# Patient Record
Sex: Female | Born: 1945
Health system: Southern US, Community
[De-identification: ages and names within clinical notes are randomized; demographics above are authoritative.]

## PROBLEM LIST (undated history)

## (undated) DIAGNOSIS — F329 Major depressive disorder, single episode, unspecified: Secondary | ICD-10-CM

## (undated) DIAGNOSIS — C801 Malignant (primary) neoplasm, unspecified: Secondary | ICD-10-CM

## (undated) DIAGNOSIS — I10 Essential (primary) hypertension: Secondary | ICD-10-CM

## (undated) DIAGNOSIS — G629 Polyneuropathy, unspecified: Secondary | ICD-10-CM

## (undated) DIAGNOSIS — N904 Leukoplakia of vulva: Secondary | ICD-10-CM

## (undated) DIAGNOSIS — J449 Chronic obstructive pulmonary disease, unspecified: Secondary | ICD-10-CM

## (undated) DIAGNOSIS — T7840XA Allergy, unspecified, initial encounter: Secondary | ICD-10-CM

## (undated) DIAGNOSIS — F32A Depression, unspecified: Secondary | ICD-10-CM

## (undated) DIAGNOSIS — I7300 Raynaud's syndrome without gangrene: Secondary | ICD-10-CM

## (undated) DIAGNOSIS — R519 Headache, unspecified: Secondary | ICD-10-CM

## (undated) DIAGNOSIS — I00 Rheumatic fever without heart involvement: Secondary | ICD-10-CM

## (undated) DIAGNOSIS — M1990 Unspecified osteoarthritis, unspecified site: Secondary | ICD-10-CM

## (undated) DIAGNOSIS — E039 Hypothyroidism, unspecified: Secondary | ICD-10-CM

## (undated) DIAGNOSIS — R51 Headache: Secondary | ICD-10-CM

## (undated) DIAGNOSIS — E785 Hyperlipidemia, unspecified: Secondary | ICD-10-CM

## (undated) DIAGNOSIS — K219 Gastro-esophageal reflux disease without esophagitis: Secondary | ICD-10-CM

## (undated) HISTORY — DX: Rheumatic fever without heart involvement: I00

## (undated) HISTORY — DX: Hypothyroidism, unspecified: E03.9

## (undated) HISTORY — DX: Leukoplakia of vulva: N90.4

## (undated) HISTORY — DX: Hyperlipidemia, unspecified: E78.5

## (undated) HISTORY — DX: Major depressive disorder, single episode, unspecified: F32.9

## (undated) HISTORY — DX: Chronic obstructive pulmonary disease, unspecified: J44.9

## (undated) HISTORY — DX: Gastro-esophageal reflux disease without esophagitis: K21.9

## (undated) HISTORY — DX: Polyneuropathy, unspecified: G62.9

## (undated) HISTORY — DX: Raynaud's syndrome without gangrene: I73.00

## (undated) HISTORY — DX: Allergy, unspecified, initial encounter: T78.40XA

## (undated) HISTORY — PX: TONSILLECTOMY: SUR1361

## (undated) HISTORY — PX: SPINE SURGERY: SHX786

## (undated) HISTORY — DX: Depression, unspecified: F32.A

## (undated) HISTORY — DX: Essential (primary) hypertension: I10

---

## 1976-09-17 HISTORY — PX: ABDOMINAL HYSTERECTOMY: SHX81

## 1998-09-17 HISTORY — PX: BACK SURGERY: SHX140

## 2000-09-17 DIAGNOSIS — E039 Hypothyroidism, unspecified: Secondary | ICD-10-CM

## 2000-09-17 HISTORY — DX: Hypothyroidism, unspecified: E03.9

## 2001-04-07 ENCOUNTER — Other Ambulatory Visit: Admission: RE | Admit: 2001-04-07 | Discharge: 2001-04-07 | Payer: Self-pay | Admitting: *Deleted

## 2001-05-08 ENCOUNTER — Encounter: Payer: Self-pay | Admitting: Neurosurgery

## 2001-05-08 ENCOUNTER — Inpatient Hospital Stay (HOSPITAL_COMMUNITY): Admission: RE | Admit: 2001-05-08 | Discharge: 2001-05-11 | Payer: Self-pay | Admitting: Neurosurgery

## 2001-08-06 ENCOUNTER — Encounter: Admission: RE | Admit: 2001-08-06 | Discharge: 2001-08-06 | Payer: Self-pay | Admitting: Neurosurgery

## 2001-08-06 ENCOUNTER — Encounter: Payer: Self-pay | Admitting: Neurosurgery

## 2001-11-05 ENCOUNTER — Encounter: Admission: RE | Admit: 2001-11-05 | Discharge: 2001-11-05 | Payer: Self-pay | Admitting: Neurosurgery

## 2001-11-05 ENCOUNTER — Encounter: Payer: Self-pay | Admitting: Neurosurgery

## 2002-01-30 ENCOUNTER — Ambulatory Visit (HOSPITAL_COMMUNITY): Admission: RE | Admit: 2002-01-30 | Discharge: 2002-01-30 | Payer: Self-pay | Admitting: Neurosurgery

## 2002-01-30 ENCOUNTER — Encounter: Payer: Self-pay | Admitting: Neurosurgery

## 2002-02-01 ENCOUNTER — Encounter: Payer: Self-pay | Admitting: Neurosurgery

## 2002-02-10 ENCOUNTER — Inpatient Hospital Stay (HOSPITAL_COMMUNITY): Admission: RE | Admit: 2002-02-10 | Discharge: 2002-02-14 | Payer: Self-pay | Admitting: Neurosurgery

## 2002-09-17 HISTORY — PX: COLONOSCOPY: SHX174

## 2002-10-06 ENCOUNTER — Ambulatory Visit (HOSPITAL_COMMUNITY): Admission: RE | Admit: 2002-10-06 | Discharge: 2002-10-06 | Payer: Self-pay | Admitting: Gastroenterology

## 2003-09-24 ENCOUNTER — Other Ambulatory Visit: Admission: RE | Admit: 2003-09-24 | Discharge: 2003-09-24 | Payer: Self-pay | Admitting: Family Medicine

## 2006-09-24 ENCOUNTER — Ambulatory Visit: Payer: Self-pay | Admitting: Family Medicine

## 2006-11-26 ENCOUNTER — Ambulatory Visit: Payer: Self-pay | Admitting: Family Medicine

## 2007-03-31 ENCOUNTER — Ambulatory Visit: Payer: Self-pay | Admitting: Family Medicine

## 2007-04-08 ENCOUNTER — Emergency Department: Payer: Self-pay | Admitting: Unknown Physician Specialty

## 2007-04-08 ENCOUNTER — Ambulatory Visit: Payer: Self-pay | Admitting: Vascular Surgery

## 2007-04-18 LAB — HM COLONOSCOPY

## 2007-05-01 ENCOUNTER — Ambulatory Visit: Payer: Self-pay | Admitting: Family Medicine

## 2007-08-04 ENCOUNTER — Ambulatory Visit: Payer: Self-pay | Admitting: Family Medicine

## 2007-11-06 ENCOUNTER — Ambulatory Visit: Payer: Self-pay | Admitting: Family Medicine

## 2007-12-08 ENCOUNTER — Ambulatory Visit: Payer: Self-pay | Admitting: Family Medicine

## 2008-03-08 ENCOUNTER — Ambulatory Visit: Payer: Self-pay | Admitting: Family Medicine

## 2008-06-03 ENCOUNTER — Ambulatory Visit: Payer: Self-pay | Admitting: Family Medicine

## 2008-07-29 ENCOUNTER — Ambulatory Visit: Payer: Self-pay | Admitting: Family Medicine

## 2008-09-29 ENCOUNTER — Ambulatory Visit: Payer: Self-pay | Admitting: Family Medicine

## 2008-11-30 ENCOUNTER — Encounter: Admission: RE | Admit: 2008-11-30 | Discharge: 2008-11-30 | Payer: Self-pay | Admitting: Family Medicine

## 2008-11-30 ENCOUNTER — Ambulatory Visit: Payer: Self-pay | Admitting: Family Medicine

## 2008-12-14 ENCOUNTER — Ambulatory Visit: Payer: Self-pay | Admitting: Family Medicine

## 2009-01-18 ENCOUNTER — Ambulatory Visit: Payer: Self-pay | Admitting: Family Medicine

## 2009-08-22 ENCOUNTER — Ambulatory Visit: Payer: Self-pay | Admitting: Family Medicine

## 2009-10-07 ENCOUNTER — Ambulatory Visit: Payer: Self-pay | Admitting: Family Medicine

## 2009-10-21 ENCOUNTER — Emergency Department: Payer: Self-pay | Admitting: Emergency Medicine

## 2009-10-25 ENCOUNTER — Other Ambulatory Visit: Admission: RE | Admit: 2009-10-25 | Discharge: 2009-10-25 | Payer: Self-pay | Admitting: Family Medicine

## 2009-10-25 ENCOUNTER — Ambulatory Visit: Payer: Self-pay | Admitting: Family Medicine

## 2009-10-25 LAB — HM PAP SMEAR: HM Pap smear: NORMAL

## 2010-01-31 ENCOUNTER — Ambulatory Visit: Payer: Self-pay | Admitting: Family Medicine

## 2010-04-19 ENCOUNTER — Ambulatory Visit: Payer: Self-pay | Admitting: Physician Assistant

## 2011-01-06 ENCOUNTER — Encounter: Payer: Self-pay | Admitting: Family Medicine

## 2011-01-16 DIAGNOSIS — N904 Leukoplakia of vulva: Secondary | ICD-10-CM

## 2011-01-16 HISTORY — DX: Leukoplakia of vulva: N90.4

## 2011-01-26 ENCOUNTER — Other Ambulatory Visit: Payer: Self-pay | Admitting: Family Medicine

## 2011-01-31 ENCOUNTER — Ambulatory Visit (INDEPENDENT_AMBULATORY_CARE_PROVIDER_SITE_OTHER): Payer: Medicare Other | Admitting: Family Medicine

## 2011-01-31 ENCOUNTER — Telehealth: Payer: Self-pay | Admitting: *Deleted

## 2011-01-31 ENCOUNTER — Other Ambulatory Visit: Payer: Self-pay | Admitting: Family Medicine

## 2011-01-31 ENCOUNTER — Encounter: Payer: Self-pay | Admitting: Family Medicine

## 2011-01-31 VITALS — BP 124/84 | HR 80 | Ht 65.0 in | Wt 145.0 lb

## 2011-01-31 DIAGNOSIS — G629 Polyneuropathy, unspecified: Secondary | ICD-10-CM

## 2011-01-31 DIAGNOSIS — M1990 Unspecified osteoarthritis, unspecified site: Secondary | ICD-10-CM

## 2011-01-31 DIAGNOSIS — E785 Hyperlipidemia, unspecified: Secondary | ICD-10-CM | POA: Insufficient documentation

## 2011-01-31 DIAGNOSIS — G589 Mononeuropathy, unspecified: Secondary | ICD-10-CM

## 2011-01-31 DIAGNOSIS — M129 Arthropathy, unspecified: Secondary | ICD-10-CM

## 2011-01-31 DIAGNOSIS — I10 Essential (primary) hypertension: Secondary | ICD-10-CM

## 2011-01-31 DIAGNOSIS — Z780 Asymptomatic menopausal state: Secondary | ICD-10-CM

## 2011-01-31 DIAGNOSIS — E119 Type 2 diabetes mellitus without complications: Secondary | ICD-10-CM

## 2011-01-31 DIAGNOSIS — Z0000 Encounter for general adult medical examination without abnormal findings: Secondary | ICD-10-CM

## 2011-01-31 DIAGNOSIS — R079 Chest pain, unspecified: Secondary | ICD-10-CM

## 2011-01-31 DIAGNOSIS — E782 Mixed hyperlipidemia: Secondary | ICD-10-CM

## 2011-01-31 DIAGNOSIS — J449 Chronic obstructive pulmonary disease, unspecified: Secondary | ICD-10-CM

## 2011-01-31 DIAGNOSIS — N899 Noninflammatory disorder of vagina, unspecified: Secondary | ICD-10-CM

## 2011-01-31 DIAGNOSIS — E039 Hypothyroidism, unspecified: Secondary | ICD-10-CM

## 2011-01-31 DIAGNOSIS — Z79899 Other long term (current) drug therapy: Secondary | ICD-10-CM

## 2011-01-31 LAB — CBC WITH DIFFERENTIAL/PLATELET
Basophils Absolute: 0.1 10*3/uL (ref 0.0–0.1)
Basophils Relative: 1 % (ref 0–1)
Eosinophils Absolute: 0.1 10*3/uL (ref 0.0–0.7)
Eosinophils Relative: 1 % (ref 0–5)
MCH: 29.1 pg (ref 26.0–34.0)
MCHC: 33.8 g/dL (ref 30.0–36.0)
MCV: 86.3 fL (ref 78.0–100.0)
Neutrophils Relative %: 69 % (ref 43–77)
Platelets: 360 10*3/uL (ref 150–400)
RBC: 5.25 MIL/uL — ABNORMAL HIGH (ref 3.87–5.11)
RDW: 13.3 % (ref 11.5–15.5)

## 2011-01-31 LAB — COMPREHENSIVE METABOLIC PANEL
ALT: 18 U/L (ref 0–35)
AST: 21 U/L (ref 0–37)
Albumin: 4.4 g/dL (ref 3.5–5.2)
CO2: 22 mEq/L (ref 19–32)
Calcium: 10.3 mg/dL (ref 8.4–10.5)
Chloride: 100 mEq/L (ref 96–112)
Creat: 0.68 mg/dL (ref 0.40–1.20)
Potassium: 4.3 mEq/L (ref 3.5–5.3)

## 2011-01-31 LAB — LIPID PANEL
Cholesterol: 281 mg/dL — ABNORMAL HIGH (ref 0–200)
LDL Cholesterol: 193 mg/dL — ABNORMAL HIGH (ref 0–99)
Triglycerides: 232 mg/dL — ABNORMAL HIGH (ref <150)
VLDL: 46 mg/dL — ABNORMAL HIGH (ref 0–40)

## 2011-01-31 LAB — POCT URINALYSIS DIPSTICK
Bilirubin, UA: NEGATIVE
Glucose, UA: NEGATIVE
Leukocytes, UA: NEGATIVE
Nitrite, UA: NEGATIVE
Urobilinogen, UA: NEGATIVE

## 2011-01-31 LAB — TSH: TSH: 0.775 u[IU]/mL (ref 0.350–4.500)

## 2011-01-31 MED ORDER — CELECOXIB 200 MG PO CAPS: 200.0000 mg | ORAL_CAPSULE | Freq: Two times a day (BID) | ORAL | Status: DC

## 2011-01-31 MED ORDER — NIACIN ER (ANTIHYPERLIPIDEMIC) 500 MG PO TBCR: 500.0000 mg | EXTENDED_RELEASE_TABLET | Freq: Every day | ORAL | Status: DC

## 2011-01-31 MED ORDER — ALBUTEROL SULFATE HFA 108 (90 BASE) MCG/ACT IN AERS: 2.0000 | INHALATION_SPRAY | RESPIRATORY_TRACT | Status: DC | PRN

## 2011-01-31 MED ORDER — BUDESONIDE-FORMOTEROL FUMARATE 160-4.5 MCG/ACT IN AERO: 2.0000 | INHALATION_SPRAY | Freq: Two times a day (BID) | RESPIRATORY_TRACT | Status: DC

## 2011-01-31 NOTE — Progress Notes (Signed)
Subjective:    Patient ID: [name] Day, female    DOB: [birth year], 65 y.o.   MRN: [id number]  HPI [name] Day is a 65 y.o. female who presents for a complete physical.  She has the following concerns: Chest pain, and follow up on chronic medical problems as documented below:   Immunization History  Administered Date(s) Administered  . Influenza Whole 05/29/2008  . Pneumococcal Conjugate 10/07/2009   Td 1/05 Last Pap smear: 2011  Last mammogram: > 5 years Last colonoscopy: 1/08 (Dr. Loreta Ave) Last DEXA: never Eye exam: 2 years ago Dentist: goes regularly for cleaning.  Recurrent problems with losing crown   Diabetes follow-up:  Blood sugars at home are running 130's.  Denies hypoglycemia.  Denies polydipsia and polyuria.  Some urinary frequency at night (drinks a lot of fluids during day)  Last eye exam was 2 years ago.  Patient follows a low sugar diet and checks feet regularly without concerns.  Hypertension follow-up:  Blood pressures are not checked elsewhere.  Denies dizziness, headaches.  Denies side effects of medications.  Complaining of some chest tightness, like a squeezing, and "funny heartbeat" like it "flip flops" especially at night when she lies down.  Chest pain began when her mother was in hospice 2 weeks ago, and when she died.  Mother died Feb 09, 2023, husband died almost a year ago (anniversary coming up soon).  She thinks her heart symptoms are related to anxiety.  Was Rx'd alprazolam 0.25mg  last year--helps her.  Uses 1/2 tab twice daily, but not needing every day. Had stress test at [facility] in 2010 which was normal  Past Medical History  Diagnosis Date  . Diabetes mellitus   . Hypertension   . Allergy   . COPD (chronic obstructive pulmonary disease)   . Hypothyroid 2002  . GERD (gastroesophageal reflux disease)   . Depression   . Hyperlipidemia   . Neuropathy     diabetic    Past Surgical History  Procedure Date  . Colonoscopy 2004  .  Back surgery 2000  . Abdominal hysterectomy 1978    History   Social History  . Marital Status: Widowed    Spouse Name: N/A    Number of Children: N/A  . Years of Education: N/A   Occupational History  . 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 Smoking status: Former Smoker    Quit date: 09/17/1993  . Smokeless tobacco: Never Used  . Alcohol Use: No  . Drug Use: No  . Sexually Active: Not on file   Other Topics Concern  . Not on file   Social History Narrative  . No narrative on file    Family History  Problem Relation Age of Onset  . Heart disease Mother     CHF and cardiomyopathy  . Thyroid disease Mother   . Hyperlipidemia Mother   . Hyperlipidemia Father   . Cancer Father     liver  . Lupus Sister   . Hyperlipidemia Sister     Current outpatient prescriptions:albuterol (PROAIR HFA) 108 (90 BASE) MCG/ACT inhaler, Inhale 2 puffs into the lungs every 4 (four) hours as needed for wheezing., Disp: 1 Inhaler, Rfl: 2;  ALPRAZolam (XANAX) 0.25 MG tablet, Take 0.25 mg by mouth 3 (three) times daily as needed.  , Disp: , Rfl: ;  budesonide-formoterol (SYMBICORT) 160-4.5 MCG/ACT inhaler, Inhale 2 puffs into the lungs 2 (two) times daily., Disp: 1 Inhaler, Rfl:  11 celecoxib (CELEBREX) 200 MG capsule, Take 1 capsule (200 mg total) by mouth 2 (two) times daily. Take 200 mg by mouth 2 (two) times daily., Disp: 60 capsule, Rfl: 3;  FLUoxetine (PROZAC) 40 MG capsule, Take 40 mg by mouth daily.  , Disp: , Rfl: ;  levothyroxine (SYNTHROID, LEVOTHROID) 100 MCG tablet, TAKE 1 TABLET BY MOUTH EVERY DAY, Disp: 30 tablet, Rfl: 3 lisinopril-hydrochlorothiazide (PRINZIDE,ZESTORETIC) 10-12.5 MG per tablet, Take 1 tablet by mouth daily.  , Disp: , Rfl: ;  metFORMIN (GLUCOPHAGE) 500 MG tablet, Take 500 mg by mouth 1 dose over 46 hours.  , Disp: , Rfl: ;  metoprolol (TOPROL-XL) 100 MG 24 hr tablet, Take 100 mg by mouth daily.  , Disp: , Rfl: ;  Multiple  Vitamins-Minerals (SENIOR MULTIVITAMIN PLUS) TABS, Take 1 tablet by mouth.  , Disp: , Rfl:  DISCONTD: Albuterol Sulfate (PROAIR HFA IN), Inhale 2 puffs into the lungs as needed.  , Disp: , Rfl: ;  DISCONTD: budesonide-formoterol (SYMBICORT) 160-4.5 MCG/ACT inhaler, Inhale 2 puffs into the lungs 2 (two) times daily.  , Disp: , Rfl: ;  DISCONTD: celecoxib (CELEBREX) 200 MG capsule, Take 200 mg by mouth 2 (two) times daily. , Disp: , Rfl: ;  DISCONTD: Multiple Vitamin (MULTIVITAMIN PO), Take by mouth.  , Disp: , Rfl:  niacin (NIASPAN) 500 MG CR tablet, Take 1 tablet (500 mg total) by mouth at bedtime., Disp: 30 tablet, Rfl: 0;  DISCONTD: rosuvastatin (CRESTOR) 5 MG tablet, Take 5 mg by mouth daily.  , Disp: , Rfl:   Allergies  Allergen Reactions  . Aspirin Other (See Comments)    GI  bleed  . Statins Other (See Comments)    LEG & ARM CRAMPS  . Codeine Rash    Review of Systems The patient denies anorexia, fever, weight changes, headaches,  vision changes, decreased hearing, ear pain, sore throat, breast concerns, dizziness, syncope, dyspnea on exertion, cough, swelling, nausea, vomiting, diarrhea, constipation, abdominal pain, melena, hematochezia, indigestion/heartburn, hematuria, incontinence, dysuria,vaginal discharge, odor or itch, genital lesions, joint pains, numbness, tingling, weakness, tremor, suspicious skin lesions, depression,  abnormal bleeding/bruising, or enlarged lymph nodes.  + ROS: occ hemorrhoid flare, chest pain, anxiety    Objective:   Physical Exam BP 124/84  Pulse 80  Ht 5\' 5"  (1.651 m)  Wt 145 lb (65.772 kg)  BMI 24.13 kg/m2  General Appearance:    Alert, cooperative, no distress, appears stated age  Head:    Normocephalic, without obvious abnormality, atraumatic  Eyes:    PERRL, conjunctiva/corneas clear, EOM's intact, fundi    benign  Ears:    Normal TM's and external ear canals  Nose:   Nares normal, mucosa normal, no drainage or sinus   tenderness  Throat:    Lips, mucosa, and tongue normal; teeth and gums normal  Neck:   Supple, no lymphadenopathy;  thyroid:  no   enlargement/tenderness/nodules; no carotid   bruit or JVD  Back:    Spine nontender, no curvature, ROM normal, no CVA     tenderness  Lungs:     Clear to auscultation bilaterally without wheezes, rales or     ronchi; respirations unlabored  Chest Wall:    No tenderness or deformity   Heart:    Regular rate and rhythm, S1 and S2 normal, no murmur, rub   or gallop  Breast Exam:    No tenderness, masses, or nipple discharge or inversion.      No axillary lymphadenopathy  Abdomen:  Soft, non-tender, nondistended, normoactive bowel sounds,    no masses, no hepatosplenomegaly  Genitalia:    Perineal area with hypo pigmentation, and some thickening with area of recent bleed posteriorly.  Normal bimanual exam--no palpable masses (uterus and adnexa absent)  Rectal:    Normal tone, no masses or tenderness; guaiac negative stool  Extremities:   No clubbing, cyanosis or edema  Pulses:   2+ and symmetric all extremities  Skin:   Skin color, texture, turgor normal, no rashes or lesions. Some callouses noted bilaterally, and decreased sensation to monofilament in stocking pattern--sensation spared at the heels, absent at toes through mid foot  Lymph nodes:   Cervical, supraclavicular, and axillary nodes normal  Neurologic:   CNII-XII intact, normal strength, and gait; reflexes 2+ and symmetric throughout. Neuropathy as per above          Psych:   Normal mood, affect, hygiene and grooming.          Assessment & Plan:   1. Routine general medical examination at a health care facility  POCT urinalysis dipstick, POCT HgB A1C, Vitamin D 25 hydroxy, EKG, Visual acuity screening  2. Type II or unspecified type diabetes mellitus without mention of complication, not stated as uncontrolled  POCT HgB A1C, Comprehensive metabolic panel, Urine Microalbumin w/creat. ratio   controlled  3. COPD (chronic  obstructive pulmonary disease)  CBC with Differential, budesonide-formoterol (SYMBICORT) 160-4.5 MCG/ACT inhaler, albuterol (PROAIR HFA) 108 (90 BASE) MCG/ACT inhaler   stable  4. Mixed hyperlipidemia  Lipid panel   intolerant of statins, Zetia. Tolerated Niacin, but requires ASA to prevent side effect--pt with h/o ulcers from ASA, not on PPI  5. Essential hypertension, benign     controlled  6. Unspecified hypothyroidism  TSH  7. Encounter for long-term (current) use of other medications  Comprehensive metabolic panel  8. Arthritis  celecoxib (CELEBREX) 200 MG capsule  9. Chest pain  EKG   normal stress test 08/2009; EKG unchanged from 08/2009, showing some LAE.  Likely related to anxiety, not CAD  10. Postmenopausal  DG Bone Density  11. Vaginal disorder  Ambulatory referral to Obstetrics / Gynecology   perineal skin is concerning--refer to GYN for eval/treatment (and/or biopsy)  12. Neuropathy  Ambulatory referral to Podiatry   refer to podiatry in this diabetic patient with loss of sensation in feet, and callous formation   Hyperlipidemia--hasn't tolerated many statins or Zetia in past.  Never tried Welchol.  Has tried Niaspan and tolerated, but needs to take aspirin with in order to prevent side effects.  H/o bleeding ulcer with aspirin in the past.  Recommend re-start niaspan, take coated aspirin prior, and to start PPI on a daily basis to prevent ulcers from the aspirin (ie. Prilosec OTC or Prevacid).  Stop if develops abdominal pain, blood in stool.  Consider higher dose PPI if having some GI side effects  Discussed monthly self breast exams and yearly mammograms after the age of 40--strongly encouraged her to schedule mammo (past due); at least 30 minutes of aerobic activity at least 5 days/week; proper sunscreen use reviewed; healthy diet, including goals of calcium and vitamin D intake and alcohol recommendations (less than or equal to 1 drink/day) reviewed; regular seatbelt use;  changing batteries in smoke detectors.  Immunization recommendations discussed. She had shingles last year, has had twice.  Recommend vaccine at pharmacy--rx written. Colonoscopy recommendations reviewed--likely due again next year  Visit length: over an hour

## 2011-01-31 NOTE — Telephone Encounter (Signed)
[facility] OZ:[id number] for DEXA. Representative stated that the current DX listed is not one that insurance will pay for, she states that [id number], menopausal, osteopenia or osteoporosis will work. Can you change this in the order please? Thanks so much!

## 2011-01-31 NOTE — Telephone Encounter (Signed)
I don't know how to change dx in the referral.  I added "postmenopausal" to visit diagnoses in encounter, but couldn't figure out how to change in referral.  Please change to this dx for her DEXA.  Thanks!

## 2011-02-01 ENCOUNTER — Other Ambulatory Visit: Payer: Self-pay | Admitting: *Deleted

## 2011-02-01 ENCOUNTER — Telehealth: Payer: Self-pay | Admitting: *Deleted

## 2011-02-01 DIAGNOSIS — Z1231 Encounter for screening mammogram for malignant neoplasm of breast: Secondary | ICD-10-CM

## 2011-02-01 DIAGNOSIS — E119 Type 2 diabetes mellitus without complications: Secondary | ICD-10-CM

## 2011-02-01 LAB — MICROALBUMIN / CREATININE URINE RATIO

## 2011-02-01 NOTE — Telephone Encounter (Signed)
Called pt, no answer.left message for her to return my call re:lab results. vs

## 2011-02-01 NOTE — Progress Notes (Addendum)
microlbumin sent to lab 02/01/11, not sent on 01/31/11 due to lab error

## 2011-02-01 NOTE — Telephone Encounter (Signed)
Spoke with patient she has started her niaspan 500 mg as of yesterday. She is sch for lab only appt fasting 03/02/11 at 10:00am

## 2011-02-02 LAB — MICROALBUMIN / CREATININE URINE RATIO
Creatinine, Urine: 88.4 mg/dL
Microalb Creat Ratio: 5.7 mg/g (ref 0.0–30.0)
Microalb, Ur: 0.5 mg/dL (ref 0.00–1.89)

## 2011-02-02 NOTE — Discharge Summary (Signed)
[place]  Patient:    [name], [name] Visit Number: [id number] MRN: [id number]          Service Type: Attending:  Clydene Fake, M.D. Dictated by:   Clydene Fake, M.D. Adm. Date:  05/08/01 Disc. Date: 05/11/01                             Discharge Summary  ADMISSION DIAGNOSIS:  Spondylolisthesis, L5-S1, and lateral recess stenosis.  DISCHARGE DIAGNOSIS:  Spondylolisthesis, L5-S1, and lateral recess stenosis.  PROCEDURES:  Gill decompressive laminectomy at L5, posterior interbody fusion of L5-S1, ______ interbody cages, pedicle screw fixation at L5-S1, open reduction and internal fixation of spondylolisthesis, posterolateral fusion of L5-S1, and autograft of same incision.  REASON FOR ADMISSION:  The patient is a 65 year old woman who since September of 1999 has been having severe back and leg problems, but has been gradually getting worse over the period of time.  The back pain is severe and fairly constant and worsening with any activity or sitting or standing too long. Those activities along bring leg pain worse on the left side running down the S1 distribution.  X-rays with flexion and extension show grade 1-2 spondylolisthesis with bilateral pars defects.  No clear abnormal motion for flexion and extension.  There is progression of spondylolisthesis over time. MRI shows the disk collapse, the pseudodisk herniation, and the lateral recess stenosis with severe foraminal narrowing.  The patient was brought in for decompression and fusion.  HOSPITAL COURSE:  The patient was admitted on the day of surgery and underwent the procedures above without complications.  Noted intraoperatively a large disk herniation that was seen on the left side compressing the S1 root which was quite significant for her S1 component of pain.  Postoperatively, she was transferred to the recovery room and then to the floor.  She has done extremely well.   Right away it was noted that she had no leg problems.  Back pain has always fairly moderate and well controlled with a PCA for the first couple of days and now minimal pain pills after that.  She has been up ambulating and doing extremely well getting the brace on and off.  The incisions remain dry and intact.  She had a little postoperative ileus.  That has resolved with positive flatus.  She still has had no bowel movement, but is eating well.  DISPOSITION:  The patient will be discharged home in stable condition.  DISCHARGE MEDICATIONS:  Same as prehospitalization, except no nonsteroidal anti-inflammatories, Percocet one to two p.o. q.4-6h. p.r.n. (51 pills given), and Flexeril 250 mg p.o. q.8h. p.r.n. spasms.  DIET:  As tolerated.  ACTIVITY:  Keep the incision dry for five days.  Will keep the brace on when up.  No strenuous activity.  FOLLOW-UP:  Will be in three weeks in my office. Dictated by:   Clydene Fake, M.D. Attending:  Clydene Fake, M.D. DD:  05/11/01 TD:  05/12/01 Job: [id number]

## 2011-02-02 NOTE — Op Note (Signed)
[place]. [facility]  Patient:    [name] Day, [name] Day Visit Number: [id number] MRN: [id number]          Service Type: SUR Location: [id number] Attending Physician:  Colon Branch Proc. Date: 05/08/01 Adm. Date:  05/08/2001                             Operative Report  PREOPERATIVE DIAGNOSES:  Spondylolisthesis at L5-S1, lateral recess stenosis.  POSTOPERATIVE DIAGNOSES:  Spondylolisthesis at L5-S1, lateral recess stenosis.  PROCEDURES:  Gill-type decompressive laminectomy at L5, posterior lumbar interbody fusion at L5-S1, Brantigan interbody cages at L5-S1, pedicle screw fixation (nonsegmental) at L5-S1, open reduction and internal fixation of the spondylolisthesis, posterolateral fusion L5-S1, autograft same incision, and allograft.  SURGEON:  Clydene Fake, M.D.  ASSISTANT:  Izell Bath. Elesa Hacker, M.D.  ANESTHESIA:  General endotracheal tube anesthesia.  ESTIMATED BLOOD LOSS:  300 cc.  BLOOD GIVEN:  None.  DRAINS:  None.  COMPLICATIONS:  None.  REASON FOR PROCEDURE:  Patient is a 65 year old woman who since 1999 has been having some back and leg pain that has been worsening over time.  Back is severe and pretty much constant, worsening with any types of activity, and leg pain seems to come on with activity.  MRI and x-rays were done showing grade 1-2 spondylolisthesis at L5-S1.  There is not much _____ with flexion-extension, but things have worsened over the years.  MRI was done showing the spondylolisthesis, disk herniation, and severe foraminal and lateral stenosis due to the spondylolisthesis.  Patient brought in for decompression and fusion.  DESCRIPTION OF PROCEDURE:  The patient was brought in the operating room, and general anesthesia was induced.  Patient was placed in a prone position on the Wilson frame with all pressure points padded.  The patient was prepped and draped in a sterile fashion, and site of incision was injected with  20 cc 1% lidocaine with epinephrine.  Incision was then made from L4 to S1 spinous process and the incision taken down to the fascia.  Fascia was incised with the Bovie, and subperiosteal dissection was done over the L4, 5, and S1 spinous processes and laminae out to the facets bilaterally.  Fluoroscopic imaging was used to confirm all levels.  Dissection was taken out lateral so that the L5 transverse process was found bilaterally along with getting it to the lateral space beyond the 5-1 facet.  Again after confirming levels with fluoroscopy, an L5 decompressive laminectomy was done.  his was a Gill-type procedure, due to the spondylolisthesis and the lateral pars defect, removing that whole superior facet bilaterally, having decompressed the soft tissue under the four laminae and also took off the top of the sacral lamina.  We took dissection out laterally, and getting the hypertrophic ligament and bone spurring and opened up the foramen over the 5 roots bilaterally.  The 5 roots were extremely pinned in.  The foramen was very tight.  Medial facetectomy was then performed on the inferior facets, uncovering the lateral disk space. Disk space was then incised bilaterally and diskectomy performed with pituitary rongeurs.  There was a large disk herniation seen on the left side, which was pushing into the S1 root on that side.  We then used interspace spreaders to start spreading the 5-1 interspace, and we distracted up to 11 mm, and this reduced greatly the spondylolisthesis.  We continued working on the diskectomy, and then we used the  [place] cage set for the shavers and then the progressive reamer was used bilaterally to prepare the interspace for cages.  Thirteen high by 11 wide by 21 mm long Brantigan cages were then prepared.  The bone that was removed during the laminectomy was cleaned, chopped up into small pieces.  To this [place] putty was added.  This mixture was then packed  into the [place] cages.  This bone mixture was placed into the disk space anteriorly, and then the cages were tapped into place, first on the right and then the left.  Fluoroscopic imaging was used to confirm the positioning and was used during the use of the broaches at this point as the drill was used to decorticate the L5 transverse process and lateral facet along with the lateral facet of S1 and the top of the sacrum laterally on both sides.  The entry points for the L5 and S1 pedicles were found and decorticated with the high-speed drill.  The pedicle probe was then placed down the L5 pedicle, first on the left, then on the right, using fluoroscopic imaging as a guide.  The hole was tapped and then pedicle screws were placed in the L5 level, 40 mm long x 6.25 wide Monarch variable-axis screws were placed.  The S1 pedicles were found with the probe and then tapped, the screws placed just like L5, though 7 mm wide x 30 mm long screws were used.  Two 45 mm rods were prelordosed, were placed into the screw heads, one on each side, and the locking nuts were placed.  We tightened the L5 tightening screw over the rod and then with compression over the 5-1 interspace, then tightened the tightening screw over S1.  We finally tightened all these screws.  AP and lateral fluoroscopic imaging was obtained showing good position of the pedicle screws, rods, interbody cages, and good disk space restoration and reduction of the spondylolisthesis.  The wound was irrigated with antibiotic solution, and then the rest of the bone mixture along with 10 cc of [place] allograft bone was then placed into the lateral gutters over the L5 transverse process, down to the lateral sacrum bilaterally for a posterolateral fusion.  Gelfoam and thrombin was placed in the epidural space, retractors were removed, hemostasis obtained with Bovie cauterization, and the paraspinous muscles and fascia were each closed with 0  Vicryl interrupted sutures.  The wound was irrigated with antibiotic solution some more, and then 0, 2-0, and 3-0 Vicryl interrupted sutures were used to close the subcutaneous tissue.  Skin was  closed with Steri-Strips.  A dressing was placed.  Patient was placed back into a supine position, awoken from anesthesia, and transferred to the recovery room in stable condition. Attending Physician:  Colon Branch DD:  05/08/01 TD:  05/09/01 Job: [id number]

## 2011-02-02 NOTE — Op Note (Signed)
NAME:  [name] Day, [name] Day                      ACCOUNT NO.:  [id number]   MEDICAL RECORD NO.:  [id number]                   PATIENT TYPE:  AMB   LOCATION:  ENDO                                 FACILITY:  [facility]   PHYSICIAN:  Anselmo Rod, M.D.               DATE OF BIRTH:  04/11/1946   DATE OF PROCEDURE:  10/06/2002  DATE OF DISCHARGE:                                 OPERATIVE REPORT   PROCEDURE PERFORMED:  Colonoscopy.   ENDOSCOPIST:  Charna Elizabeth, M.D.   INSTRUMENT USED:  Pediatric adjustable Olympus colonoscope.   INDICATIONS FOR PROCEDURE:  The patient is a 65 year old white female with a  family history of colon cancer in her mother and personal history of rectal  bleeding.  Rule out colonic polyps, masses, etc.   PREPROCEDURE PREPARATION:  Informed consent was procured from the patient.  The patient was fasted for eight hours prior to the procedure and prepped  with a bottle of Gatorade and a bottle of MiraLax the night prior to the  procedure.   PREPROCEDURE PHYSICAL:  The patient had stable vital signs.  Neck supple.  Chest clear to auscultation.  S1 and S2 regular.  Abdomen soft with normal  bowel sounds.   DESCRIPTION OF PROCEDURE:  The patient was placed in left lateral decubitus  position and sedated with 60 mg of Demerol and 10 mg of Versed  intravenously.  Once the patient was adequately sedated and maintained on  low flow oxygen and continuous cardiac monitoring, the Olympus video  colonoscope was advanced from the rectum to the cecum with difficulty.  There were small internal hemorrhoids seen on retroflexion and a few left-  sided diverticula appreciated.  The rest of the colonic mucosa appeared  healthy and normal.  The appendiceal orifice and ileocecal valve were  clearly visualized and photographed.  The patient had some hives on her hand  after injection of Demerol and Versed and therefore the patient was switched  to Fentanyl 25 mcg given intravenous  in addition to the Demerol previously  given to her and Benadryl 25 mg was given as well.   IMPRESSION:  1. Small nonbleeding internal hemorrhoids.  2. A few left-sided diverticula.  3. Normal-appearing proximal left colon, transverse colon and right colon     including cecum.   RECOMMENDATIONS:  1. A high fiber diet is recommended for the patient.  2.     Outpatient follow-up in the next two weeks for further recommendation.  3. Repeat colorectal cancer screening in the next five years unless the     patient develops any abnormal symptoms in the interim.                                                   Jyothi Nat  Loreta Ave, M.D.    JNM/MEDQ  D:  10/06/2002  T:  10/06/2002  Job:  [id number]   cc:   Talmadge Coventry, M.D.  [address]  [place]  [place] [address]  Fax: [phone]

## 2011-02-02 NOTE — H&P (Signed)
. [facility]  Patient:    [name] Day, [name] Day Visit Number: [id number] MRN: [id number]          Service Type: Attending:  Clydene Fake, M.D. Adm. Date:  05/08/01                           History and Physical  DATE OF BIRTH:  [birth year]  CHIEF COMPLAINT:  Back and leg pain.  HISTORY OF PRESENT ILLNESS:  The patient is a 65 year old, right-handed woman who since September of 1999 has been having back and leg problems. It has been worsening through this period of time. Back pain is severe and pretty constant with pain radiating down into the legs towards the heel and bottom of the foot. Sitting or standing too long or doing any type of activity makes the pain worse. She has been on multiple anti-inflammatories with no relief. She has been on various pain medications and physical therapy but currently is having problems doing the exercises with the increased pain. No bowel or bladder incontinence. She does have numbness that goes down the legs also. Workup has included x-rays with flexion and extension and MRI of the lumbar spine. These show a grade 1-2 spondylolithesis at 5-1 with bilateral pars defects with no clear abnormal motion there but pretty good disk collapse. There is some lateral stenosis due to the spondylolithesis and foraminal narrowing. The patient will be admitted for decompression and fusion.  PAST MEDICAL HISTORY:  Significant for hypertension, thyroid disease, arthritis, increased cholesterol, and depression.  PAST SURGICAL HISTORY:  Tonsillectomy in 1962, hysterectomy in 1976.  CURRENT MEDICATIONS: 1. Trazodone 150 mg q.h.s. 2. Levoxyl 0.175 mg q.d. 3. Norvasc 10 mg q.d. 4. Cozaar 25 mg q.d. 5. Prozac 20 mg q.o.d. 6. Premarin 0.625 mg q.d. 7. Hydrochlorothiazide 25 mg q.d. 8. Ultram p.r.n.  ALLERGIES:  No known drug allergies.  SOCIAL HISTORY:  She says she does not smoke or drink alcohol. She is married. She is  a Futures trader.  REVIEW OF SYSTEMS:  Otherwise negative.  FAMILY HISTORY:  Noncontributory.  PHYSICAL EXAMINATION:  GENERAL:  The patient is in mild distress.  VITAL SIGNS:  Weight is 142, height 5 feet 6 inches, blood pressure 162/101, pulse 69, temperature 97.7.  HEENT:  Unremarkable.  NECK:  Supple.  LUNGS:  Clear.  HEART:  Regular rate and rhythm.  ABDOMEN:  Soft, nontender.  EXTREMITIES:  Intact with no edema.  BACK AND NEUROLOGICAL:  Range of motion of back is decreased in flexion and extension. It is worse in extension with increasing back and leg pain. She does have some mild paraspinous muscle tenderness. No clear spasms. No tenderness over the hip bursa. Gait is down with a stiff back. Straight leg raising is positive on the right and left with the unilateral S1 radicular symptoms. Motor strength shows 5/5 in all muscle groups. Sensation is slightly decreased in the S1 distribution to light touch and pinprick, a little worse on the left side than on the right. L5 may be slightly decreased in pinprick also. Otherwise intact. Deep tendon reflexes are at 3 at the knees, and ankles equal bilaterally.  ASSESSMENT AND PLAN:  The patient with grade 1 to 2 spondylolithesis at L5-S1 with biforaminal narrowing and lateral stenosis. The patient will be admitted for decompressive lumbar laminectomy and posterior lumbar interbody fusion with pedicle screws and interbody cages. Attending:  Clydene Fake, M.D.  DD:  05/07/01  TD:  05/07/01 Job: [id number]

## 2011-02-02 NOTE — Op Note (Signed)
[place]. [facility]  Patient:    GLENNIE, BOSE Visit Number: [id number] MRN: [id number]          Service Type: SUR Location: 3000 [id number] Attending Physician:  Colon Branch Dictated by:   Clydene Fake, M.D. Proc. Date: 02/10/02 Admit Date:  02/10/2002                             Operative Report  PREOPERATIVE DIAGNOSIS:  Pseudomeningocele with cauda equina compression.  POSTOPERATIVE DIAGNOSES: 1. Pseudomeningocele with cauda equina compression. 2. Intradural cyst (possible arachnoid _____ part of the pseudomeningocele.  PROCEDURES: 1. L3-5 laminectomy with drainage of pseudomeningocele. 2. Intradural resection of the cyst with decompression of cauda equina. 3. Primary dural closure with suture and fibrin glue. 4. Microdissection with microscope.  SURGEON:  Clydene Fake, M.D.  ASSISTANT:  Izell Milligan. Elesa Hacker, M.D.  ANESTHESIA:  General endotracheal tube anesthesia.  ESTIMATED BLOOD LOSS:  100 cc.  BLOOD GIVEN:  None.  DRAINS:  None.  COMPLICATIONS:  None.  REASON FOR PROCEDURE:  The patient is a 65 year old woman who is one year status post L5-S1 fusion, who has had sacral and perineal pain, maybe some urgency on urination and increased frequency.  She had a myelogram performed showing cauda equina compression from it looks like a pseudomeningocele preop. The patient is brought for decompression and repair.  DESCRIPTION OF PROCEDURE:  The patient was brought in the operating room, and general anesthesia was induced.  The patient was placed in a prone position no the Wilson frame with all pressure points padded.  The site of incision was injected with 10 cc of 1% lidocaine with epinephrine.  The incision was made then at the site of the previous incision plus extended cephalad 2-3 cm.  The incision taken down through the fascia and hemostasis obtained with Bovie cauterization.  The spinous processes of 3 and 4 were palpated,  and subperiosteal dissection was done at the L3 and L4 spinous processes and as we got to the 4, we hit the pseudomeningocele and spinal fluid was seen.  We then dissected down through the pseudomeningocele cavity down to the dura.  There was a hole going down under the L4 lamina.  The right side of L3 and all the L4 laminectomy was then performed with Leksell rongeurs and Kerrison punches. Under the lamina looked like we had normal dura, and we opened this dura and were looking at a cystic cavity that surrounded and compressed all the nerve roots of the cauda equina.  As we extended our dural incision cephalad, we then actually got through into the normal subarachnoid space.  We simply had a cyst, intradural pseudomeningocele, or arachnoid cyst that closed off and was compressing the nerve roots.  This cyst had an inferior border, and we opened the dura down over the sacral area until we found the caudal aspect of the cyst.  We then opened up the cyst at the cephalad and caudal areas and peeled away a lot of the cyst wall.  When we did this, we peeled away and opened the cyst over the nerve roots and as we did that, the nerve roots spread out and started coming into the field.  We were not able to peel all the cyst wall away because we wanted to protect the nerve root function, so we made sure we opened up all the compression, and all nerve roots again were decompressed.  We then addressed the cephalad-caudal areas of the cyst, making sure we adequately removed enough of the cyst wall so that we could try to stop this from reforming.  We then closed the dura with 4-0 Nurolon running suture.  The wound was irrigated with antibiotic solution.  Dura was coated with Surgicel and then fibrin glue.  Retractors were removed.  Paraspinous muscles were closed with 0 Vicryl interrupted sutures, the fascia was closed with 0 Ethibond running locking suture, and the subcutaneous tissue was closed  with 0, 2-0, and 3-0 Vicryl interrupted suture and the skin closed with benzoin and Steri-Strips.  Note:  The microscope was used for microdissection to decompress all the nerve roots and peel away cyst wall in what was a fairly tedious procedure.  After the benzoin and Steri-Strips placed, dressing placed.  The patient was placed back in a supine position, awoken from anesthesia, and transferred to the recovery room. Dictated by:   Clydene Fake, M.D. Attending Physician:  Colon Branch DD:  02/10/02 TD:  02/11/02 Job: [id number]

## 2011-02-02 NOTE — H&P (Signed)
[place]. [facility]  Patient:    [name] Day, [name] Day Visit Number: [id number] MRN: [id number]          Service Type: SUR Location: 3000 3036 01 Attending Physician:  Colon Branch Dictated by:   Clydene Fake, M.D. Admit Date:  02/10/2002                           History and Physical  CHIEF COMPLAINT:  Sacral and perineal pain with urinary frequency.  HISTORY:  The patient is a 65 year old woman who is one year status post L5-S1 fusion with pedicle screws and instrumentation, again one year ago.  She has done great until the last few months, noticing severe sacral pain that radiates into the perineal area and has also noticed some frequency of urination and kind of urgency with bowel movements, though she has not been incontinent of either.  MRI was done showing a near-myelographic block at L4-5 with a late filling of the surrounding area and CT was done and this shows a pseudomeningocele at this level that does compress all the nerve roots of the cauda equina.  The patient is brought in for decompression and resection and repair of the pseudomeningocele.  PAST MEDICAL HISTORY:  Significant for hypertension, thyroid disease, arthritis, increased cholesterol, depression.  PAST SURGICAL HISTORY:  Past surgery includes the lumbar laminectomy and fusion as mentioned above, tonsillectomy in 1962, hysterectomy in 1976.  CURRENT MEDICATIONS:  Premarin, Cozaar, Norvasc, hydrochlorothiazide, Levoxyl, fluoxetine, vitamin E, atenolol, multivitamin, Neurontin.  DRUG ALLERGIES:  CODEINE and LATEX.  SOCIAL HISTORY:  She does not smoke or drink alcohol.  She is married.  REVIEW OF SYSTEMS:  Otherwise negative.  FAMILY HISTORY:  Otherwise negative.  PHYSICAL EXAMINATION:  GENERAL:  The patient is pleasant, in no apparent distress.  HEENT:  Unremarkable.  NECK:  Supple.  LUNGS:  Clear.  HEART:  Regular rhythm.  ABDOMEN:  Soft and  nontender.  EXTREMITIES:  Intact.  No edema.  BACK AND NEUROLOGIC:  Exam shows negative straight leg raising, motor strength intact, sensation intact, gait normal, good range of motion of the back, well-healed incision posteriorly.  ASSESSMENT AND PLAN:  A patient with pseudomeningocele causing cauda equina compression.  The patient will be admitted for a decompression and fusion. Dictated by:   Clydene Fake, M.D. Attending Physician:  Colon Branch DD:  02/10/02 TD:  02/11/02 Job: [id number]

## 2011-02-02 NOTE — Discharge Summary (Signed)
[place]. [facility]  Patient:    [name] Day, [name] Day Visit Number: [id number] MRN: [id number]          Service Type: SUR Location: 3000 3036 01 Attending Physician:  Colon Branch Dictated by:   Cristi Loron, M.D. Admit Date:  02/10/2002 Discharge Date: 02/14/2002                             Discharge Summary  For full details of this admission, please refer to the typed history and physical.  HISTORY OF PRESENT ILLNESS:  The patient is a 65 year old woman who is one year status post an L5-S1 fusion who has sacral and peroneal pain with some urgency on urination, increased frequency of urination.  She had a myelogram which demonstrated cauda equina compression from a pseudomeningocele.  The patient was brought in for decompression and repair.  For full details of this admission, please refer to the typed history and physical.  HOSPITAL COURSE:  Dr. Phoebe Perch admitted the patient to [facility] on Feb 10, 2002, with a diagnosis of pseudomeningocele and cauda equina compression with intradural cyst.  On the day of admission he performed an L3 to L5 laminectomy with drainage of pseudomeningocele and intradural resection of the cyst with decompression of the cauda equina and primary dural closure with suture and fibrin glue microdissection using microscope.  (For further details of this operation, please refer to the typed operative note).  POSTOPERATIVE COURSE:  The patients postoperative course was essentially unremarkable.  She remained flat for a few days and then was allowed to get up out of the bed.  By Feb 14, 2002, the patient was afebrile, vital signs stable.  She was eating well, ambulating well.  She had no headache, and she was requesting discharge to home.  She did complain of some oral thrush which she got on a previous operation and requested medication for this.  DISCHARGE INSTRUCTIONS:  The patient was given written  discharge instructions. She was instructed to resume her outpatient medical regimen.  FOLLOW-UP:  Instructed to follow up with Dr. Phoebe Perch.  DISCHARGE MEDICATIONS:  Discharge prescriptions are: 1. Lortab 10 #60 1 p.o. q.4h. p.r.n. for pain, no refill. 2. Mycostatin solution 5 cc swish and swallow t.i.d. x 3 days. 3. Valium 5 mg #50 1 p.o. q.6h. p.r.n. for muscle spasms.  FINAL DIAGNOSES: 1. Pseudomeningocele with cauda equina compression. 2. Intradural cyst. 3. Possibly arachnoid cyst.  PROCEDURES:  L3 to L5 laminectomy with drainage of pseudomeningocele, intradural resection of the cyst, decompression of the cauda equina, primary closure with microdissection. Dictated by:   Cristi Loron, M.D. Attending Physician:  Colon Branch DD:  02/14/02 TD:  02/16/02 Job: [id number]

## 2011-02-05 ENCOUNTER — Other Ambulatory Visit: Payer: Self-pay | Admitting: Family Medicine

## 2011-02-20 ENCOUNTER — Other Ambulatory Visit: Payer: Self-pay | Admitting: Family Medicine

## 2011-02-21 ENCOUNTER — Ambulatory Visit
Admission: RE | Admit: 2011-02-21 | Discharge: 2011-02-21 | Disposition: A | Discharge status: Home / Self Care | Payer: Medicare Other | Source: Ambulatory Visit | Attending: Family Medicine | Admitting: Family Medicine

## 2011-02-21 DIAGNOSIS — Z1231 Encounter for screening mammogram for malignant neoplasm of breast: Secondary | ICD-10-CM

## 2011-02-21 DIAGNOSIS — Z780 Asymptomatic menopausal state: Secondary | ICD-10-CM

## 2011-02-22 ENCOUNTER — Telehealth: Payer: Self-pay | Admitting: *Deleted

## 2011-02-22 NOTE — Telephone Encounter (Signed)
Called patient to inform her that dexa results were abnormal. Pt sch appt with DrKnapp for 03/01/11 at 10:30. Pt has lab visit sch for 03/02/11, so i told ger we could draw them when she came in for the OV on the 14th.

## 2011-03-01 ENCOUNTER — Ambulatory Visit (INDEPENDENT_AMBULATORY_CARE_PROVIDER_SITE_OTHER): Payer: Medicare Other | Admitting: Family Medicine

## 2011-03-01 ENCOUNTER — Encounter: Payer: Self-pay | Admitting: Family Medicine

## 2011-03-01 VITALS — BP 130/80 | HR 72 | Ht 64.5 in | Wt 144.0 lb

## 2011-03-01 DIAGNOSIS — E782 Mixed hyperlipidemia: Secondary | ICD-10-CM

## 2011-03-01 DIAGNOSIS — M899 Disorder of bone, unspecified: Secondary | ICD-10-CM

## 2011-03-01 DIAGNOSIS — M949 Disorder of cartilage, unspecified: Secondary | ICD-10-CM

## 2011-03-01 DIAGNOSIS — M8580 Other specified disorders of bone density and structure, unspecified site: Secondary | ICD-10-CM

## 2011-03-01 DIAGNOSIS — Z79899 Other long term (current) drug therapy: Secondary | ICD-10-CM

## 2011-03-01 LAB — LIPID PANEL
Cholesterol: 245 mg/dL — ABNORMAL HIGH (ref 0–200)
VLDL: 46 mg/dL — ABNORMAL HIGH (ref 0–40)

## 2011-03-01 LAB — HEPATIC FUNCTION PANEL
ALT: 29 U/L (ref 0–35)
Bilirubin, Direct: 0.1 mg/dL (ref 0.0–0.3)
Indirect Bilirubin: 0.4 mg/dL (ref 0.0–0.9)

## 2011-03-01 MED ORDER — IBANDRONATE SODIUM 150 MG PO TABS: 150.0000 mg | ORAL_TABLET | ORAL | Status: DC

## 2011-03-01 NOTE — Progress Notes (Signed)
Subjective:    Patient ID: [name] Day, female    DOB: 1946/07/02, 65 y.o.   MRN: [id number]  HPI Patient presents to discuss bone density results.  Hyperlipidemia follow-up:  Patient was unable to tolerate the Niaspan, even with 325mg  of aspirin, felt like her body was burning up, skin was very red, felt like hot needles.  She restarted Crestor 10mg  (she had samples at home), and has been taking this for about 4 weeks without any apparent side effects. Patient is reportedly following a low-fat, low cholesterol diet.  Patient is fasting today for repeat labs.  Patient saw the gynecologist.  Felt that she likely has lichen sclerosis, and she was given a steroid cream.  She is to follow up with them in July to see if there is improvement, versus needing a biopsy.  Past Medical History  Diagnosis Date  . Diabetes mellitus   . Hypertension   . Allergy   . COPD (chronic obstructive pulmonary disease)   . Hypothyroid 2002  . GERD (gastroesophageal reflux disease)   . Depression   . Hyperlipidemia   . Neuropathy     diabetic  . Lichen sclerosus et atrophicus of the vulva 01/2011    Past Surgical History  Procedure Date  . Colonoscopy 2004  . Back surgery 2000  . Abdominal hysterectomy 1978    History   Social History  . Marital Status: Widowed    Spouse Name: N/A    Number of Children: N/A  . Years of Education: N/A   Occupational History  . 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 Smoking status: Former Smoker    Quit date: 09/17/1993  . Smokeless tobacco: Never Used  . Alcohol Use: No  . Drug Use: No  . Sexually Active: Not on file   Other Topics Concern  . Not on file   Social History Narrative  . No narrative on file    Family History  Problem Relation Age of Onset  . Heart disease Mother     CHF and cardiomyopathy  . Thyroid disease Mother   . Hyperlipidemia Mother   . Hyperlipidemia Father   . Cancer  Father     liver  . Lupus Sister   . Hyperlipidemia Sister     Current outpatient prescriptions:albuterol (PROAIR HFA) 108 (90 BASE) MCG/ACT inhaler, Inhale 2 puffs into the lungs every 4 (four) hours as needed for wheezing., Disp: 1 Inhaler, Rfl: 2;  budesonide-formoterol (SYMBICORT) 160-4.5 MCG/ACT inhaler, Inhale 2 puffs into the lungs daily.  , Disp: , Rfl:  celecoxib (CELEBREX) 200 MG capsule, Take 1 capsule (200 mg total) by mouth 2 (two) times daily. Take 200 mg by mouth 2 (two) times daily., Disp: 60 capsule, Rfl: 3;  FLUoxetine (PROZAC) 40 MG capsule, Take 40 mg by mouth daily.  , Disp: , Rfl: ;  gabapentin (NEURONTIN) 300 MG capsule, TAKE ONE CAPSULE BY MOUTH 3 TIMES A DAY, Disp: 90 capsule, Rfl: 0 levothyroxine (SYNTHROID, LEVOTHROID) 100 MCG tablet, TAKE 1 TABLET BY MOUTH EVERY DAY, Disp: 30 tablet, Rfl: 3;  lisinopril-hydrochlorothiazide (PRINZIDE,ZESTORETIC) 10-12.5 MG per tablet, TAKE ONE TABLET BY MOUTH ONCE A DAY, Disp: 30 tablet, Rfl: prn;  metFORMIN (GLUCOPHAGE) 500 MG tablet, TAKE 1 TABLET BY MOUTH EVERY DAY, Disp: 30 tablet, Rfl: 3;  metoprolol (TOPROL-XL) 100 MG 24 hr tablet, Take 100 mg by mouth daily.  , Disp: , Rfl:  Multiple Vitamins-Minerals (SENIOR MULTIVITAMIN PLUS) TABS, Take 1  tablet by mouth.  , Disp: , Rfl: ;  rosuvastatin (CRESTOR) 10 MG tablet, Take 10 mg by mouth daily.  , Disp: , Rfl: ;  DISCONTD: budesonide-formoterol (SYMBICORT) 160-4.5 MCG/ACT inhaler, Inhale 2 puffs into the lungs 2 (two) times daily., Disp: 1 Inhaler, Rfl: 11;  ALPRAZolam (XANAX) 0.25 MG tablet, Take 0.25 mg by mouth 3 (three) times daily as needed.  , Disp: , Rfl:  ibandronate (BONIVA) 150 MG tablet, Take 1 tablet (150 mg total) by mouth every 30 (thirty) days. Take in the morning with a full glass of water, on an empty stomach, and do not take anything else by mouth or lie down for the next 30 min., Disp: 1 tablet, Rfl: 0;  niacin (NIASPAN) 500 MG CR tablet, Take 1 tablet (500 mg total) by mouth  at bedtime., Disp: 30 tablet, Rfl: 0  Allergies  Allergen Reactions  . Aspirin Other (See Comments)    GI  bleed  . Statins Other (See Comments)    LEG & ARM CRAMPS  . Codeine Rash    Review of Systems Denies chest pain.  No true dysphagia, but sometimes has some throat/chest discomfort, sometimes has difficulty swallowing certain things.  Never has trouble swallowing pills.  No leg cramps, hot flashes, or other complaints    Objective:   Physical Exam BP 130/80  Pulse 72  Ht 5' 4.5" (1.638 m)  Wt 144 lb (65.318 kg)  BMI 24.34 kg/m2 Exam was limited to discussion, and review of DEXA results, FRAX score (see DEXA report)       Assessment & Plan:   1. Osteopenia     risks/benefits of treating osteopenia (with elevated FRAX); elects to try Boniva.  Sample given  2. Encounter for long-term (current) use of other medications  Hepatic function panel  3. Mixed hyperlipidemia  Lipid Profile   Tolerating Crestor--check labs today    Extended discussion and interpretation of DEXA and FRAX results, and discussion of available medications.  Patient reports frequent heartburn and throat discomfort related to the Symbicort, and sometimes has difficulty swallowing certain things. Discussed the risks and side effects of all bisphosphonates as well as Evista.  Elects to try a once monthly bisphosphonate.  Unwilling to do injections/infusions at this time.  Given Sample of Boniva--to call for Rx if tolerates.  Repeat DEXA in 2 years.  Calcium 1500mg  and Vitamin D 1000 IU daily recommended.  Hyperlipidemia: If LDL not at goal with Crestor 10mg , will need to increase to 20mg .  Pt advised that if she doesn't tolerate the 20mg , to go back to 10 by cutting tablet in 1/2.  She should contact us if not tolerating the higher dose.  Will send Rx (either 10 or 20mg ) based on lab results.   [facility] in [place], [place]

## 2011-03-01 NOTE — Patient Instructions (Signed)
Osteoporosis Osteoporosis is a disease of the bones that makes them weaker and prone to break (fracture). By their mid-30s, most people begin to gradually lose bone strength. If this is severe enough, osteoporosis may occur. Osteopenia is a less severe weakness of the bones, which places you at risk for osteoporosis. It is important to identify if you have osteoporosis or osteopenia. Bone fractures from osteoporosis (especially hip and spine fractures) are a major cause of hospitalization, loss of independence, and can lead to life-threatening complications. CAUSES There are a number of causes and risk factors:  Gender. Women are at a higher risk for osteoporosis than men.   Age. Bone formation slows down with age.   Ethnicity. For unclear reasons, white and Asian women are at higher risk for osteoporosis. Hispanic and African American women are at increased, but lesser, risk.   Family history of osteoporosis can mean that you are at a higher risk for getting it.   History of bone fractures indicates you may be at higher risk of another.   Calcium is very important for bone health and strength. Not enough calcium in your diet increases your risk for osteoporosis. Vitamin D is important for calcium metabolism. You get vitamin D from sunlight, foods, or supplements.   Physical activity. Bones get stronger with weight-bearing exercise and weaker without use.   Smoking is associated with decreased bone strength.   Medicines. Cortisone medicines, too much thyroid medicine, some cancer and seizure medicines, and others can weaken bones and cause osteoporosis.   Decreased body weight is associated with osteoporosis. The small amount of estrogen-type molecules produced in fat cells seems to protect the bones.   Menopausal decrease in the hormone estrogen can cause osteoporosis.   Low levels of the hormone testosterone can cause osteoporosis.   Some medical conditions can lead to osteoporosis  (hyperthyroidism, hyperparathyroidism, B12 deficiency).  SYMPTOMS Usually, no symptoms are felt as the bones weaken. The first symptoms are generally related to bone fractures. You may have silent, tiny bone fractures, especially in your spine. This can cause height loss and forward bending of the spine (kyphosis). DIAGNOSIS You or your caregiver may suspect osteoporosis based on height loss and kyphosis. Osteoporosis or osteopenia may be identified on an X-ray done for other reasons. A bone density measurement will likely be taken. Your bones are often measured at your lower spine or your hips. Measurement is done by an X-ray called a DEXA scan, or sometimes by a computerized X-ray scan (CT or CAT scan). Other tests may be done to find the cause of osteoporosis, such as blood tests to measure calcium and vitamin D, or to monitor treatment. TREATMENT The goal of osteoporosis treatment is to prevent fractures. This is done through medicine and home care treatments. Treatment will slow the weakening of your bones and strengthen them where possible. Measures to decrease the likelihood of falling and fracturing a bone are also important. Medicine  You may need supplements if you are not getting enough calcium, vitamin D, and vitamin B12.   If you are female and menopausal, you should discuss the option of estrogen replacement or estrogen-like medicine with your caregiver.   Medicines can be taken by mouth or injection to help build bone strength. When taken by mouth, there are important directions that you need to follow.   Calcitonin is a hormone made by the thyroid gland that can help build bone strength and decrease fracture risk in the spine. It can be taken by  nasal spray or injection.   Parathyroid hormone can be injected to help build bone strength.   You will need to continue to get enough calcium intake with any of these medicines.  FALL PREVENTION  If you are unsteady on your feet, use a  cane, walker, or walk with someone's help.   Remove loose rugs or electrical cords from your home.   Keep your home well lit at night. Use glasses if you need them.   Avoid icy streets and wet or waxed floors.   Hold the railing when using stairs.   Watch out for your pets.   Install grab bars in your bathroom.   Exercise. Physical activity, especially weight-bearing exercise, helps strengthen bones. Strength and balance exercise, such as tai chi, helps prevent falls.   Alcohol and some medicines can make you more likely to fall. Discuss alcohol use with your caregiver. Ask your caregiver if any of your medicines might increase your risk for falling. Ask if safer alternatives are available.  HOME CARE INSTRUCTIONS  Try to prevent and avoid falls.   To pick up objects, bend at the knees. Do not bend with your back.   Do not smoke. If you smoke, ask for help to stop.   Have adequate calcium and vitamin D in your diet. Talk with your caregiver about amounts.   Before exercising, ask your caregiver what exercises will be good for you.   Only take over-the-counter or prescription medicines for pain, discomfort, or fever as directed by your caregiver.  SEEK MEDICAL CARE IF:  You have had a fracture and your pain is not controlled.   You have had a fracture and you are not able to return to activities as expected.   You are reinjured.   You develop side effects from medicines, especially stomach pain or trouble swallowing.   You develop new, unexplained problems.  SEEK IMMEDIATE MEDICAL CARE IF:  You develop sudden, severe pain in your back.   You develop pain after an injury or fall.  Document Released: 06/13/2005 Document Re-Released: 02/21/2010 [facility] Patient Information 2011 [place], [place].

## 2011-03-02 ENCOUNTER — Other Ambulatory Visit: Payer: Medicare Other

## 2011-03-02 ENCOUNTER — Telehealth: Payer: Self-pay | Admitting: *Deleted

## 2011-03-02 MED ORDER — ROSUVASTATIN CALCIUM 20 MG PO TABS: 20.0000 mg | ORAL_TABLET | Freq: Every day | ORAL | Status: DC

## 2011-03-02 NOTE — Telephone Encounter (Addendum)
Message copied by Dorthula Perfect on Fri Mar 02, 2011 10:40 AM ------      Message from: Joselyn Arrow      Created: Fri Mar 02, 2011 10:09 AM       Advise patient of lipid results.  A little better, but still TG and LDL high.  I recommend increasing Crestor to 20mg  as we discussed at visit.  I already sent Rx for the 20mg  to her pharmacy--please let pt know.  Thanks!     Pt notified of lipid results.  Informed pt that rx for Crestor 20 mg was sent to pharmacy.  CM, LPN

## 2011-03-02 NOTE — Progress Notes (Signed)
Addended byJoselyn Arrow on: 03/02/2011 10:08 AM   Modules accepted: Orders

## 2011-03-26 ENCOUNTER — Other Ambulatory Visit: Payer: Self-pay | Admitting: Family Medicine

## 2011-04-08 ENCOUNTER — Other Ambulatory Visit: Payer: Self-pay | Admitting: Family Medicine

## 2011-04-25 ENCOUNTER — Other Ambulatory Visit: Payer: Self-pay | Admitting: Family Medicine

## 2011-04-25 MED ORDER — ALPRAZOLAM 0.25 MG PO TABS: 0.2500 mg | ORAL_TABLET | Freq: Three times a day (TID) | ORAL | Status: DC | PRN

## 2011-04-30 ENCOUNTER — Other Ambulatory Visit: Payer: Self-pay | Admitting: Family Medicine

## 2011-04-30 MED ORDER — ALPRAZOLAM 0.25 MG PO TABS: 0.2500 mg | ORAL_TABLET | Freq: Three times a day (TID) | ORAL | Status: DC | PRN

## 2011-05-03 ENCOUNTER — Telehealth: Payer: Self-pay | Admitting: *Deleted

## 2011-05-03 NOTE — Telephone Encounter (Signed)
OV is recommended.  I can't really make assessment/give advice, without examining her.  If she is no longer experiencing chest pain, likely can wait and be seen by another provider tomorrow.  There are some cardiac conditions that can be viral, but often times people mistake angina and heart attacks for indigestion.  Clearly with the fevers, she has some sort of acute illness going on. If still feeling sick, needs eval

## 2011-05-03 NOTE — Telephone Encounter (Signed)
Patient called and states that last Friday 04/27/11 she started feeing slightly ill, slight ST. Saturday morning she woke up with a "bone breaking" fever and could even get up off the couch. Sunday she felt slightly better but on Monday woke up still feeling sick, not quite right. Monday evening she states the she had THE worst indigestion that she had ever experienced in her life, she also experienced pressure in her chest as well as a feeling like "stingers" going down her arms. She took some omeprazole and got no relief. States she just hasn't felt quite right since. She wanted to know what you would advise as her friend that is an [employer] fundraiser said that her symptoms were probably cardiac. Patient states that she was wondering if her symptoms were just viral as opposed to cardiac. Please advise. Thanks.

## 2011-05-10 ENCOUNTER — Other Ambulatory Visit: Payer: Self-pay

## 2011-05-10 MED ORDER — ALPRAZOLAM 0.25 MG PO TABS: 0.2500 mg | ORAL_TABLET | Freq: Three times a day (TID) | ORAL | Status: DC | PRN

## 2011-05-14 ENCOUNTER — Ambulatory Visit (INDEPENDENT_AMBULATORY_CARE_PROVIDER_SITE_OTHER): Payer: Medicare Other | Admitting: Family Medicine

## 2011-05-14 ENCOUNTER — Encounter: Payer: Self-pay | Admitting: Family Medicine

## 2011-05-14 DIAGNOSIS — I10 Essential (primary) hypertension: Secondary | ICD-10-CM

## 2011-05-14 DIAGNOSIS — W57XXXA Bitten or stung by nonvenomous insect and other nonvenomous arthropods, initial encounter: Secondary | ICD-10-CM

## 2011-05-14 DIAGNOSIS — T148XXA Other injury of unspecified body region, initial encounter: Secondary | ICD-10-CM

## 2011-05-14 DIAGNOSIS — E782 Mixed hyperlipidemia: Secondary | ICD-10-CM

## 2011-05-14 DIAGNOSIS — M8580 Other specified disorders of bone density and structure, unspecified site: Secondary | ICD-10-CM

## 2011-05-14 DIAGNOSIS — Z23 Encounter for immunization: Secondary | ICD-10-CM

## 2011-05-14 DIAGNOSIS — F411 Generalized anxiety disorder: Secondary | ICD-10-CM

## 2011-05-14 DIAGNOSIS — R5383 Other fatigue: Secondary | ICD-10-CM

## 2011-05-14 DIAGNOSIS — E039 Hypothyroidism, unspecified: Secondary | ICD-10-CM

## 2011-05-14 DIAGNOSIS — T148 Other injury of unspecified body region: Secondary | ICD-10-CM

## 2011-05-14 DIAGNOSIS — M899 Disorder of bone, unspecified: Secondary | ICD-10-CM

## 2011-05-14 DIAGNOSIS — R509 Fever, unspecified: Secondary | ICD-10-CM

## 2011-05-14 DIAGNOSIS — R5381 Other malaise: Secondary | ICD-10-CM

## 2011-05-14 MED ORDER — ALPRAZOLAM 0.25 MG PO TABS: 0.2500 mg | ORAL_TABLET | Freq: Three times a day (TID) | ORAL | Status: DC | PRN

## 2011-05-14 NOTE — Patient Instructions (Addendum)
Increase fish oil to 4000 mg daily.  Consider Welchol for getting LDL to goal of <100   xanax--not to use nightly for insomnia, but for anxiety.  Consider OTC agents like Benadryl or Simply Sleep, and/or Melatonin to help you sleep  Please check your blood pressures at the pharmacy.  If they are running higher than 140/90, then you need to come back to the office to address this and have your medications changed. Bring your list of BP's checked at the pharmacy to all your visits

## 2011-05-14 NOTE — Progress Notes (Signed)
05/05/11 she had "breakbone fever" (didn't check with thermometer), bad headache, and severe chest pain. Felt like something was sitting on her chest.  Thought it was indigestion, took chewable antacids which didn't help.  Then took OTC Omeprazole, but it didn't seem to ease up for many hours (began at 8pm, improved by 4am).  Started at rest.  She had a rash on her torso 8/19-20, macular.  Nonpruritic. She just hasn't felt well since then.  She's been feeling more lethargic, having headaches daily, frontally.  Some allergy symptoms, just mild.    8/12 noticed an engorged tick.  Raised red area at site of tick bite remained for about 5 days, then resolved.  Never looked like a target lesion.  Was pruritic.  States she removed entire tick, leaving no remaining parts inside.  Denies sore throat.  No further fevers (just 8/18 and lowgrade on 8/19).  Denies any nausea, vomiting, diarrhea.  No further chest pain. Sometimes has some shortness of breath and slight chest pain with exercise, mainly going up hills.  She attributes this to her lungs/breathing.  She walks every day.  Not sleeping well at night.  Reports a lot of stress, trying to wrap things up since her mother and husband died.  Has been out of her xanax for a while, and is requesting a refill.    Patient never started the [facility] as recommended at her last visit. She isn't taking Niaspan, hasn't in a while, due to flushing, despite the aspirin.  We increased her Crestor from 10 to 20mg  after her last lipid panel (LDL 150's, TG 229), but she states she got myalgias after increasing the dose, and went back down to 1/2 tablet (so, is back to taking the 10mg  of Crestor daily.)  Didn't try taking it along with Coenzyme Q10  Past Medical History  Diagnosis Date  . Diabetes mellitus   . Hypertension   . Allergy   . COPD (chronic obstructive pulmonary disease)   . Hypothyroid 2002  . GERD (gastroesophageal reflux disease)   . Depression   .  Hyperlipidemia   . Neuropathy     diabetic  . Lichen sclerosus et atrophicus of the vulva 01/2011    Past Surgical History  Procedure Date  . Colonoscopy 2004  . Back surgery 2000  . Abdominal hysterectomy 1978    History   Social History  . Marital Status: Widowed    Spouse Name: N/A    Number of Children: N/A  . Years of Education: N/A   Occupational History  . 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 Smoking status: Former Smoker    Quit date: 09/17/1993  . Smokeless tobacco: Never Used  . Alcohol Use: No  . Drug Use: No  . Sexually Active: Not on file   Other Topics Concern  . Not on file   Social History Narrative  . No narrative on file    Family History  Problem Relation Age of Onset  . Heart disease Mother     CHF and cardiomyopathy  . Thyroid disease Mother   . Hyperlipidemia Mother   . Hyperlipidemia Father   . Cancer Father     liver  . Lupus Sister   . Hyperlipidemia Sister     Current outpatient prescriptions:albuterol (PROAIR HFA) 108 (90 BASE) MCG/ACT inhaler, Inhale 2 puffs into the lungs every 4 (four) hours as needed for wheezing., Disp: 1 Inhaler, Rfl: 2;  budesonide-formoterol (  SYMBICORT) 160-4.5 MCG/ACT inhaler, Inhale 2 puffs into the lungs daily.  , Disp: , Rfl:  celecoxib (CELEBREX) 200 MG capsule, Take 1 capsule (200 mg total) by mouth 2 (two) times daily. Take 200 mg by mouth 2 (two) times daily., Disp: 60 capsule, Rfl: 3;  FLUoxetine (PROZAC) 40 MG capsule, Take 40 mg by mouth daily.  , Disp: , Rfl: ;  gabapentin (NEURONTIN) 300 MG capsule, TAKE ONE CAPSULE BY MOUTH 3 TIMES A DAY, Disp: 90 capsule, Rfl: 0 levothyroxine (SYNTHROID, LEVOTHROID) 100 MCG tablet, TAKE 1 TABLET BY MOUTH EVERY DAY, Disp: 30 tablet, Rfl: 3;  lisinopril-hydrochlorothiazide (PRINZIDE,ZESTORETIC) 10-12.5 MG per tablet, TAKE ONE TABLET BY MOUTH ONCE A DAY, Disp: 30 tablet, Rfl: 0;  metFORMIN (GLUCOPHAGE) 500 MG tablet, TAKE  1 TABLET BY MOUTH EVERY DAY, Disp: 30 tablet, Rfl: 3;  metoprolol (TOPROL-XL) 100 MG 24 hr tablet, Take 100 mg by mouth daily.  , Disp: , Rfl:  Multiple Vitamins-Minerals (SENIOR MULTIVITAMIN PLUS) TABS, Take 1 tablet by mouth.  , Disp: , Rfl: ;  rosuvastatin (CRESTOR) 20 MG tablet, Take 10 mg by mouth daily.  , Disp: , Rfl: ;  ALPRAZolam (XANAX) 0.25 MG tablet, Take 1 tablet (0.25 mg total) by mouth 3 (three) times daily as needed., Disp: 30 tablet, Rfl: 0 ibandronate (BONIVA) 150 MG tablet, Take 1 tablet (150 mg total) by mouth every 30 (thirty) days. Take in the morning with a full glass of water, on an empty stomach, and do not take anything else by mouth or lie down for the next 30 min., Disp: 1 tablet, Rfl: 0;  niacin (NIASPAN) 500 MG CR tablet, Take 1 tablet (500 mg total) by mouth at bedtime., Disp: 30 tablet, Rfl: 0  Allergies  Allergen Reactions  . Aspirin Other (See Comments)    GI  bleed  . Statins Other (See Comments)    LEG & ARM CRAMPS  . Codeine Rash   ROS:  See extensive HPI:  Denies current myalgias, fevers, rashes. GI complaints.  PHYSICAL EXAM: BP 162/88  Pulse 68  Temp 98.3 F (36.8 C)  Ht 5' 4.5" (1.638 m)  Wt 146 lb (66.225 kg)  BMI 24.67 kg/m2 Well appearing female in no distress.  Just mildly anxious Some throat-clearing noted during visit HEENT: PERRL, EOMI, conjunctiva and sclera clear.  TM's and EAC's normal.  Nasal mucosa mildly edematous, no purulence.  Sinuses nontender.  Tender at temporalis muscles bilaterally.  Temporal arteries nontender Neck: no lymphadenopathy or thyromegaly Heart: regular rate and rhythm Lungs: clear bilaterally Abdomen: soft, nontender, no organomegaly or mass Extremities: no cyanosis, clubbing or edema, good pulses Skin: no rash.  Area of tick bite--L lateral lower leg--no rash Psych:  Somewhat flat affect, normal mood  ASSESSMENT/PLAN: 1. Fever  B. burgdorfi antibodies, Rocky mtn spotted fvr abs pnl(IgG+IgM)  2. Tick bite   B. burgdorfi antibodies, [place] mtn spotted fvr abs pnl(IgG+IgM)  3. Other malaise and fatigue  CBC with Differential, Comprehensive metabolic panel, PR ELECTROCARDIOGRAM, COMPLETE, PR ELECTROCARDIOGRAM, COMPLETE  4. Unspecified hypothyroidism  TSH  5. Anxiety state, unspecified  ALPRAZolam (XANAX) 0.25 MG tablet, PR ELECTROCARDIOGRAM, COMPLETE, PR ELECTROCARDIOGRAM, COMPLETE  6. Need for prophylactic vaccination and inoculation against influenza  Flu vaccine greater than or equal to 3yo preservative free IM  7. Essential hypertension, benign     BP's elevated today. Recommended checking BP elsewhere and f/u if persistently elevated  8. Osteopenia     never started the bisphosphonate; will consider in future.  Discussed potential risks of untreated osteoporosis  9. Mixed hyperlipidemia     Didn't tolerate 20mg  Crestor.  Recommend retrying along with CoQ10.  Also to increase fish oil to 3000-4000 mg daily.  Re-check as scheduled  Chest Pain--description of symptoms very concerning for MI, but occurred during acute illness with fever, and symptoms were 2 weeks ago, and haven't recurred. Check baseline EKG--showed borderline LAE, NSR rate 62, no other significant abnormalities noted.  No ischemia (patient asymptomatic currently).  Patient understands to present to ER if has recurrent similar chest symptoms  Tick bite--fevers, rash and headache occurred a few days after tickbite.  At this point, will just check titers for RMSF and Lyme disease, since she didn't present during the acute illness.  Will treat if results abnormal.  Insomnia, related to stress.  Will refill xanax--not to use prn insomnia, but for anxiety.  Consider OTC agents like Benadryl or Simply Sleep, and/or Melatonin  Hyperlipidemia, mixed--she is unwilling to do anything different at this point.  Discussed goals (LDL<100 for diabetes, TG<150).  Consider Welchol--not interested currently. Is willing to try increasing the fish oil to  3000-4000 mg daily, and might consider increasing the Crestor back to 20mg  and taking Coenzyme Q10 along with it to see if she tolerates the higher dose better that way.  HTN--BP elevated today, likely contributed to poor sleep, not feeling well.  Check BP's elsewhere, and f/u if remain elevated   Total visit time 40 minutes face to face

## 2011-05-15 LAB — CBC WITH DIFFERENTIAL/PLATELET
Eosinophils Absolute: 0.2 10*3/uL (ref 0.0–0.7)
Eosinophils Relative: 2 % (ref 0–5)
HCT: 45.1 % (ref 36.0–46.0)
Lymphs Abs: 2.7 10*3/uL (ref 0.7–4.0)
MCH: 28.9 pg (ref 26.0–34.0)
MCV: 85.7 fL (ref 78.0–100.0)
Monocytes Absolute: 0.6 10*3/uL (ref 0.1–1.0)
Monocytes Relative: 6 % (ref 3–12)
Platelets: 318 10*3/uL (ref 150–400)
RBC: 5.26 MIL/uL — ABNORMAL HIGH (ref 3.87–5.11)

## 2011-05-15 LAB — COMPREHENSIVE METABOLIC PANEL
Albumin: 4.4 g/dL (ref 3.5–5.2)
BUN: 18 mg/dL (ref 6–23)
CO2: 23 mEq/L (ref 19–32)
Calcium: 9.8 mg/dL (ref 8.4–10.5)
Chloride: 102 mEq/L (ref 96–112)
Creat: 0.66 mg/dL (ref 0.50–1.10)
Glucose, Bld: 89 mg/dL (ref 70–99)

## 2011-05-15 LAB — ROCKY MTN SPOTTED FVR ABS PNL(IGG+IGM)
RMSF IgG: 0.35 IV
RMSF IgM: 0.76 IV

## 2011-05-15 LAB — B. BURGDORFI ANTIBODIES: B burgdorferi Ab IgG+IgM: 0.15 {ISR}

## 2011-05-16 ENCOUNTER — Telehealth: Payer: Self-pay | Admitting: *Deleted

## 2011-05-16 NOTE — Telephone Encounter (Signed)
Spoke with patient and gave her lab results.

## 2011-05-18 ENCOUNTER — Other Ambulatory Visit: Payer: Self-pay | Admitting: Family Medicine

## 2011-05-22 ENCOUNTER — Other Ambulatory Visit: Payer: Self-pay | Admitting: Family Medicine

## 2011-05-30 ENCOUNTER — Other Ambulatory Visit: Payer: Self-pay | Admitting: Family Medicine

## 2011-06-19 ENCOUNTER — Other Ambulatory Visit: Payer: Self-pay | Admitting: Family Medicine

## 2011-06-19 DIAGNOSIS — E119 Type 2 diabetes mellitus without complications: Secondary | ICD-10-CM

## 2011-06-19 NOTE — Telephone Encounter (Signed)
Is this ok?

## 2011-06-27 ENCOUNTER — Telehealth: Payer: Self-pay | Admitting: Family Medicine

## 2011-06-27 DIAGNOSIS — F411 Generalized anxiety disorder: Secondary | ICD-10-CM

## 2011-06-27 MED ORDER — FLUOXETINE HCL 40 MG PO CAPS: 40.0000 mg | ORAL_CAPSULE | Freq: Every day | ORAL | Status: DC

## 2011-06-27 NOTE — Telephone Encounter (Signed)
E-rx'd refill

## 2011-07-02 ENCOUNTER — Other Ambulatory Visit: Payer: Self-pay | Admitting: Family Medicine

## 2011-07-05 ENCOUNTER — Other Ambulatory Visit: Payer: Self-pay | Admitting: Family Medicine

## 2011-07-05 NOTE — Telephone Encounter (Signed)
Is this okay to refill? 

## 2011-07-05 NOTE — Telephone Encounter (Signed)
Refill request

## 2011-07-05 NOTE — Telephone Encounter (Signed)
approved

## 2011-07-31 ENCOUNTER — Other Ambulatory Visit: Payer: Self-pay | Admitting: Family Medicine

## 2011-09-20 ENCOUNTER — Other Ambulatory Visit: Payer: Self-pay | Admitting: Family Medicine

## 2011-09-20 NOTE — Telephone Encounter (Signed)
Is this okay to refill? 

## 2011-09-20 NOTE — Telephone Encounter (Signed)
Ok x 1 refill only.  She is past due for f/u (due for f/u DM in November)

## 2011-09-20 NOTE — Telephone Encounter (Signed)
Please have patient call for appt for future refills.

## 2011-10-10 ENCOUNTER — Encounter: Payer: Self-pay | Admitting: Internal Medicine

## 2011-10-11 ENCOUNTER — Ambulatory Visit (INDEPENDENT_AMBULATORY_CARE_PROVIDER_SITE_OTHER): Payer: Medicare Other | Admitting: Family Medicine

## 2011-10-11 ENCOUNTER — Encounter: Payer: Self-pay | Admitting: Family Medicine

## 2011-10-11 VITALS — BP 129/82 | HR 80 | Ht 64.5 in | Wt 148.0 lb

## 2011-10-11 DIAGNOSIS — E119 Type 2 diabetes mellitus without complications: Secondary | ICD-10-CM

## 2011-10-11 DIAGNOSIS — J449 Chronic obstructive pulmonary disease, unspecified: Secondary | ICD-10-CM

## 2011-10-11 DIAGNOSIS — E1142 Type 2 diabetes mellitus with diabetic polyneuropathy: Secondary | ICD-10-CM

## 2011-10-11 DIAGNOSIS — E1149 Type 2 diabetes mellitus with other diabetic neurological complication: Secondary | ICD-10-CM

## 2011-10-11 DIAGNOSIS — H6120 Impacted cerumen, unspecified ear: Secondary | ICD-10-CM

## 2011-10-11 DIAGNOSIS — E1140 Type 2 diabetes mellitus with diabetic neuropathy, unspecified: Secondary | ICD-10-CM

## 2011-10-11 DIAGNOSIS — J309 Allergic rhinitis, unspecified: Secondary | ICD-10-CM

## 2011-10-11 DIAGNOSIS — E782 Mixed hyperlipidemia: Secondary | ICD-10-CM

## 2011-10-11 LAB — POCT GLYCOSYLATED HEMOGLOBIN (HGB A1C): Hemoglobin A1C: 5.4

## 2011-10-11 MED ORDER — GABAPENTIN 300 MG PO CAPS: ORAL_CAPSULE | ORAL | Status: DC

## 2011-10-11 MED ORDER — FLUTICASONE PROPIONATE 50 MCG/ACT NA SUSP: 2.0000 | Freq: Every day | NASAL | Status: DC

## 2011-10-11 NOTE — Patient Instructions (Signed)
Neuropathy--try increasing gabapentin evening dose to 600mg  (ie. Take 1 in the morning, 1 mid-day, and 2 at bedtime).,  Referring to podiatry for evaluation  Hyperlipidemia, mixed.--Try increasing fish oil to 3000-4000 mg daily.  Restart Crestor at 10mg  once a day for a week.  Try taking Coenzyme Q10 along with it. If doing well in a week (ie no muscle aches), then increase to 20mg  every other day (three times/week)--taking 20 mg (full tablet) on Monday, Wed and Friday, and take 1/2 tablet (10mg ) on all other days.  If pain recurs, then try cutting back the 20 mg to just twice weekly (ie Mon and Thurs).  Plan to recheck cholesterol in 2 months  Allergies: start Flonase.  Consider also using Mucinex if having a lot of sinus pressure or cough from postnasal drip.  You may use Claritin or Zyrtec daily, if needed.  Avoid decongestants  COPD:  Stable overall.  Due for PFT's--referring to pulmonary for testing  Balance may be related to allergies, and/or neuropathy.  We will discuss more at your follow-up, after treating for allergies

## 2011-10-11 NOTE — Progress Notes (Signed)
Patient presents for med check.  Hyperlipidemia--dose of Crestor was increased from 10 to 20mg  after last labs 6 months ago.  Did well initially, but started having body aches in upper and lower extremities about 2 weeks ago, so she stopped taking the Crestor.  Body aches have since resolved.  Didn't recall having any problems on the 10 mg, but LDL was well above goal at 157.  Also had elevated TG at last check.  She is taking just 1 fish oil daily.  Previously didn't tolerate Niaspan and other statins.  Diabetes--taking metformin daily.  Sugars are running 135-140 two hours after a meal, 95 in the mornings.  Checks feet regularly, and notes a callus on her left foot.  Has numbness and neuropathy. Describes pain and a tingly pain, which sometimes wakes her up at night.  The neurontin seems to help some, but still having symptoms.  Hypertension--compliant with taking medications. Doesn't check BP elsewhere.  Denies headaches or dizziness  Had a few falls in the last couple of weeks.  Walks with a cane at home, especially in the yard, because she is having some balance issues when not on completely level ground.  Larey Seat once when in yard, and once in her house-- she got off balance, grabbed onto dresser to prevent fall.  Didn't fall, but a heavy mirror fell onto her head.  She is having some chronic sinus problems, and a plugging sensation intermittently in her right ear.  Denies ear popping, tinnitus. + sniffling, sneezing.  Only takes Claritin as needed.  Saline spray isn't helping.  +itchy and watery eyes.  Past Medical History  Diagnosis Date  . Diabetes mellitus   . Hypertension   . Allergy   . COPD (chronic obstructive pulmonary disease)   . Hypothyroid 2002  . GERD (gastroesophageal reflux disease)   . Depression   . Hyperlipidemia   . Neuropathy     diabetic  . Lichen sclerosus et atrophicus of the vulva 01/2011    Past Surgical History  Procedure Date  . Colonoscopy 2004  . Back  surgery 2000  . Abdominal hysterectomy 1978    History   Social History  . Marital Status: Widowed    Spouse Name: N/A    Number of Children: N/A  . Years of Education: N/A   Occupational History  . 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 Smoking status: Former Smoker    Quit date: 09/17/1993  . Smokeless tobacco: Never Used  . Alcohol Use: No  . Drug Use: No  . Sexually Active: Not on file   Other Topics Concern  . Not on file   Social History Narrative  . No narrative on file    Family History  Problem Relation Age of Onset  . Heart disease Mother     CHF and cardiomyopathy  . Thyroid disease Mother   . Hyperlipidemia Mother   . Hyperlipidemia Father   . Cancer Father     liver  . Lupus Sister   . Hyperlipidemia Sister    Current Outpatient Prescriptions on File Prior to Visit  Medication Sig Dispense Refill  . albuterol (PROAIR HFA) 108 (90 BASE) MCG/ACT inhaler Inhale 2 puffs into the lungs every 4 (four) hours as needed for wheezing.  1 Inhaler  2  . ALPRAZolam (XANAX) 0.25 MG tablet Take 1 tablet (0.25 mg total) by mouth 3 (three) times daily as needed.  30 tablet  0  .  budesonide-formoterol (SYMBICORT) 160-4.5 MCG/ACT inhaler Inhale 2 puffs into the lungs daily.        . celecoxib (CELEBREX) 200 MG capsule Take 1 capsule (200 mg total) by mouth 2 (two) times daily. Take 200 mg by mouth 2 (two) times daily.  60 capsule  3  . FLUoxetine (PROZAC) 40 MG capsule Take 1 capsule (40 mg total) by mouth daily.  90 capsule  0  . levothyroxine (SYNTHROID, LEVOTHROID) 100 MCG tablet TAKE 1 TABLET BY MOUTH EVERY DAY  30 tablet  3  . lisinopril-hydrochlorothiazide (PRINZIDE,ZESTORETIC) 10-12.5 MG per tablet TAKE ONE TABLET BY MOUTH ONCE A DAY  30 tablet  2  . metFORMIN (GLUCOPHAGE) 500 MG tablet TAKE 1 TABLET BY MOUTH EVERY DAY  30 tablet  3  . metoprolol (TOPROL-XL) 100 MG 24 hr tablet Take 100 mg by mouth daily.        .  Multiple Vitamins-Minerals (SENIOR MULTIVITAMIN PLUS) TABS Take 1 tablet by mouth.        . rosuvastatin (CRESTOR) 20 MG tablet Take 10 mg by mouth daily.         Allergies  Allergen Reactions  . Aspirin Other (See Comments)    GI  bleed  . Statins Other (See Comments)    LEG & ARM CRAMPS  . Codeine Rash   ROS:  Denies fevers, just slight sinus headaches, occasional, no other headaches.  Denies chest pain, palpitations.  Breathing stable.  Denies nausea, vomiting.  Occasional diarrhea.  See HPI  PHYSICAL EXAM: BP 129/82  Pulse 80  Ht 5' 4.5" (1.638 m)  Wt 148 lb (67.132 kg)  BMI 25.01 kg/m2 Well developed, pleasant female in no distress HEENT:  PERRL, EOMI, conjunctiva clear.  R TM obscured by cerumen--after cerumen removal EAC and TM normal..  L EAC and TM normal. Nasal mucosa moderately edematous with some clear and white mucus.  Sinuses nontender.  OP clear Neck: no lymphadenopathy, thyromegaly or mass Heart: regular rate and rhythm without murmur Lungs: clear bilaterally Abdomen: soft, nontender, no mass Extremities: no edema Diabetic foot exam--see separate sheet Skin: no rash Psych: normal mood, affect, hygiene and grooming  ASSESSMENT/PLAN: 1. Type II or unspecified type diabetes mellitus without mention of complication, not stated as uncontrolled  POCT HgB A1C  2. COPD (chronic obstructive pulmonary disease)  Ambulatory referral to Pulmonology  3. Diabetic neuropathy  Ambulatory referral to Podiatry, gabapentin (NEURONTIN) 300 MG capsule  4. Allergic rhinitis, cause unspecified  fluticasone (FLONASE) 50 MCG/ACT nasal spray  5. Cerumen impaction  Ear cerumen removal   right  6. Mixed hyperlipidemia  Lipid panel, Hepatic function panel   DM--well controlled HTN--well controlled  Neuropathy--try increasing gabapentin evening dose to 600mg  (ie. Take 1 in the morning, 1 mid-day, and 2 at bedtime). Refer to podiatry.  Hyperlipidemia, mixed.--Try increasing fish oil to  3000-4000 mg daily.  Restart Crestor at 10mg  once a day for a week.  Try taking Coenzyme Q10 along with it. If doing well in a week (ie no muscle aches), then increase to 20mg  every other day (three times/week)--taking 20 mg (full tablet) on Monday, Wed and Friday, and take 1/2 tablet (10mg ) on all other days.  If pain recurs, then try cutting back the 20 mg to just twice weekly (ie Mon and Thurs).  Plan to recheck cholesterol in 2 months  Allergies: start Flonase.  Consider also using Mucinex if having a lot of sinus pressure or cough from postnasal drip.  You may use Claritin  or Zyrtec daily, if needed.  Avoid decongestants  COPD:  Stable overall.  Due for PFT's--refer to pulm  45 minute face to face visit

## 2011-10-25 ENCOUNTER — Other Ambulatory Visit: Payer: Self-pay | Admitting: Family Medicine

## 2011-10-30 ENCOUNTER — Telehealth: Payer: Self-pay | Admitting: Family Medicine

## 2011-10-31 ENCOUNTER — Telehealth: Payer: Self-pay | Admitting: Internal Medicine

## 2011-10-31 ENCOUNTER — Other Ambulatory Visit: Payer: Self-pay | Admitting: *Deleted

## 2011-10-31 DIAGNOSIS — E119 Type 2 diabetes mellitus without complications: Secondary | ICD-10-CM

## 2011-10-31 MED ORDER — METFORMIN HCL 500 MG PO TABS: 500.0000 mg | ORAL_TABLET | Freq: Every day | ORAL | Status: DC

## 2011-11-02 NOTE — Telephone Encounter (Signed)
done

## 2011-11-05 NOTE — Telephone Encounter (Signed)
done

## 2011-11-10 ENCOUNTER — Other Ambulatory Visit: Payer: Self-pay | Admitting: Family Medicine

## 2011-12-12 ENCOUNTER — Encounter: Payer: Self-pay | Admitting: Medical

## 2011-12-12 ENCOUNTER — Ambulatory Visit (INDEPENDENT_AMBULATORY_CARE_PROVIDER_SITE_OTHER): Payer: Medicare Other | Admitting: Medical

## 2011-12-12 VITALS — BP 120/70 | HR 76 | Temp 98.2°F | Resp 16 | Wt 149.0 lb

## 2011-12-12 DIAGNOSIS — J449 Chronic obstructive pulmonary disease, unspecified: Secondary | ICD-10-CM

## 2011-12-12 DIAGNOSIS — I10 Essential (primary) hypertension: Secondary | ICD-10-CM

## 2011-12-12 DIAGNOSIS — E119 Type 2 diabetes mellitus without complications: Secondary | ICD-10-CM

## 2011-12-12 DIAGNOSIS — Z888 Allergy status to other drugs, medicaments and biological substances status: Secondary | ICD-10-CM

## 2011-12-12 DIAGNOSIS — G589 Mononeuropathy, unspecified: Secondary | ICD-10-CM

## 2011-12-12 DIAGNOSIS — G629 Polyneuropathy, unspecified: Secondary | ICD-10-CM

## 2011-12-12 DIAGNOSIS — Z789 Other specified health status: Secondary | ICD-10-CM

## 2011-12-12 DIAGNOSIS — E782 Mixed hyperlipidemia: Secondary | ICD-10-CM

## 2011-12-12 LAB — COMPREHENSIVE METABOLIC PANEL
ALT: 25 U/L (ref 0–35)
AST: 23 U/L (ref 0–37)
BUN: 20 mg/dL (ref 6–23)
Creat: 0.66 mg/dL (ref 0.50–1.10)
Total Bilirubin: 0.5 mg/dL (ref 0.3–1.2)

## 2011-12-12 LAB — LIPID PANEL
Cholesterol: 293 mg/dL — ABNORMAL HIGH (ref 0–200)
HDL: 43 mg/dL (ref >39)
Total CHOL/HDL Ratio: 6.8 Ratio
VLDL: 60 mg/dL — ABNORMAL HIGH (ref 0–40)

## 2011-12-12 NOTE — Progress Notes (Signed)
Subjective:   HPI  [name] Day is a 66 y.o. female who presents for general recheck.  Doing well, no c/o.   Diabetes - checks glucose 1-2 times daily, fasting morning glucose always under 105.  Eats healthy, exercises daily walking daily with her dog.   Compliant with metformin once daily.  HTN - compliant with meds, no c/o.  Lipids - hx/o statin intolerance.  Currently using Crestor 10mg  daily with mild to moderate myalgias frequently despite also using Coenzyme Q10.   She wants to make sure she will get refills on her Neurontin which is working fine.    COPD - Symbicort once daily, no exacerbations that she can recall in recent past, nonsmoker, quit 1995.  No prior pulmonology consult.   No other c/o.  The following portions of the patient's history were reviewed and updated as appropriate: allergies, current medications, past family history, past medical history, past social history, past surgical history and problem list.  Past Medical History  Diagnosis Date  . Diabetes mellitus   . Hypertension   . Allergy   . COPD (chronic obstructive pulmonary disease)   . Hypothyroid 2002  . GERD (gastroesophageal reflux disease)   . Depression   . Hyperlipidemia   . Neuropathy     diabetic  . Lichen sclerosus et atrophicus of the vulva 01/2011    Allergies  Allergen Reactions  . Aspirin Other (See Comments)    GI  bleed  . Statins Other (See Comments)    LEG & ARM CRAMPS  . Codeine Rash     Review of Systems ROS reviewed and was negative other than noted in HPI or above.    Objective:   Physical Exam  General appearance: alert, no distress, WD/WN Oral cavity: MMM, no lesions Neck: supple, no lymphadenopathy, no thyromegaly, no masses Heart: RRR, normal S1, S2, no murmurs Lungs: CTA bilaterally, no wheezes, rhonchi, or rales Abdomen: +bs, soft, non tender, non distended, no masses, no hepatomegaly, no splenomegaly Pulses: 2+ symmetric Ext: no  edema   Assessment and Plan :     Encounter Diagnoses  Name Primary?  . Type II or unspecified type diabetes mellitus without mention of complication, not stated as uncontrolled Yes  . Essential hypertension, benign   . Mixed hyperlipidemia   . COPD (chronic obstructive pulmonary disease)   . Statin intolerance   . Neuropathy    Diabetes - has been well controlled.  HgbA1C today.  C/t daily glucometer checks, healthy diet, daily walking for exercise.    HTN - controlled on current medication  Mixed hyperlipidemia - labs today.  She has been on several other medications prior.   Seemingly all lipoid therapies have given her problems, especially statins.  She has prior used Crestor, Lipitor, Vytorin, Niaspan, Fenofibrate.  She has done the best with Crestor, however, even the current 10mg  daily causes her myalgias. We will check labs today and modify therapy accordingly.   COPD - she declines pulmonology referral as she has been fine without c/o, on Symbicort 3 years once daily.  No exacerbation in recent past.  We will have her go for PFTs.  Neuropathy - doing well on Neurontin 300mg  breakfast and lunch, 2 capsules of 300mg  each at bedtime.   I reviewed her prior visits and labs here with Dr. Lynelle Doctor.

## 2011-12-13 LAB — HEMOGLOBIN A1C
Hgb A1c MFr Bld: 5.9 % — ABNORMAL HIGH (ref <5.7)
Mean Plasma Glucose: 123 mg/dL — ABNORMAL HIGH (ref <117)

## 2011-12-17 ENCOUNTER — Encounter (HOSPITAL_COMMUNITY): Payer: Medicare Other

## 2011-12-18 ENCOUNTER — Other Ambulatory Visit: Payer: Self-pay | Admitting: Medical

## 2011-12-18 MED ORDER — ROSUVASTATIN CALCIUM 20 MG PO TABS: 20.0000 mg | ORAL_TABLET | Freq: Every day | ORAL | Status: DC

## 2011-12-24 ENCOUNTER — Ambulatory Visit (HOSPITAL_COMMUNITY)
Admission: RE | Admit: 2011-12-24 | Discharge: 2011-12-24 | Disposition: A | Discharge status: Home / Self Care | Payer: Medicare Other | Source: Ambulatory Visit | Attending: Medical | Admitting: Medical

## 2011-12-24 DIAGNOSIS — J4489 Other specified chronic obstructive pulmonary disease: Secondary | ICD-10-CM | POA: Insufficient documentation

## 2011-12-24 DIAGNOSIS — R0609 Other forms of dyspnea: Secondary | ICD-10-CM | POA: Insufficient documentation

## 2011-12-24 DIAGNOSIS — Z79899 Other long term (current) drug therapy: Secondary | ICD-10-CM | POA: Insufficient documentation

## 2011-12-24 DIAGNOSIS — R0989 Other specified symptoms and signs involving the circulatory and respiratory systems: Secondary | ICD-10-CM | POA: Insufficient documentation

## 2011-12-24 DIAGNOSIS — J449 Chronic obstructive pulmonary disease, unspecified: Secondary | ICD-10-CM | POA: Insufficient documentation

## 2011-12-24 LAB — PULMONARY FUNCTION TEST

## 2011-12-24 MED ORDER — ALBUTEROL SULFATE (5 MG/ML) 0.5% IN NEBU
2.5000 mg | INHALATION_SOLUTION | Freq: Once | RESPIRATORY_TRACT | Status: AC
  Administered 2011-12-24: 2.5 mg via RESPIRATORY_TRACT

## 2011-12-26 ENCOUNTER — Other Ambulatory Visit: Payer: Self-pay | Admitting: Family Medicine

## 2011-12-26 MED ORDER — GABAPENTIN 300 MG PO CAPS: ORAL_CAPSULE | ORAL | Status: DC

## 2011-12-26 NOTE — Telephone Encounter (Signed)
Is this okay to refill? I believe she is your patient, think she switched from Dr.Knapp to you. Thanks.

## 2011-12-27 ENCOUNTER — Other Ambulatory Visit: Payer: Self-pay | Admitting: Medical

## 2012-01-01 ENCOUNTER — Telehealth: Payer: Self-pay | Admitting: Medical

## 2012-01-01 NOTE — Telephone Encounter (Signed)
LM

## 2012-01-08 ENCOUNTER — Telehealth: Payer: Self-pay | Admitting: Internal Medicine

## 2012-01-08 ENCOUNTER — Other Ambulatory Visit: Payer: Self-pay | Admitting: Medical

## 2012-01-08 DIAGNOSIS — F411 Generalized anxiety disorder: Secondary | ICD-10-CM

## 2012-01-08 MED ORDER — FLUOXETINE HCL 40 MG PO CAPS: 40.0000 mg | ORAL_CAPSULE | Freq: Every day | ORAL | Status: DC

## 2012-01-08 NOTE — Telephone Encounter (Signed)
THE RESULTS WAS IN EPIC. I PRINTED AND PLACED ON YOUR DESK. CLS

## 2012-01-08 NOTE — Telephone Encounter (Signed)
Where are the PFT results?  We ordered last visit but I haven't seen results?

## 2012-01-08 NOTE — Telephone Encounter (Signed)
i have the PFT results.  They basically show very minimal obstructive lung disease, almost normal function.  Has she went for weeks to months prior without taking the Symbicort?  We should probably discuss other treatment options going forward.  Lets have her either return for brief visit to discuss options for therapy and results, and to recheck on how she is tolerating the cholesterol medication.

## 2012-01-09 NOTE — Telephone Encounter (Signed)
PATIENT STATES THAT DR. KNAPP TOLD HER TO USE SYMBICORT EVERYDAY AND THAT'S WHAT SHE HAS DONE. SHE SCHEDULED AN APPT. TO SEE YOU ON 01/14/12. CLS

## 2012-01-14 ENCOUNTER — Encounter: Payer: Self-pay | Admitting: Medical

## 2012-01-14 ENCOUNTER — Ambulatory Visit (INDEPENDENT_AMBULATORY_CARE_PROVIDER_SITE_OTHER): Payer: Medicare Other | Admitting: Medical

## 2012-01-14 VITALS — BP 120/82 | HR 68 | Temp 98.2°F | Resp 16 | Wt 149.0 lb

## 2012-01-14 DIAGNOSIS — J309 Allergic rhinitis, unspecified: Secondary | ICD-10-CM | POA: Insufficient documentation

## 2012-01-14 DIAGNOSIS — J449 Chronic obstructive pulmonary disease, unspecified: Secondary | ICD-10-CM

## 2012-01-14 NOTE — Progress Notes (Signed)
Subjective:   HPI  [name] Day is a 66 y.o. female who presents for f/u on PFTs.  She notes diagnosis of COPD 3 years ago, has 30 pack year smoking history, but quite smoking in 02/15/1994.  She was put on Symbicort at diagnosis after CXR findings suggestive of COPD, recurrent bronchitis, and given second hand smoke exposure as well.  She had an initial PFT 3 years ago as part of a study, but we don't have those records.  She notes that the her husband passed away in February 15, 2010, and she no longer has second hand tobacco exposure.  She really has not had any wheezing or SOB in quite some time.  The only time she gets any problems is sometimes in the spring with allergies.  She is taking flonase and allegra daily for allergy prevention.   No other aggravating or relieving factors.    No other c/o.  The following portions of the patient's history were reviewed and updated as appropriate: allergies, current medications, past family history, past medical history, past social history, past surgical history and problem list.  Past Medical History  Diagnosis Date  . Diabetes mellitus   . Hypertension   . Allergy   . COPD (chronic obstructive pulmonary disease)   . Hypothyroid February 15, 2001  . GERD (gastroesophageal reflux disease)   . Depression   . Hyperlipidemia   . Neuropathy     diabetic  . Lichen sclerosus et atrophicus of the vulva 16-Feb-2011    Allergies  Allergen Reactions  . Aspirin Other (See Comments)    GI  bleed  . Statins Other (See Comments)    LEG & ARM CRAMPS  . Codeine Rash     Review of Systems ROS reviewed and was negative other than noted in HPI or above.    Objective:   Physical Exam  General appearance: alert, no distress, WD/WN HEENT: normocephalic, sclerae anicteric, TMs pearly, nares patent, no discharge or erythema, pharynx normal Oral cavity: MMM, no lesions Neck: supple, no lymphadenopathy, no thyromegaly, no masses Heart: RRR, normal S1, S2, no murmurs Lungs: CTA  bilaterally, no wheezes, rhonchi, or rales    Assessment and Plan :     Encounter Diagnoses  Name Primary?  Marland Kitchen COPD (chronic obstructive pulmonary disease) Yes  . Allergic rhinitis    COPD - last CXR 11/2008 with COPD and right upper lobe scarring.  Recent PFTs 12/24/11 with FEV1/FVC of 95% of predicted, FEV1 96% of predicted, and post bronchodilator was FEV1/FVC 0% changes, FEV1 6% change+.  Impression was very minimal obstructive changes.  We discused the fact that she quit smoking 1994-02-15, is exercising without c/o, is basically doing fine.  I recommended that since she is asymptomatic, to wean off Symbicort gradually over the next few weeks.  I gave her instructions on a weaning schedule.  We will c/t her allergy regimen, she can use Proair/Albuterol prn, and we will see how she does off Symbicort.  She will call report 47mo.   Allergic rhinitis - c/t FLonase and Allegra daily.  Call report 47mo.

## 2012-01-14 NOTE — Patient Instructions (Signed)
Lets start weaning off Symbicort by using the following plan:  Use 1 puff daily for the rest of this week  Then use 1 puff every other day for 2 weeks  Then use 1 puff on Monday and Thursday for 2 weeks  Then stop

## 2012-01-17 ENCOUNTER — Other Ambulatory Visit: Payer: Self-pay | Admitting: Family Medicine

## 2012-02-06 ENCOUNTER — Encounter: Payer: Self-pay | Admitting: Internal Medicine

## 2012-02-06 ENCOUNTER — Ambulatory Visit (INDEPENDENT_AMBULATORY_CARE_PROVIDER_SITE_OTHER): Payer: Medicare Other | Admitting: Internal Medicine

## 2012-02-06 VITALS — BP 118/70 | HR 85 | Temp 98.1°F | Resp 16 | Ht 65.0 in | Wt 148.5 lb

## 2012-02-06 DIAGNOSIS — E1149 Type 2 diabetes mellitus with other diabetic neurological complication: Secondary | ICD-10-CM

## 2012-02-06 DIAGNOSIS — E119 Type 2 diabetes mellitus without complications: Secondary | ICD-10-CM

## 2012-02-06 DIAGNOSIS — E1142 Type 2 diabetes mellitus with diabetic polyneuropathy: Secondary | ICD-10-CM

## 2012-02-06 DIAGNOSIS — E1140 Type 2 diabetes mellitus with diabetic neuropathy, unspecified: Secondary | ICD-10-CM

## 2012-02-06 DIAGNOSIS — J449 Chronic obstructive pulmonary disease, unspecified: Secondary | ICD-10-CM

## 2012-02-06 DIAGNOSIS — I10 Essential (primary) hypertension: Secondary | ICD-10-CM

## 2012-02-06 DIAGNOSIS — E782 Mixed hyperlipidemia: Secondary | ICD-10-CM

## 2012-02-06 DIAGNOSIS — F411 Generalized anxiety disorder: Secondary | ICD-10-CM

## 2012-02-06 NOTE — Patient Instructions (Addendum)
You can increase the allegra to 180 mg daily .  Also I recommend flush your sinuses after being outdoors with Simply Saline .Marland Kitchen  Consider wearing a light mask when cutting the grass .Marland Kitchen  You may increase your bedtime dose of gabapentin to 900 mg if needed   You do not need to stay on the metformin.  Let's give it a 3 month trial without it..   Stop the metformin.  Your fastings should stay below 120 to keep in good control.  Consider the Low Glycemic Index Diet and 6 smaller meals daily .  This boosts your metabolism and regulates your sugars:   7 AM Low carbohydrate Protein  Shakes (EAS Carb Control  Or Atkins ,  Available everywhere,   In  cases at [employer] )  2.5 carbs  (Add or substitute a toasted Arnold's sandwhich thin w/ peanut butter)  10 AM: Protein bar by Atkins (snack size,  Chocolate lover's variety at  [employer])    Lunch: sandwich on pita bread or flatbread (Joseph's makes a pita bread and a flat bread , available at [employer] and [employer]; Toufayah makes a low carb flatbread available at [employer] and [employer]) Mission makes a low carb whole wheat tortilla available at [employer] most grocery stores   3 PM:  Mid day :  Another protein bar,  Or a  cheese stick, 1/4 cup of almonds, walnuts, pistachios, pecans, peanuts,  Macadamia nuts  6 PM  Dinner:  "mean and green:"  Meat/chicken/fish, salad, and green veggie : use ranch, vinagrette,  Blue cheese, etc  9 PM snack : Breyer's low carb fudgsicle or  ice cream bar (Carb Smart), or  Weight Watcher's ice cream bar , or another protein shake  Look at [place] yogurts for low carb yogurt choices

## 2012-02-06 NOTE — Progress Notes (Signed)
Patient ID: [name] Day, female   DOB: [birth year], 66 y.o.   MRN: [id number]   Patient Active Problem List  Diagnoses  . Type II or unspecified type diabetes mellitus without mention of complication, not stated as uncontrolled  . COPD (chronic obstructive pulmonary disease)  . Mixed hyperlipidemia  . Unspecified hypothyroidism  . Osteopenia  . Anxiety state, unspecified  . Allergic rhinitis  . GERD (gastroesophageal reflux disease)  . Hypertension    Subjective:  CC:   Chief Complaint  Patient presents with  . New Patient    HPI:   [name] Day is a 66 y.o. female who presents as a new patient to establish a primary care relationship.  She has a history of COPD secondaey to history of tobacco abuse, managed with ICS/LABA, hypothyroidism, hyperlipidemia and Type DM.  She has no symptoms of dyspnea or cough currently and has had recent PFTs that reportedly reflect nearly complete return to pre COPD function.   done at [facility].  She has a history of osteopenia by DEXA scan.  NO history of fractures but has a history of lumbar spine surgery which was complicated by a CSF leak.  History of hypertension and history of  hemorrhage behind right eye which presented with temporary loss of vision in the right eye , managed by [facility] .  She denies pain , bowel and bladder incontinence and is IADLs.   Past Medical History  Diagnosis Date  . Lichen sclerosus et atrophicus of the vulva 01/2011  . allergic rhinitis   . COPD (chronic obstructive pulmonary disease)     PFTS 2013 FEV1 nearly normalized  . Depression   . Diabetes mellitus   . GERD (gastroesophageal reflux disease)   . Hyperlipidemia   . Hypertension   . Neuropathy     diabetic  . Hypothyroid 2002    Past Surgical History  Procedure Date  . Colonoscopy 2004  . Back surgery 2000  . Abdominal hysterectomy 1978  . Spine surgery     L3 to L5         The following portions of the patient's history  were reviewed and updated as appropriate: Allergies, current medications, and problem list.    Review of Systems:   12 Pt  review of systems was negative except those addressed in the HPI,     History   Social History  . Marital Status: Widowed    Spouse Name: N/A    Number of Children: N/A  . Years of Education: N/A   Occupational History  . 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 Smoking status: Former Smoker -- 25 years    Types: Cigarettes    Quit date: 09/17/1993  . Smokeless tobacco: Never Used  . Alcohol Use: No  . Drug Use: No  . Sexually Active: Not on file   Other Topics Concern  . Not on file   Social History Narrative  . No narrative on file    Objective:  BP 118/70  Pulse 85  Temp(Src) 98.1 F (36.7 C) (Oral)  Resp 16  Ht 5\' 5"  (1.651 m)  Wt 148 lb 8 oz (67.359 kg)  BMI 24.71 kg/m2  SpO2 96%  General appearance: alert, cooperative and appears stated age Ears: normal TM's and external ear canals both ears Throat: lips, mucosa, and tongue normal; teeth and gums normal Neck: no adenopathy, no carotid bruit, supple, symmetrical, trachea midline and thyroid  not enlarged, symmetric, no tenderness/mass/nodules Back: symmetric, no curvature. ROM normal. No CVA tenderness. Lungs: clear to auscultation bilaterally Heart: regular rate and rhythm, S1, S2 normal, no murmur, click, rub or gallop Abdomen: soft, non-tender; bowel sounds normal; no masses,  no organomegaly Pulses: 2+ and symmetric Skin: Skin color, texture, turgor normal. No rashes or lesions Lymph nodes: Cervical, supraclavicular, and axillary nodes normal.  Assessment and Plan:  Mixed hyperlipidemia Her lipids have been uncontrolled due to prior niacin intolerance and onset of myalgias with higher doses of crestor. Trigs were 300, LDL 190 in march on Crestor 10 mg.  Her Crestor was incresaed to 20 mg at that tme and she is due for repeat lipids   Discussed low glycemic  index diet with her . Goal is 70 due to concurrent DM.   Hypertension Well controlled on current medications.  No changes today.   Anxiety state, unspecified Currently managed with prozac, with acceptable daily level of anxiety.   No changes today.  COPD (chronic obstructive pulmonary disease) secondary to former tobacco abuse.  PFTS last month were excellent.,  . FEV1 was 96% of predicted.  TLC 102%.  Will discussed stopped Symbicort at next visit.   Type II or unspecified type diabetes mellitus without mention of complication, not stated as uncontrolled managed with metformin only.  Last A1c was 5.9 in March. She is on an ACE I, has hyperlipidemia uncontrolled despite statin use, and has  GI intolerance to aspirin. She is having daily diarrhea with metformin and is not overweight. Discussed 3 month suspension of metformin and adherence to low glycemic index diet .  Diabetes mellitus with neuropathy Her diabetes is well controlled but her neuropathy is not.  Discussed increasing her dose of gabapentin at bedtime (1 hour before , actually)    Updated Medication List Outpatient Encounter Prescriptions as of 02/06/2012  Medication Sig Dispense Refill  . albuterol (PROAIR HFA) 108 (90 BASE) MCG/ACT inhaler Inhale 2 puffs into the lungs every 4 (four) hours as needed for wheezing.  1 Inhaler  2  . ALPRAZolam (XANAX) 0.25 MG tablet Take 1 tablet (0.25 mg total) by mouth 3 (three) times daily as needed.  30 tablet  0  . budesonide-formoterol (SYMBICORT) 160-4.5 MCG/ACT inhaler Inhale 2 puffs into the lungs daily.        . celecoxib (CELEBREX) 200 MG capsule Take 1 capsule (200 mg total) by mouth 2 (two) times daily. Take 200 mg by mouth 2 (two) times daily.  60 capsule  3  . fexofenadine (ALLEGRA) 180 MG tablet Take 180 mg by mouth daily.      Marland Kitchen FLUoxetine (PROZAC) 40 MG capsule Take 1 capsule (40 mg total) by mouth daily.  90 capsule  0  . fluticasone (FLONASE) 50  MCG/ACT nasal spray Place 2 sprays into the nose daily.  16 g  6  . gabapentin (NEURONTIN) 300 MG capsule 1 tablet in morning, and mid-day.  2 tablets at bedtime  120 capsule  0  . levothyroxine (SYNTHROID, LEVOTHROID) 100 MCG tablet TAKE 1 TABLET BY MOUTH EVERY DAY  30 tablet  3  . lisinopril-hydrochlorothiazide (PRINZIDE,ZESTORETIC) 10-12.5 MG per tablet TAKE ONE TABLET BY MOUTH ONCE A DAY  30 tablet  2  . metFORMIN (GLUCOPHAGE) 500 MG tablet TAKE 1 TABLET (500 MG TOTAL) BY MOUTH DAILY.  30 tablet  5  . metoprolol (TOPROL-XL) 100 MG 24 hr tablet Take 100 mg by mouth daily.        . Multiple  Vitamins-Minerals (SENIOR MULTIVITAMIN PLUS) TABS Take 1 tablet by mouth.        . Omega-3 Fatty Acids (FISH OIL) 1000 MG CAPS Take 1 capsule by mouth daily.      . rosuvastatin (CRESTOR) 20 MG tablet Take 1 tablet (20 mg total) by mouth daily.  30 tablet  5  . DISCONTD: amoxicillin-clavulanate (AUGMENTIN) 875-125 MG per tablet Take 1 tablet by mouth 2 (two) times daily.      Marland Kitchen DISCONTD: gabapentin (NEURONTIN) 300 MG capsule TAKE ONE CAPSULE BY MOUTH 3 TIMES A DAY  90 capsule  0  . DISCONTD: gabapentin (NEURONTIN) 300 MG capsule 1 capsule breakfast and lunch, 2 capsules QHS  120 capsule  2   60 minutes was spent with this new patient addressing chronic medical problems and giving counselling on diet and nutrition.

## 2012-02-10 ENCOUNTER — Encounter: Payer: Self-pay | Admitting: Internal Medicine

## 2012-02-10 DIAGNOSIS — F329 Major depressive disorder, single episode, unspecified: Secondary | ICD-10-CM | POA: Insufficient documentation

## 2012-02-10 DIAGNOSIS — E039 Hypothyroidism, unspecified: Secondary | ICD-10-CM | POA: Insufficient documentation

## 2012-02-10 DIAGNOSIS — E1140 Type 2 diabetes mellitus with diabetic neuropathy, unspecified: Secondary | ICD-10-CM | POA: Insufficient documentation

## 2012-02-10 DIAGNOSIS — E785 Hyperlipidemia, unspecified: Secondary | ICD-10-CM | POA: Insufficient documentation

## 2012-02-10 DIAGNOSIS — I10 Essential (primary) hypertension: Secondary | ICD-10-CM | POA: Insufficient documentation

## 2012-02-10 DIAGNOSIS — F32A Depression, unspecified: Secondary | ICD-10-CM | POA: Insufficient documentation

## 2012-02-10 DIAGNOSIS — K219 Gastro-esophageal reflux disease without esophagitis: Secondary | ICD-10-CM | POA: Insufficient documentation

## 2012-02-10 NOTE — Assessment & Plan Note (Signed)
secondary to former tobacco abuse.  PFTS last month were excellent.,  . FEV1 was 96% of predicted.  TLC 102%.  Will discussed stopped Symbicort at next visit.

## 2012-02-10 NOTE — Assessment & Plan Note (Signed)
Currently managed with prozac, with acceptable daily level of anxiety.   No changes today.

## 2012-02-10 NOTE — Assessment & Plan Note (Signed)
Well controlled on current medications.  No changes today. 

## 2012-02-10 NOTE — Assessment & Plan Note (Addendum)
managed with metformin only.  Last A1c was 5.9 in March. She is on an ACE I, has hyperlipidemia uncontrolled despite statin use, and has  GI intolerance to aspirin. She is having daily diarrhea with metformin and is not overweight. Discussed 3 month suspension of metformin and adherence to low glycemic index diet .

## 2012-02-10 NOTE — Assessment & Plan Note (Addendum)
Her lipids have been uncontrolled due to prior niacin intolerance and onset of myalgias with higher doses of crestor. Trigs were 300, LDL 190 in march on Crestor 10 mg.  Her Crestor was incresaed to 20 mg at that tme and she is due for repeat lipids  Discussed low glycemic  index diet with her . Goal is 70 due to concurrent DM.

## 2012-02-10 NOTE — Assessment & Plan Note (Signed)
Her diabetes is well controlled but her neuropathy is not.  Discussed increasing her dose of gabapentin at bedtime (1 hour before , actually)

## 2012-03-20 ENCOUNTER — Other Ambulatory Visit: Payer: Self-pay | Admitting: Medical

## 2012-04-21 ENCOUNTER — Other Ambulatory Visit: Payer: Self-pay | Admitting: Family Medicine

## 2012-04-21 ENCOUNTER — Other Ambulatory Visit: Payer: Self-pay | Admitting: Internal Medicine

## 2012-05-02 ENCOUNTER — Other Ambulatory Visit: Payer: Self-pay | Admitting: Internal Medicine

## 2012-05-12 ENCOUNTER — Encounter: Payer: Self-pay | Admitting: Internal Medicine

## 2012-05-12 ENCOUNTER — Ambulatory Visit (INDEPENDENT_AMBULATORY_CARE_PROVIDER_SITE_OTHER): Payer: Medicare Other | Admitting: Internal Medicine

## 2012-05-12 VITALS — BP 122/64 | HR 84 | Temp 98.9°F | Resp 16 | Wt 147.5 lb

## 2012-05-12 DIAGNOSIS — E1149 Type 2 diabetes mellitus with other diabetic neurological complication: Secondary | ICD-10-CM

## 2012-05-12 DIAGNOSIS — E785 Hyperlipidemia, unspecified: Secondary | ICD-10-CM

## 2012-05-12 DIAGNOSIS — M064 Inflammatory polyarthropathy: Secondary | ICD-10-CM

## 2012-05-12 DIAGNOSIS — E1140 Type 2 diabetes mellitus with diabetic neuropathy, unspecified: Secondary | ICD-10-CM

## 2012-05-12 DIAGNOSIS — M130 Polyarthritis, unspecified: Secondary | ICD-10-CM

## 2012-05-12 DIAGNOSIS — E119 Type 2 diabetes mellitus without complications: Secondary | ICD-10-CM

## 2012-05-12 DIAGNOSIS — Z1239 Encounter for other screening for malignant neoplasm of breast: Secondary | ICD-10-CM

## 2012-05-12 DIAGNOSIS — I10 Essential (primary) hypertension: Secondary | ICD-10-CM

## 2012-05-12 DIAGNOSIS — E1142 Type 2 diabetes mellitus with diabetic polyneuropathy: Secondary | ICD-10-CM

## 2012-05-12 DIAGNOSIS — J449 Chronic obstructive pulmonary disease, unspecified: Secondary | ICD-10-CM

## 2012-05-12 DIAGNOSIS — E782 Mixed hyperlipidemia: Secondary | ICD-10-CM

## 2012-05-12 NOTE — Assessment & Plan Note (Signed)
Episodic, with history of synovitis, fevers and bony overgrowth involving hands and feet.  Recommending repeat rheumatologic evaluation .  Serologies pending.

## 2012-05-12 NOTE — Assessment & Plan Note (Signed)
Asymptomatic currently 

## 2012-05-12 NOTE — Assessment & Plan Note (Signed)
Managed with crestor for LDL, with repeat triglucerides due on low GI diet.  Prior trial of trilipix was tolerated and will be resumed for trigs > 300.

## 2012-05-12 NOTE — Assessment & Plan Note (Signed)
Well-controlled on current regimen. ?

## 2012-05-12 NOTE — Patient Instructions (Addendum)
Consider a Low Glycemic Index Diet and eating 6 smaller meals daily .  This frequent feeding stimulates your metabolism and the lower glycemic index foods will lower your blood sugars:   This is an example of my daily  "Low GI"  Diet:  All of the foods can be found at grocery stores and in bulk at [employer]  club   7 AM Breakfast:  Low carbohydrate Protein  Shakes (I recommend the EAS AdvantEdge "Carb Control" shakes  Or the low carb shakes by Atkins.   Both are available everywhere:  In  cases at BJs  Or in 4 packs at grocery stores and pharmacies  2.5 carbs  (Alternative is  a toasted Arnold's Sandwhich Thin w/ peanut butter, a "Bagel Thin" with cream cheese and salmon) or  a scrambled egg burrito made with a low carb tortilla .  Avoid cereal and bananas, oatmeal too unless the old fashioned kind that takes 30-40 minutes to prepare.  the rest is overly processed, has minimal fiber, and loaded with carbohydrates!   10 AM: Protein bar by Atkins (the snack size, under 200 cal.  There are many varieties , available widely again or in bulk in limited varieties at BJs)  Other so called "protein bars" tend to be loaded with carbohydrates.  Remember, in food advertising, the word "energy" is synonymous for " carbohydrate."  Lunch: sandwich of turkey, (or any lunchmeat or canned tuna), fresh avocado and cheese on a lower carbohydrate pita bread, flatbread, or tortilla . Ok to use mayonnaise. The bread is the only source or carbohydrate that can be decreased (Joseph's makes a pita bread and a flat bread  Are 50 cal and 4 net carbs ; Toufayan makes a low carb flatbread 100 cal and 9 net carbs  and  Mission makes a low carb whole wheat tortilla  210 cal and 6 net carbs)  3 PM:  Mid day :  Another protein bar,  Or a  cheese stick (100 cal, 0 carbs),  Or 1 ounce of  almonds, walnuts, pistachios, pecans, peanuts,  Macadamia nuts. Or a Dannon light n Fit greek yogurt, 80 cal 8 net carbs . Avoid "granola"; the dried  cranberries and raisins are loaded with carbohydrates.    6 PM  Dinner:  "mean and green:"  Meat/chicken/fish or a high protein legume; , with a green salad, and a low GI  Veggie (broccoli, cauliflower, green beans, spinach, brussel sprouts. Lima beans) : Avoid "Low fat dressings, [place] and [place]! They are loaded with sugar! Instead use ranch, vinagrette,  Blue cheese, etc  9 PM snack : Breyer's "low carb" fudgsicle or  ice cream bar (Carb Smart line), or  Weight Watcher's ice cream bar , or anouther "no sugar added" ice cream; or another protein shake or a serving of fresh fruit with whipped cream (Avoid bananas, pineapple, grapes  and watermelon on a regular basis because they are high in sugar)   Remember that snack Substitutions should be less than 15 to 20 carbs  Per serving. Remember to subtract fiber grams to get the "net carbs." 

## 2012-05-12 NOTE — Assessment & Plan Note (Signed)
Controlled with metformin alone,  With recent suspension since a1c was 5.9,  Repeat a1c is due. She is up to date on eye exam.

## 2012-05-12 NOTE — Progress Notes (Signed)
Patient ID: [name] Day, female   DOB: [birth year], 66 y.o.   MRN: [id number]  Patient Active Problem List  Diagnosis  . Mixed hyperlipidemia  . Unspecified hypothyroidism  . Osteopenia  . Anxiety state, unspecified  . Allergic rhinitis  . GERD (gastroesophageal reflux disease)  . Hypertension  . Diabetes mellitus with neuropathy  . Inflammatory polyarthritis    Subjective:  CC:   Chief Complaint  Patient presents with  . Follow-up    3 month    HPI:   [name] Yera Kirchbaumis a 66 y.o. female who presents for follow up on hyperlipidemia and diabetes.  At last visit metformin was suspended due to daily diarrhea and low GI diet was recommended.  Repeat labs are due. Last a1c was 5.9 in March .  She had had recent epiosde of polyarhtritis accompanied vby subjective fevers. Occurss once a year around late summer. Early fall.   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  She had rheumatic fever as a child. And had her first reaction after receiving the HEPTA vax vaccine  the first generation,  In the mid 1970's when she was employed by [employer].  Several employees had the same reaction and the CDC assured her company at the time that the vaccine was a killed virus. And not contaminated.   Last episodes was 5 weeks ago,  Not sure if she hever had an evaluation whiel symptomatic.   Overdue for eye exam at [facility].  Has been overdue for mammogram.    Past Medical History  Diagnosis Date  . Lichen sclerosus et atrophicus of the vulva 01/2011  . allergic rhinitis   . COPD (chronic obstructive pulmonary disease)     PFTS 2013 FEV1 nearly normalized  . Depression   . Diabetes mellitus   . GERD (gastroesophageal reflux disease)   . Hyperlipidemia   . Hypertension   . Neuropathy     diabetic  . Hypothyroid 2002    Past  Surgical History  Procedure Date  . Colonoscopy 2004  . Back surgery 2000  . Abdominal hysterectomy 1978  . Spine surgery     L3 to L5    The following portions of the patient's history were reviewed and updated as appropriate: Allergies, current medications, and problem list.    Review of Systems:   12 Pt  review of systems was negative except those addressed in the HPI,     History   Social History  . Marital Status: Widowed    Spouse Name: N/A    Number of Children: N/A  . Years of Education: N/A   Occupational History  . 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 Smoking status: Former Smoker -- 25 years    Types: Cigarettes    Quit date: 09/17/1993  . Smokeless tobacco: Never Used  . Alcohol Use: No  . Drug Use: No  . Sexually Active: Not on file   Other Topics Concern  . Not on file   Social History Narrative  . No narrative on file    Objective:  BP 122/64  Pulse 84  Temp 98.9 F (37.2 C) (Oral)  Resp 16  Wt 147 lb 8 oz (66.906 kg)  SpO2 96%  General appearance: alert, cooperative and appears stated age Ears: normal TM's and external ear canals  both ears Throat: lips, mucosa, and tongue normal; teeth and gums normal Neck: no adenopathy, no carotid bruit, supple, symmetrical, trachea midline and thyroid not enlarged, symmetric, no tenderness/mass/nodules Back: symmetric, no curvature. ROM normal. No CVA tenderness. Lungs: clear to auscultation bilaterally Heart: regular rate and rhythm, S1, S2 normal, no murmur, click, rub or gallop Abdomen: soft, non-tender; bowel sounds normal; no masses,  no organomegaly Pulses: 2+ and symmetric Skin: Skin color, texture, turgor normal. No rashes or lesions Lymph nodes: Cervical, supraclavicular, and axillary nodes normal. MSK: bony overgrowth of thumbs and dorsal arches of feet  Assessment and Plan:  COPD (chronic obstructive pulmonary disease) Asymptomatic  currently.   Mixed hyperlipidemia Managed with crestor for LDL, with repeat triglucerides due on low GI diet.  Prior trial of trilipix was tolerated and will be resumed for trigs > 300.  Hypertension Well controlled on current regimen.   Diabetes mellitus with neuropathy Controlled with metformin alone,  With recent suspension since a1c was 5.9,  Repeat a1c is due. She is up to date on eye exam.  Inflammatory polyarthritis Episodic, with history of synovitis, fevers and bony overgrowth involving hands and feet.  Recommending repeat rheumatologic evaluation .  Serologies pending.    Updated Medication List Outpatient Encounter Prescriptions as of 05/12/2012  Medication Sig Dispense Refill  . albuterol (PROAIR HFA) 108 (90 BASE) MCG/ACT inhaler Inhale 2 puffs into the lungs every 4 (four) hours as needed for wheezing.  1 Inhaler  2  . ALPRAZolam (XANAX) 0.25 MG tablet Take 1 tablet (0.25 mg total) by mouth 3 (three) times daily as needed.  30 tablet  0  . CELEBREX 200 MG capsule TAKE ONE CAPSULE BY MOUTH TWICE A DAY  60 capsule  3  . fexofenadine (ALLEGRA) 180 MG tablet Take 180 mg by mouth daily.      Marland Kitchen FLUoxetine (PROZAC) 40 MG capsule Take 1 capsule (40 mg total) by mouth daily.  90 capsule  0  . fluticasone (FLONASE) 50 MCG/ACT nasal spray Place 2 sprays into the nose daily.  16 g  6  . gabapentin (NEURONTIN) 300 MG capsule TAKE 1 CAPSULE BY MOUTH AT BREAKFAST AND LUNCH AND 2 CAPSULES BY MOUTH AT BEDTIME  120 capsule  2  . levothyroxine (SYNTHROID, LEVOTHROID) 100 MCG tablet TAKE 1 TABLET BY MOUTH EVERY DAY  30 tablet  3  . Multiple Vitamins-Minerals (SENIOR MULTIVITAMIN PLUS) TABS Take 1 tablet by mouth.        . Omega-3 Fatty Acids (FISH OIL) 1000 MG CAPS Take 1 capsule by mouth daily.      . rosuvastatin (CRESTOR) 20 MG tablet Take 1 tablet (20 mg total) by mouth daily.  30 tablet  5  . lisinopril-hydrochlorothiazide (PRINZIDE,ZESTORETIC) 10-12.5 MG per tablet TAKE 1 TABLET BY MOUTH  EVERY DAY  30 tablet  5  . DISCONTD: budesonide-formoterol (SYMBICORT) 160-4.5 MCG/ACT inhaler Inhale 2 puffs into the lungs daily.        Marland Kitchen DISCONTD: gabapentin (NEURONTIN) 300 MG capsule 1 tablet in morning, and mid-day.  2 tablets at bedtime  120 capsule  0  . DISCONTD: metFORMIN (GLUCOPHAGE) 500 MG tablet TAKE 1 TABLET (500 MG TOTAL) BY MOUTH DAILY.  30 tablet  5  . DISCONTD: metoprolol (TOPROL-XL) 100 MG 24 hr tablet Take 100 mg by mouth daily.           Orders Placed This Encounter  Procedures  . MM Digital Screening  . Lipid panel  . Comprehensive metabolic panel  .  TSH  . Uric acid  . Rheumatoid factor  . Sedimentation rate  . C-reactive protein  . ANA    No Follow-up on file.

## 2012-05-12 NOTE — Assessment & Plan Note (Deleted)
Controlled with metformin alone,  With recent suspension since a1c was 5.9,  Repeat a1c is due. She is up to date on eye exam. 

## 2012-05-16 ENCOUNTER — Other Ambulatory Visit (INDEPENDENT_AMBULATORY_CARE_PROVIDER_SITE_OTHER): Payer: Medicare Other | Admitting: *Deleted

## 2012-05-16 DIAGNOSIS — M130 Polyarthritis, unspecified: Secondary | ICD-10-CM

## 2012-05-16 DIAGNOSIS — E119 Type 2 diabetes mellitus without complications: Secondary | ICD-10-CM

## 2012-05-16 DIAGNOSIS — E785 Hyperlipidemia, unspecified: Secondary | ICD-10-CM

## 2012-05-16 LAB — COMPREHENSIVE METABOLIC PANEL
AST: 27 U/L (ref 0–37)
Alkaline Phosphatase: 65 U/L (ref 39–117)
BUN: 19 mg/dL (ref 6–23)
Creatinine, Ser: 0.7 mg/dL (ref 0.4–1.2)

## 2012-05-16 LAB — LIPID PANEL
Cholesterol: 163 mg/dL (ref 0–200)
HDL: 39.8 mg/dL (ref >39.00)
LDL Cholesterol: 84 mg/dL (ref 0–99)
Triglycerides: 196 mg/dL — ABNORMAL HIGH (ref 0.0–149.0)
VLDL: 39.2 mg/dL (ref 0.0–40.0)

## 2012-05-16 LAB — C-REACTIVE PROTEIN: CRP: >0.5 mg/dL (ref 0.5–20.0)

## 2012-05-16 LAB — TSH: TSH: 1.07 u[IU]/mL (ref 0.35–5.50)

## 2012-05-16 LAB — URIC ACID: Uric Acid, Serum: 6.8 mg/dL (ref 2.4–7.0)

## 2012-05-17 LAB — RHEUMATOID FACTOR: Rhuematoid fact SerPl-aCnc: <10 IU/mL (ref <=14)

## 2012-05-18 LAB — ANA: Anti Nuclear Antibody(ANA): POSITIVE — AB

## 2012-05-23 NOTE — Addendum Note (Signed)
Addended by: Duncan Dull on: 05/23/2012 10:22 AM   Modules accepted: Orders

## 2012-05-28 LAB — HM DIABETES FOOT EXAM: HM Diabetic Foot Exam: NORMAL

## 2012-06-02 ENCOUNTER — Other Ambulatory Visit: Payer: Self-pay | Admitting: Internal Medicine

## 2012-06-17 ENCOUNTER — Other Ambulatory Visit: Payer: Self-pay | Admitting: Medical

## 2012-08-01 ENCOUNTER — Other Ambulatory Visit: Payer: Self-pay | Admitting: Medical

## 2012-08-01 NOTE — Telephone Encounter (Signed)
Is this ok?

## 2012-08-05 ENCOUNTER — Other Ambulatory Visit: Payer: Self-pay | Admitting: Internal Medicine

## 2012-08-27 ENCOUNTER — Telehealth: Payer: Self-pay | Admitting: Internal Medicine

## 2012-08-27 ENCOUNTER — Ambulatory Visit (INDEPENDENT_AMBULATORY_CARE_PROVIDER_SITE_OTHER): Payer: Medicare Other | Admitting: Internal Medicine

## 2012-08-27 ENCOUNTER — Encounter: Payer: Self-pay | Admitting: Internal Medicine

## 2012-08-27 VITALS — BP 124/76 | HR 71 | Temp 98.6°F | Ht 65.5 in | Wt 150.5 lb

## 2012-08-27 DIAGNOSIS — M064 Inflammatory polyarthropathy: Secondary | ICD-10-CM

## 2012-08-27 DIAGNOSIS — E1149 Type 2 diabetes mellitus with other diabetic neurological complication: Secondary | ICD-10-CM

## 2012-08-27 DIAGNOSIS — Z1239 Encounter for other screening for malignant neoplasm of breast: Secondary | ICD-10-CM

## 2012-08-27 DIAGNOSIS — I10 Essential (primary) hypertension: Secondary | ICD-10-CM

## 2012-08-27 DIAGNOSIS — E1142 Type 2 diabetes mellitus with diabetic polyneuropathy: Secondary | ICD-10-CM

## 2012-08-27 DIAGNOSIS — E1140 Type 2 diabetes mellitus with diabetic neuropathy, unspecified: Secondary | ICD-10-CM

## 2012-08-27 DIAGNOSIS — M899 Disorder of bone, unspecified: Secondary | ICD-10-CM

## 2012-08-27 DIAGNOSIS — Z0000 Encounter for general adult medical examination without abnormal findings: Secondary | ICD-10-CM

## 2012-08-27 DIAGNOSIS — M8580 Other specified disorders of bone density and structure, unspecified site: Secondary | ICD-10-CM

## 2012-08-27 DIAGNOSIS — R5383 Other fatigue: Secondary | ICD-10-CM

## 2012-08-27 DIAGNOSIS — E119 Type 2 diabetes mellitus without complications: Secondary | ICD-10-CM

## 2012-08-27 DIAGNOSIS — R5381 Other malaise: Secondary | ICD-10-CM

## 2012-08-27 DIAGNOSIS — F411 Generalized anxiety disorder: Secondary | ICD-10-CM

## 2012-08-27 DIAGNOSIS — E782 Mixed hyperlipidemia: Secondary | ICD-10-CM

## 2012-08-27 LAB — LIPID PANEL
HDL: 37 mg/dL — ABNORMAL LOW (ref >39.00)
Triglycerides: 188 mg/dL — ABNORMAL HIGH (ref 0.0–149.0)
VLDL: 37.6 mg/dL (ref 0.0–40.0)

## 2012-08-27 LAB — MICROALBUMIN / CREATININE URINE RATIO: Creatinine,U: 199.3 mg/dL

## 2012-08-27 LAB — COMPREHENSIVE METABOLIC PANEL
Albumin: 4.3 g/dL (ref 3.5–5.2)
Alkaline Phosphatase: 69 U/L (ref 39–117)
BUN: 17 mg/dL (ref 6–23)
CO2: 28 mEq/L (ref 19–32)
Calcium: 9.5 mg/dL (ref 8.4–10.5)
GFR: 89.07 mL/min (ref >60.00)
Glucose, Bld: 99 mg/dL (ref 70–99)
Potassium: 4.1 mEq/L (ref 3.5–5.1)
Total Protein: 8.2 g/dL (ref 6.0–8.3)

## 2012-08-27 LAB — LDL CHOLESTEROL, DIRECT: Direct LDL: 193.8 mg/dL

## 2012-08-27 LAB — TSH: TSH: 1.5 u[IU]/mL (ref 0.35–5.50)

## 2012-08-27 MED ORDER — ALPRAZOLAM 0.25 MG PO TABS: 0.2500 mg | ORAL_TABLET | Freq: Three times a day (TID) | ORAL | Status: DC | PRN

## 2012-08-27 NOTE — Patient Instructions (Addendum)
This is  my version of a  "Low GI"  Diet:  All of the foods can be found at grocery stores and in bulk at [employer].  The Atkins protein bars and shakes are available in more varieties at Target, [employer] and [employer].     7 AM Breakfast:  Low carbohydrate Protein  Shakes (I recommend the EAS AdvantEdge "Carb Control" shakes  Or the low carb shakes by Atkins.   Both are available everywhere:  In  cases at BJs  Or in 4 packs at grocery stores and pharmacies  2.5 carbs  (Alternative is  a toasted Arnold's Sandwhich Thin w/ peanut butter, a "Bagel Thin" with cream cheese and salmon) or  a scrambled egg burrito made with a low carb tortilla .  Avoid cereal and bananas, oatmeal too unless you are cooking the old fashioned kind that takes 30-40 minutes to prepare.  the rest is overly processed, has minimal fiber, and is loaded with carbohydrates!   10 AM: Protein bar by Atkins (the snack size, under 200 cal).  There are many varieties , available widely again or in bulk in limited varieties at BJs)  Other so called "protein bars" tend to be loaded with carbohydrates.  Remember, in food advertising, the word "energy" is synonymous for " carbohydrate."  Lunch: sandwich of turkey, (or any lunchmeat, grilled meat or canned tuna), fresh avocado, mayonnaise  and cheese on a lower carbohydrate pita bread, flatbread, or tortilla . Ok to use regular mayonnaise. The bread is the only source or carbohydrate that can be decreased (Joseph's makes a pita bread and a flat bread that are 50 cal and 4 net carbs ; Toufayan makes a low carb flatbread that's 100 cal and 9 net carbs  and  Mission makes a low carb whole wheat tortilla  That is 210 cal and 6 net carbs)  3 PM:  Mid day :  Another protein bar,  Or a  cheese stick (100 cal, 0 carbs),  Or 1 ounce of  almonds, walnuts, pistachios, pecans, peanuts,  Macadamia nuts. Or a Dannon light n Fit greek yogurt, 80 cal 8 net carbs . Avoid "granola"; the dried cranberries and  raisins are loaded with carbohydrates. Mixed nuts ok if no raisins or cranberries or dried fruit.      6 PM  Dinner:  "mean and green:"  Meat/chicken/fish or a high protein legume; , with a green salad, and a low GI  Veggie (broccoli, cauliflower, green beans, spinach, brussel sprouts. Lima beans) : Avoid "Low fat dressings, as well as [place] and [place]! They are loaded with sugar! Instead use ranch, vinagrette,  Blue cheese, etc.  There is a low carb pasta by Dreamfield's available at Lowe's grocery that is acceptable and tastes great. Try Michel Angelo's chicken piccata over low carb pasta. The chicken dish is 0 carbs, and can be found in frozen section at BJs and Lowe's. Also try Aaron Sanchez's "Carnitas" (pulled pork, no sauce,  0 carbs) and his pot roast.   both are in the refrigerated section at BJs   9 PM snack : Breyer's "low carb" fudgsicle or  ice cream bar (Carb Smart line), or  Weight Watcher's ice cream bar , or another "no sugar added" ice cream;a serving of fresh berries/cherries with whipped cream (Avoid bananas, pineapple, grapes  and watermelon on a regular basis because they are high in sugar)   Remember that snack Substitutions should be less than 15 to   20 carbs  Per serving. Remember to subtract fiber grams and sugar alcohols to get the "net carbs."   

## 2012-08-27 NOTE — Progress Notes (Signed)
Patient ID: [name] Day, female   DOB: [birth year], 66 y.o.   MRN: [id number] The patient is here for Welcome to  Medicare wellness examination and management of other chronic and acute problems.  She feel generally well but has been havign nightmares recently.  This time of the year is melancholy for her bc she lost husband and her mother during the winter months.  She found out recently that she is a descendant of the Ashkenazi Jews.   The risk factors are reflected in the social history.  The roster of all physicians providing medical care to patient - is listed in the Snapshot section of the chart.  Activities of daily living:  The patient is 100% independent in all ADLs: dressing, toileting, feeding as well as independent mobility  Home safety : The patient has smoke detectors in the home. They wear seatbelts.  There are no firearms at home. There is no violence in the home.   There is no risks for hepatitis, STDs or HIV. There is no   history of blood transfusion. They have no travel history to infectious disease endemic areas of the world.  The patient has seen their dentist in the last six month. They have seen their eye doctor in the last year. They admit to slight hearing difficulty with regard to whispered voices and some television programs.  They have deferred audiologic testing in the last year.  They do not  have excessive sun exposure. Discussed the need for sun protection: hats, long sleeves and use of sunscreen if there is significant sun exposure.   Diet: the importance of a healthy diet is discussed. They do have a healthy diet.  The benefits of regular aerobic exercise were discussed. She walks 4 times per week ,  20 minutes.   Depression screen: there are no signs or vegative symptoms of depression- irritability, change in appetite, anhedonia, sadness/tearfullness.  Cognitive assessment: the patient manages all their financial and personal affairs and is actively  engaged. They could relate day,date,year and events; recalled 2/3 objects at 3 minutes; performed clock-face test normally.  The following portions of the patient's history were reviewed and updated as appropriate: allergies, current medications, past family history, past medical history,  past surgical history, past social history  and problem list.  Visual acuity was not assessed per patient preference since she has regular follow up with her ophthalmologist. Hearing and body mass index were assessed and reviewed.   During the course of the visit the patient was educated and counseled about appropriate screening and preventive services including : fall prevention , diabetes screening, nutrition counseling, colorectal cancer screening, and recommended immunizations.    Objective  BP 124/76  Pulse 71  Temp 98.6 F (37 C) (Oral)  Ht 5' 5.5" (1.664 m)  Wt 150 lb 8 oz (68.266 kg)  BMI 24.66 kg/m2  SpO2 97%  General appearance: alert, cooperative and appears stated age Ears: normal TM's and external ear canals both ears Throat: lips, mucosa, and tongue normal; teeth and gums normal Neck: no adenopathy, no carotid bruit, supple, symmetrical, trachea midline and thyroid not enlarged, symmetric, no tenderness/mass/nodules Back: symmetric, no curvature. ROM normal. No CVA tenderness. Lungs: clear to auscultation bilaterally Heart: regular rate and rhythm, S1, S2 normal, no murmur, click, rub or gallop Abdomen: soft, non-tender; bowel sounds normal; no masses,  no organomegaly Pulses: 2+ and symmetric Skin: Skin color, texture, turgor normal. No rashes or lesions Lymph nodes: Cervical, supraclavicular, and axillary nodes normal.  Assessment and Plan  Osteopenia Managed with diet and weight bearing exercise, alst DEXA June 2012.   Inflammatory polyarthritis Managed with Placquenil and celebrex by Dr. Gavin Potters.   Diabetes mellitus with neuropathy Well controlled on current medications.   No changes today. Foot exam was normal last visit. LDL not at goal,  Suspect Crestor has been stopped.   Routine general medical examination at a health care facility Annual medicare exam done today with breast and pelvic done .  Mammogram ordered in August not done., reordered,    Mixed hyperlipidemia Currently uncontrolled, with history of statin intolence due to mylagias of arms and legs but apparently tolerating Cresotor and Trilipix.   I suspect sh is no longer taking the crestor  Low GI diet recommended.   Hypertension Well controlled on current regimen. Renal function stable, no changes today.   Updated Medication List Outpatient Encounter Prescriptions as of 08/27/2012  Medication Sig Dispense Refill  . albuterol (PROAIR HFA) 108 (90 BASE) MCG/ACT inhaler Inhale 2 puffs into the lungs every 4 (four) hours as needed for wheezing.  1 Inhaler  2  . CELEBREX 200 MG capsule TAKE ONE CAPSULE BY MOUTH TWICE A DAY  60 capsule  3  . fexofenadine (ALLEGRA) 180 MG tablet Take 180 mg by mouth daily.      Marland Kitchen FLUoxetine (PROZAC) 40 MG capsule TAKE 1 CAPSULE (40 MG TOTAL) BY MOUTH DAILY.  90 capsule  0  . fluticasone (FLONASE) 50 MCG/ACT nasal spray Place 2 sprays into the nose daily.  16 g  6  . gabapentin (NEURONTIN) 300 MG capsule TAKE 1 CAPSULE BY MOUTH AT BREAKFAST AND LUNCH AND 2 CAPSULES BY MOUTH AT BEDTIME  120 capsule  2  . levothyroxine (SYNTHROID, LEVOTHROID) 100 MCG tablet TAKE 1 TABLET BY MOUTH EVERY DAY  30 tablet  3  . lisinopril-hydrochlorothiazide (PRINZIDE,ZESTORETIC) 10-12.5 MG per tablet TAKE 1 TABLET BY MOUTH EVERY DAY  30 tablet  5  . Multiple Vitamins-Minerals (SENIOR MULTIVITAMIN PLUS) TABS Take 1 tablet by mouth.        . Omega-3 Fatty Acids (FISH OIL) 1000 MG CAPS Take 1 capsule by mouth daily.      . [DISCONTINUED] rosuvastatin (CRESTOR) 20 MG tablet Take 1 tablet (20 mg total) by mouth daily.  30 tablet  5  . ALPRAZolam (XANAX) 0.25 MG tablet Take 1 tablet (0.25 mg  total) by mouth 3 (three) times daily as needed.  90 tablet  0  . [DISCONTINUED] ALPRAZolam (XANAX) 0.25 MG tablet Take 1 tablet (0.25 mg total) by mouth 3 (three) times daily as needed.  30 tablet  0

## 2012-08-30 ENCOUNTER — Encounter: Payer: Self-pay | Admitting: Internal Medicine

## 2012-08-30 ENCOUNTER — Telehealth: Payer: Self-pay | Admitting: Internal Medicine

## 2012-08-30 DIAGNOSIS — Z0000 Encounter for general adult medical examination without abnormal findings: Secondary | ICD-10-CM | POA: Insufficient documentation

## 2012-08-30 DIAGNOSIS — E785 Hyperlipidemia, unspecified: Secondary | ICD-10-CM

## 2012-08-30 NOTE — Assessment & Plan Note (Signed)
Annual medicare exam done today with breast and pelvic done .  Mammogram ordered in August not done., reordered,

## 2012-08-30 NOTE — Progress Notes (Signed)
Already on treatment

## 2012-08-30 NOTE — Assessment & Plan Note (Signed)
Well controlled on current regimen. Renal function stable, no changes today. 

## 2012-08-30 NOTE — Assessment & Plan Note (Signed)
Well controlled on current medications.  No changes today. Foot exam was normal last visit. LDL not at goal,  Suspect Crestor has been stopped.

## 2012-08-30 NOTE — Telephone Encounter (Signed)
Please confirm with patient whether she is taking the crestor anymore. After reviewing herlast labs and her  chart more closely, there are inconsistencies (your review of her meds states "yes" but refill history says otherwise)

## 2012-08-30 NOTE — Assessment & Plan Note (Signed)
Managed with Placquenil and celebrex by Dr. Gavin Potters.

## 2012-08-30 NOTE — Assessment & Plan Note (Signed)
Managed with diet and weight bearing exercise, alst DEXA June 2012.

## 2012-08-30 NOTE — Assessment & Plan Note (Addendum)
Currently uncontrolled, with history of statin intolence due to mylagias of arms and legs but apparently tolerating Cresotor and Trilipix.   I suspect sh is no longer taking the crestor  Low GI diet recommended.

## 2012-09-01 NOTE — Telephone Encounter (Signed)
Ask to to increase crestor dsoe to daily if she can tolerate it and repeat fasting lipdis in 6 weeks.

## 2012-09-01 NOTE — Telephone Encounter (Signed)
Spoke to patient she stated that she takes the Crestor 3 days a week.

## 2012-09-01 NOTE — Telephone Encounter (Signed)
Spoke to patient gave her instructions as directed. 

## 2012-09-17 ENCOUNTER — Other Ambulatory Visit: Payer: Self-pay | Admitting: Medical

## 2012-09-22 ENCOUNTER — Other Ambulatory Visit: Payer: Self-pay | Admitting: Internal Medicine

## 2012-09-22 DIAGNOSIS — R928 Other abnormal and inconclusive findings on diagnostic imaging of breast: Secondary | ICD-10-CM

## 2012-09-22 NOTE — Telephone Encounter (Signed)
Her mammogram was incomplete and they need additional views oon both sides and her old films.  When she goes back for her additional views, she needs to tell them where her prior ones were done. New orders on printer

## 2012-09-24 NOTE — Telephone Encounter (Signed)
Med filled.  

## 2012-09-25 ENCOUNTER — Telehealth: Payer: Self-pay | Admitting: Internal Medicine

## 2012-09-25 LAB — HM MAMMOGRAPHY: HM Mammogram: NORMAL

## 2012-09-25 NOTE — Telephone Encounter (Signed)
has [name] Day been contacted by [facility] about needing additional views of the left breast?

## 2012-09-26 NOTE — Telephone Encounter (Signed)
Pt was called yesterday and has an appt for today.

## 2012-10-07 ENCOUNTER — Encounter: Payer: Self-pay | Admitting: Internal Medicine

## 2012-10-21 ENCOUNTER — Other Ambulatory Visit: Payer: Self-pay | Admitting: Internal Medicine

## 2012-11-06 ENCOUNTER — Other Ambulatory Visit: Payer: Self-pay | Admitting: Internal Medicine

## 2012-11-26 ENCOUNTER — Ambulatory Visit: Payer: Medicare Other | Admitting: Internal Medicine

## 2012-12-24 ENCOUNTER — Encounter: Payer: Self-pay | Admitting: Internal Medicine

## 2012-12-24 ENCOUNTER — Ambulatory Visit (INDEPENDENT_AMBULATORY_CARE_PROVIDER_SITE_OTHER): Payer: Medicare Other | Admitting: Internal Medicine

## 2012-12-24 VITALS — BP 136/80 | HR 68 | Temp 98.3°F | Resp 16 | Wt 152.2 lb

## 2012-12-24 DIAGNOSIS — E1142 Type 2 diabetes mellitus with diabetic polyneuropathy: Secondary | ICD-10-CM

## 2012-12-24 DIAGNOSIS — E1140 Type 2 diabetes mellitus with diabetic neuropathy, unspecified: Secondary | ICD-10-CM

## 2012-12-24 DIAGNOSIS — I10 Essential (primary) hypertension: Secondary | ICD-10-CM

## 2012-12-24 DIAGNOSIS — J309 Allergic rhinitis, unspecified: Secondary | ICD-10-CM

## 2012-12-24 DIAGNOSIS — E782 Mixed hyperlipidemia: Secondary | ICD-10-CM

## 2012-12-24 DIAGNOSIS — E1149 Type 2 diabetes mellitus with other diabetic neurological complication: Secondary | ICD-10-CM

## 2012-12-24 LAB — LDL CHOLESTEROL, DIRECT: Direct LDL: 96.9 mg/dL

## 2012-12-24 LAB — COMPREHENSIVE METABOLIC PANEL
AST: 26 U/L (ref 0–37)
BUN: 16 mg/dL (ref 6–23)
Calcium: 9.5 mg/dL (ref 8.4–10.5)
Chloride: 100 mEq/L (ref 96–112)
Creatinine, Ser: 0.7 mg/dL (ref 0.4–1.2)
Glucose, Bld: 96 mg/dL (ref 70–99)

## 2012-12-24 LAB — LIPID PANEL
Cholesterol: 173 mg/dL (ref 0–200)
HDL: 37.2 mg/dL — ABNORMAL LOW (ref >39.00)
Triglycerides: 242 mg/dL — ABNORMAL HIGH (ref 0.0–149.0)
VLDL: 48.4 mg/dL — ABNORMAL HIGH (ref 0.0–40.0)

## 2012-12-24 LAB — HM DIABETES FOOT EXAM: HM Diabetic Foot Exam: NORMAL

## 2012-12-24 MED ORDER — ROSUVASTATIN CALCIUM 10 MG PO TABS: 10.0000 mg | ORAL_TABLET | Freq: Every day | ORAL | Status: DC

## 2012-12-24 NOTE — Patient Instructions (Addendum)
Check bp a few times before next follow up .  Goal s 130/80 or less  If consistently high, we will increase  the lisinopril with next refill.   Labs today

## 2012-12-25 NOTE — Progress Notes (Signed)
Patient ID: [name] Day, female   DOB: 1945/12/02, 67 y.o.   MRN: [id number]  Patient Active Problem List  Diagnosis  . Mixed hyperlipidemia  . Unspecified hypothyroidism  . Osteopenia  . Anxiety state, unspecified  . Allergic rhinitis  . GERD (gastroesophageal reflux disease)  . Hypertension  . Diabetes mellitus with neuropathy  . Inflammatory polyarthritis  . Routine general medical examination at a health care facility    Subjective:  CC:   Chief Complaint  Patient presents with  . Follow-up    after physical     HPI:   [name] Brumbaugh Kirchbaumis a 67 y.o. female who presents for three-month followup on diet-controlled diabetes, hypertension and hyperlipidemia. She has been tolerating Crestor trial without myalgias. She has prior statin intolerance. She is exercising about twice or 3 times a week. She has not lost any weight. She's had no low blood sugars. The metformin was stopped at last visit. Random blood sugars have been tested and have been under 120 fasting and under 150 post prandial.  Past Medical History  Diagnosis Date  . Lichen sclerosus et atrophicus of the vulva 01/2011  . allergic rhinitis   . COPD (chronic obstructive pulmonary disease)     PFTS 2013 FEV1 nearly normalized  . Depression   . Diabetes mellitus   . GERD (gastroesophageal reflux disease)   . Hyperlipidemia   . Hypertension   . Neuropathy     diabetic  . Hypothyroid 2002    Past Surgical History  Procedure Laterality Date  . Colonoscopy  2004  . Back surgery  2000  . Abdominal hysterectomy  1978  . Spine surgery      L3 to L5       The following portions of the patient's history were reviewed and updated as appropriate: Allergies, current medications, and problem list.    Review of Systems:   Patient denies headache, fevers, malaise, unintentional weight loss, skin rash, eye pain, sinus pain , sore throat, dysphagia,  hemoptysis , cough, dyspnea, wheezing, chest pain,  palpitations, orthopnea, edema, abdominal pain, nausea, melena, diarrhea, constipation, flank pain, dysuria, hematuria, urinary  Frequency, nocturia, numbness, tingling, seizures,  Focal weakness, Loss of consciousness,  Tremor, insomnia, depression, anxiety, and suicidal ideation.     History   Social History  . Marital Status: Widowed    Spouse Name: N/A    Number of Children: N/A  . Years of Education: N/A   Occupational History  . 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 Smoking status: Former Smoker -- 25 years    Types: Cigarettes    Quit date: 09/17/1993  . Smokeless tobacco: Never Used  . Alcohol Use: No  . Drug Use: No  . Sexually Active: Not on file   Other Topics Concern  . Not on file   Social History Narrative  . No narrative on file    Objective:  BP 136/80  Pulse 68  Temp(Src) 98.3 F (36.8 C) (Oral)  Resp 16  Wt 152 lb 4 oz (69.06 kg)  BMI 24.94 kg/m2  SpO2 98%  General appearance: alert, cooperative and appears stated age Ears: normal TM's and external ear canals both ears Throat: lips, mucosa, and tongue normal; teeth and gums normal Neck: no adenopathy, no carotid bruit, supple, symmetrical, trachea midline and thyroid not enlarged, symmetric, no tenderness/mass/nodules Back: symmetric, no curvature. ROM normal. No CVA tenderness. Lungs: clear to auscultation bilaterally Heart: regular rate and  rhythm, S1, S2 normal, no murmur, click, rub or gallop Abdomen: soft, non-tender; bowel sounds normal; no masses,  no organomegaly Pulses: 2+ and symmetric Skin: Skin color, texture, turgor normal. No rashes or lesions Lymph nodes: Cervical, supraclavicular, and axillary nodes normal. Foot exam:  Nails are well trimmed,  No callouses,  Sensation intact to microfilament  Assessment and Plan:  Mixed hyperlipidemia Well controlled, LDL is now< 100, which is a drop of 100 pts on current Crestor therapy. Her  triglycerides are still a little bit elevated at 242. She does not need additional medication for this right yet, because this should come down with exercise and low carbohydrate diet. Liver and kidney function are normal. Repeat CMET and lipids in 6 months      Diabetes mellitus with neuropathy Well-controlled on diet alone. A1c is less than 6.0. Metformin was stopped at last visit. She has mild neuropathy but continues to have a normal foot exam. Continue gabapentin and ACE inhibitor. Up-to-date on annual eye exams.  Hypertension Mildly elevated today on lisinopril/hydrochlorothiazide, but she has been taking decongestants for allergic rhinitis. She will recheck at home after suspending decongestants for 48 hours. She has no proteinuria.  Allergic rhinitis She's been using a decongestant rather than any histamine. Discussion of available over-the-counter medications for symptom control versus use of steroid nasal sprays. She prefers to use over-the-counter antihistamines. Recommended to augment antihistamines with twice-daily saline flushes during the pollen season.   Updated Medication List Outpatient Encounter Prescriptions as of 12/24/2012  Medication Sig Dispense Refill  . albuterol (PROAIR HFA) 108 (90 BASE) MCG/ACT inhaler Inhale 2 puffs into the lungs every 4 (four) hours as needed for wheezing.  1 Inhaler  2  . ALPRAZolam (XANAX) 0.25 MG tablet Take 1 tablet (0.25 mg total) by mouth 3 (three) times daily as needed.  90 tablet  0  . CELEBREX 200 MG capsule TAKE ONE CAPSULE BY MOUTH TWICE A DAY  60 capsule  3  . fexofenadine (ALLEGRA) 180 MG tablet Take 180 mg by mouth daily.      Marland Kitchen FLUoxetine (PROZAC) 40 MG capsule TAKE 1 CAPSULE (40 MG TOTAL) BY MOUTH DAILY.  90 capsule  0  . gabapentin (NEURONTIN) 300 MG capsule TAKE ONE CAPSULE BY MOUTH AT BREAKFAST & LUNCH AND 2 CAPSULES AT BEDTIME  120 capsule  2  . levothyroxine (SYNTHROID, LEVOTHROID) 100 MCG tablet TAKE 1 TABLET BY MOUTH EVERY  DAY  30 tablet  3  . lisinopril-hydrochlorothiazide (PRINZIDE,ZESTORETIC) 10-12.5 MG per tablet TAKE 1 TABLET BY MOUTH EVERY DAY  30 tablet  5  . Multiple Vitamins-Minerals (SENIOR MULTIVITAMIN PLUS) TABS Take 1 tablet by mouth.        . Omega-3 Fatty Acids (FISH OIL) 1000 MG CAPS Take 1 capsule by mouth daily.      . fluticasone (FLONASE) 50 MCG/ACT nasal spray Place 2 sprays into the nose daily.  16 g  6  . rosuvastatin (CRESTOR) 10 MG tablet Take 1 tablet (10 mg total) by mouth daily.  30 tablet  3   No facility-administered encounter medications on file as of 12/24/2012.     Orders Placed This Encounter  Procedures  . [facility] MAMMOGRAPHY  . Lipid panel  . Hemoglobin A1c  . Comprehensive metabolic panel  . LDL cholesterol, direct  . [facility] DIABETES FOOT EXAM    Return in about 3 months (around 03/25/2013).

## 2012-12-27 NOTE — Assessment & Plan Note (Signed)
Well-controlled on diet alone. A1c is less than 6.0. Metformin was stopped at last visit. She has mild neuropathy but continues to have a normal foot exam. Continue gabapentin and ACE inhibitor. Up-to-date on annual eye exams.

## 2012-12-27 NOTE — Assessment & Plan Note (Signed)
She's been using a decongestant rather than any histamine. Discussion of available over-the-counter medications for symptom control versus use of steroid nasal sprays. She prefers to use over-the-counter antihistamines. Recommended to augment antihistamines with twice-daily saline flushes during the pollen season.

## 2012-12-27 NOTE — Assessment & Plan Note (Signed)
Well controlled, LDL is now< 100, which is a drop of 100 pts on current Crestor therapy. Her triglycerides are still a little bit elevated at 242. She does not need additional medication for this right yet, because this should come down with exercise and low carbohydrate diet. Liver and kidney function are normal. Repeat CMET and lipids in 6 months

## 2012-12-27 NOTE — Assessment & Plan Note (Signed)
Mildly elevated today on lisinopril/hydrochlorothiazide, but she has been taking decongestants for allergic rhinitis. She will recheck at home after suspending decongestants for 48 hours. She has no proteinuria.

## 2013-01-02 ENCOUNTER — Other Ambulatory Visit: Payer: Self-pay | Admitting: Internal Medicine

## 2013-01-13 ENCOUNTER — Other Ambulatory Visit: Payer: Self-pay | Admitting: Internal Medicine

## 2013-01-28 ENCOUNTER — Other Ambulatory Visit: Payer: Self-pay | Admitting: *Deleted

## 2013-01-28 NOTE — Telephone Encounter (Signed)
Please advise as to refill last Office visit 4/14

## 2013-01-29 ENCOUNTER — Other Ambulatory Visit: Payer: Self-pay | Admitting: Internal Medicine

## 2013-01-31 ENCOUNTER — Emergency Department: Payer: Self-pay | Admitting: Emergency Medicine

## 2013-02-11 MED ORDER — GABAPENTIN 300 MG PO CAPS: ORAL_CAPSULE | ORAL | Status: DC

## 2013-02-11 NOTE — Telephone Encounter (Signed)
The last message was sent in error to the wrong chart due to an EPIC nightmare,  Ms [name] Day has not bene fired!  Please let pharmacy know since the message went to them

## 2013-02-11 NOTE — Telephone Encounter (Signed)
Patient is no longer my patient.,  He received a letter over a month ago.  I will refill for one month only

## 2013-02-24 ENCOUNTER — Other Ambulatory Visit: Payer: Self-pay | Admitting: Internal Medicine

## 2013-03-25 ENCOUNTER — Ambulatory Visit (INDEPENDENT_AMBULATORY_CARE_PROVIDER_SITE_OTHER): Payer: Medicare Other | Admitting: Internal Medicine

## 2013-03-25 ENCOUNTER — Encounter: Payer: Self-pay | Admitting: Internal Medicine

## 2013-03-25 VITALS — BP 116/72 | HR 70 | Temp 98.4°F | Resp 16 | Wt 151.2 lb

## 2013-03-25 DIAGNOSIS — E1149 Type 2 diabetes mellitus with other diabetic neurological complication: Secondary | ICD-10-CM

## 2013-03-25 DIAGNOSIS — E039 Hypothyroidism, unspecified: Secondary | ICD-10-CM

## 2013-03-25 DIAGNOSIS — E860 Dehydration: Secondary | ICD-10-CM

## 2013-03-25 DIAGNOSIS — R002 Palpitations: Secondary | ICD-10-CM

## 2013-03-25 DIAGNOSIS — E1140 Type 2 diabetes mellitus with diabetic neuropathy, unspecified: Secondary | ICD-10-CM

## 2013-03-25 DIAGNOSIS — E1142 Type 2 diabetes mellitus with diabetic polyneuropathy: Secondary | ICD-10-CM

## 2013-03-25 DIAGNOSIS — D751 Secondary polycythemia: Secondary | ICD-10-CM

## 2013-03-25 DIAGNOSIS — E538 Deficiency of other specified B group vitamins: Secondary | ICD-10-CM

## 2013-03-25 DIAGNOSIS — E785 Hyperlipidemia, unspecified: Secondary | ICD-10-CM

## 2013-03-25 LAB — CBC WITH DIFFERENTIAL/PLATELET
Basophils Relative: 0.8 % (ref 0.0–3.0)
Eosinophils Relative: 2.3 % (ref 0.0–5.0)
Hemoglobin: 15.6 g/dL — ABNORMAL HIGH (ref 12.0–15.0)
Lymphocytes Relative: 18 % (ref 12.0–46.0)
Monocytes Relative: 9.2 % (ref 3.0–12.0)
Neutro Abs: 5.9 10*3/uL (ref 1.4–7.7)
RBC: 5.2 Mil/uL — ABNORMAL HIGH (ref 3.87–5.11)
WBC: 8.5 10*3/uL (ref 4.5–10.5)

## 2013-03-25 LAB — TSH: TSH: 2.11 u[IU]/mL (ref 0.35–5.50)

## 2013-03-25 LAB — HM DIABETES FOOT EXAM

## 2013-03-25 LAB — LIPID PANEL
HDL: 42.3 mg/dL (ref >39.00)
Triglycerides: 308 mg/dL — ABNORMAL HIGH (ref 0.0–149.0)

## 2013-03-25 LAB — COMPREHENSIVE METABOLIC PANEL
Alkaline Phosphatase: 66 U/L (ref 39–117)
BUN: 23 mg/dL (ref 6–23)
Creatinine, Ser: 0.7 mg/dL (ref 0.4–1.2)
Glucose, Bld: 95 mg/dL (ref 70–99)
Sodium: 140 mEq/L (ref 135–145)
Total Bilirubin: 0.8 mg/dL (ref 0.3–1.2)
Total Protein: 7.2 g/dL (ref 6.0–8.3)

## 2013-03-25 LAB — LDL CHOLESTEROL, DIRECT: Direct LDL: 117.8 mg/dL

## 2013-03-25 LAB — MICROALBUMIN / CREATININE URINE RATIO
Creatinine,U: 172.1 mg/dL
Microalb, Ur: 0.6 mg/dL (ref 0.0–1.9)

## 2013-03-25 LAB — VITAMIN B12: Vitamin B-12: 636 pg/mL (ref 211–911)

## 2013-03-25 LAB — MAGNESIUM: Magnesium: 2 mg/dL (ref 1.5–2.5)

## 2013-03-25 NOTE — Progress Notes (Signed)
Patient ID: [name] Day, female   DOB: 09/23/1945, 67 y.o.   MRN: [id number]  Patient Active Problem List   Diagnosis Date Noted  . Palpitations 03/25/2013  . Routine general medical examination at a health care facility 08/30/2012  . Inflammatory polyarthritis 05/12/2012  . Diabetes mellitus with neuropathy 02/10/2012  . GERD (gastroesophageal reflux disease)   . Hypertension   . Allergic rhinitis 01/14/2012  . Anxiety state, unspecified 05/14/2011  . Osteopenia 03/01/2011  . Mixed hyperlipidemia 01/31/2011  . Other and unspecified hyperlipidemia 01/31/2011    Subjective:  CC:   Chief Complaint  Patient presents with  . Follow-up    DM    HPI:   [name] Deamer Kirchbaumis a 67 y.o. female who presents for a 3 month follow up on diabetes mellitus.  New cc;  Recurrent episodes of racing heart accompanied by diaphoresis.  Her cbg was not low at the time.  Has been occurring since June .  Has been noticing more hair loss on face and scalp.  Episode of brain fog lasting 3 days, felt totally confused for 3 days, no neurologic symptoms ,  No slurred speech, but was walking slower and friends felt she was out of it , had a slight headache for 3 days, no urinary symptoms.      Past Medical History  Diagnosis Date  . Lichen sclerosus et atrophicus of the vulva 01/2011  . allergic rhinitis   . COPD (chronic obstructive pulmonary disease)     PFTS 2013 FEV1 nearly normalized  . Depression   . Diabetes mellitus   . GERD (gastroesophageal reflux disease)   . Hyperlipidemia   . Hypertension   . Neuropathy     diabetic  . Hypothyroid 2002    Past Surgical History  Procedure Laterality Date  . Colonoscopy  2004  . Back surgery  2000  . Abdominal hysterectomy  1978  . Spine surgery      L3 to L5       The following portions of the patient's history were reviewed and updated as appropriate: Allergies, current medications, and problem list.    Review of Systems:   12 Pt   review of systems was negative except those addressed in the HPI,     History   Social History  . Marital Status: Widowed    Spouse Name: N/A    Number of Children: N/A  . Years of Education: N/A   Occupational History  . 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 Smoking status: Former Smoker -- 25 years    Types: Cigarettes    Quit date: 09/17/1993  . Smokeless tobacco: Never Used  . Alcohol Use: No  . Drug Use: No  . Sexually Active: Not on file   Other Topics Concern  . Not on file   Social History Narrative  . No narrative on file    Objective:  BP 116/72  Pulse 70  Temp(Src) 98.4 F (36.9 C) (Oral)  Resp 16  Wt 151 lb 4 oz (68.607 kg)  BMI 24.78 kg/m2  SpO2 97%  General appearance: alert, cooperative and appears stated age Ears: normal TM's and external ear canals both ears Throat: lips, mucosa, and tongue normal; teeth and gums normal Neck: no adenopathy, no carotid bruit, supple, symmetrical, trachea midline and thyroid not enlarged, symmetric, no tenderness/mass/nodules Back: symmetric, no curvature. ROM normal. No CVA tenderness. Lungs: clear to auscultation bilaterally Heart: regular rate and  rhythm, S1, S2 normal, no murmur, click, rub or gallop Abdomen: soft, non-tender; bowel sounds normal; no masses,  no organomegaly Pulses: 2+ and symmetric Skin: Skin color, texture, turgor normal. No rashes or lesions Lymph nodes: Cervical, supraclavicular, and axillary nodes normal. Foot exam:  Nails are well trimmed,  No callouses,  Sensation intact to microfilament  Assessment and Plan:  Palpitations EKG today is normal .  Lytes and thyroid are normal.  Refer to cardiology for cardionet.   Other and unspecified hyperlipidemia LDL at goal on current medications but triglycerides are high .  Will add fenofibrate.   Diabetes mellitus with neuropathy Well-controlled on current medications.  hemoglobin A1c has  been consistently less than 7.0 . She He is up-to-date on eye exams and his foot exam is chronically abnormal.  She has no microalbuminuria.  She is  on the appropriate medications.   Updated Medication List Outpatient Encounter Prescriptions as of 03/25/2013  Medication Sig Dispense Refill  . albuterol (PROAIR HFA) 108 (90 BASE) MCG/ACT inhaler Inhale 2 puffs into the lungs every 4 (four) hours as needed for wheezing.  1 Inhaler  2  . ALPRAZolam (XANAX) 0.25 MG tablet Take 1 tablet (0.25 mg total) by mouth 3 (three) times daily as needed.  90 tablet  0  . CELEBREX 200 MG capsule TAKE ONE CAPSULE BY MOUTH TWICE A DAY  60 capsule  3  . fexofenadine (ALLEGRA) 180 MG tablet Take 180 mg by mouth daily.      Marland Kitchen FLUoxetine (PROZAC) 40 MG capsule TAKE 1 CAPSULE BY MOUTH EVERY DAY  90 capsule  0  . gabapentin (NEURONTIN) 300 MG capsule TAKE ONE CAPSULE BY MOUTH AT BREAKFAST & LUNCH AND 2 CAPSULES AT BEDTIME  120 capsule  0  . hydroxychloroquine (PLAQUENIL) 200 MG tablet Take 200 mg by mouth daily.      Marland Kitchen levothyroxine (SYNTHROID, LEVOTHROID) 100 MCG tablet TAKE 1 TABLET BY MOUTH EVERY DAY  30 tablet  5  . lisinopril-hydrochlorothiazide (PRINZIDE,ZESTORETIC) 10-12.5 MG per tablet TAKE 1 TABLET BY MOUTH EVERY DAY  30 tablet  5  . Multiple Vitamins-Minerals (SENIOR MULTIVITAMIN PLUS) TABS Take 1 tablet by mouth.        . Omega-3 Fatty Acids (FISH OIL) 1000 MG CAPS Take 1 capsule by mouth daily.      . rosuvastatin (CRESTOR) 10 MG tablet Take 1 tablet (10 mg total) by mouth daily.  30 tablet  3  . fluticasone (FLONASE) 50 MCG/ACT nasal spray Place 2 sprays into the nose daily.  16 g  6   No facility-administered encounter medications on file as of 03/25/2013.

## 2013-03-25 NOTE — Patient Instructions (Addendum)
Your palpitations may be coming from episodes or atrial fibrillation, or from electrolyte or thyroid imbalances.   I am referring you to [facility] cardiology so they can observe your cardiacs rhythms with a heart monitor.

## 2013-03-26 ENCOUNTER — Encounter: Payer: Self-pay | Admitting: Internal Medicine

## 2013-03-26 LAB — FOLATE RBC: RBC Folate: 816 ng/mL (ref >=366)

## 2013-03-26 NOTE — Assessment & Plan Note (Signed)
EKG today is normal .  Lytes and thyroid are normal.  Refer to cardiology for cardionet.

## 2013-03-26 NOTE — Progress Notes (Signed)
Patient ID: [name] Day, female   DOB: 11-23-1945, 67 y.o.   MRN: [id number]  Patient Active Problem List   Diagnosis Date Noted  . Palpitations 03/25/2013  . Routine general medical examination at a health care facility 08/30/2012  . Inflammatory polyarthritis 05/12/2012  . Diabetes mellitus with neuropathy 02/10/2012  . GERD (gastroesophageal reflux disease)   . Hypertension   . Allergic rhinitis 01/14/2012  . Anxiety state, unspecified 05/14/2011  . Osteopenia 03/01/2011  . Mixed hyperlipidemia 01/31/2011  . Other and unspecified hyperlipidemia 01/31/2011    Subjective:  CC:   Chief Complaint  Patient presents with  . Follow-up    DM    HPI:   [name] Koskela Kirchbaumis a 67 y.o. female who presents for a 3 month follow up on diabetes mellitus.  New cc;  Recurrent episodes of racing heart accompanied by diaphoresis.  Her cbg was not low at the time.  Has been occurring since June .  Has been noticing more hair loss on face and scalp.  Episode of brain fog lasting 3 days, felt totally confused for 3 days, no neurologic symptoms ,  No slurred speech, but was walking slower and friends felt she was out of it , had a slight headache for 3 days, no urinary symptoms.      Past Medical History  Diagnosis Date  . Lichen sclerosus et atrophicus of the vulva 01/2011  . allergic rhinitis   . COPD (chronic obstructive pulmonary disease)     PFTS 2013 FEV1 nearly normalized  . Depression   . Diabetes mellitus   . GERD (gastroesophageal reflux disease)   . Hyperlipidemia   . Hypertension   . Neuropathy     diabetic  . Hypothyroid 2002    Past Surgical History  Procedure Laterality Date  . Colonoscopy  2004  . Back surgery  2000  . Abdominal hysterectomy  1978  . Spine surgery      L3 to L5       The following portions of the patient's history were reviewed and updated as appropriate: Allergies, current medications, and problem list.    Review of Systems:   12 Pt   review of systems was negative except those addressed in the HPI,     History   Social History  . Marital Status: Widowed    Spouse Name: N/A    Number of Children: N/A  . Years of Education: N/A   Occupational History  . 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 Smoking status: Former Smoker -- 25 years    Types: Cigarettes    Quit date: 09/17/1993  . Smokeless tobacco: Never Used  . Alcohol Use: No  . Drug Use: No  . Sexually Active: Not on file   Other Topics Concern  . Not on file   Social History Narrative  . No narrative on file    Objective:  BP 116/72  Pulse 70  Temp(Src) 98.4 F (36.9 C) (Oral)  Resp 16  Wt 151 lb 4 oz (68.607 kg)  BMI 24.78 kg/m2  SpO2 97%  General appearance: alert, cooperative and appears stated age Ears: normal TM's and external ear canals both ears Throat: lips, mucosa, and tongue normal; teeth and gums normal Neck: no adenopathy, no carotid bruit, supple, symmetrical, trachea midline and thyroid not enlarged, symmetric, no tenderness/mass/nodules Back: symmetric, no curvature. ROM normal. No CVA tenderness. Lungs: clear to auscultation bilaterally Heart: regular rate and  rhythm, S1, S2 normal, no murmur, click, rub or gallop Abdomen: soft, non-tender; bowel sounds normal; no masses,  no organomegaly Pulses: 2+ and symmetric Skin: Skin color, texture, turgor normal. No rashes or lesions Lymph nodes: Cervical, supraclavicular, and axillary nodes normal. Foot exam:  Nails are well trimmed,  No callouses,  Sensation decreased  to microfilament  Assessment and Plan:  Palpitations EKG today is normal .  Lytes and thyroid are normal.  Refer to cardiology for cardionet.   Other and unspecified hyperlipidemia LDL at goal on current medications but triglycerides are high .  Will add fenofibrate.   Diabetes mellitus with neuropathy Well-controlled on current medications.  hemoglobin A1c has  been consistently less than 7.0 . She He is up-to-date on eye exams and his foot exam is chronically abnormal.  She has no microalbuminuria.  She is  on the appropriate medications.  A total of 40 minutes was spent with patient more than half of which was spent in counseling, reviewing records from other prviders and coordination of care.  Updated Medication List Outpatient Encounter Prescriptions as of 03/25/2013  Medication Sig Dispense Refill  . albuterol (PROAIR HFA) 108 (90 BASE) MCG/ACT inhaler Inhale 2 puffs into the lungs every 4 (four) hours as needed for wheezing.  1 Inhaler  2  . ALPRAZolam (XANAX) 0.25 MG tablet Take 1 tablet (0.25 mg total) by mouth 3 (three) times daily as needed.  90 tablet  0  . CELEBREX 200 MG capsule TAKE ONE CAPSULE BY MOUTH TWICE A DAY  60 capsule  3  . fexofenadine (ALLEGRA) 180 MG tablet Take 180 mg by mouth daily.      Marland Kitchen FLUoxetine (PROZAC) 40 MG capsule TAKE 1 CAPSULE BY MOUTH EVERY DAY  90 capsule  0  . gabapentin (NEURONTIN) 300 MG capsule TAKE ONE CAPSULE BY MOUTH AT BREAKFAST & LUNCH AND 2 CAPSULES AT BEDTIME  120 capsule  0  . hydroxychloroquine (PLAQUENIL) 200 MG tablet Take 200 mg by mouth daily.      Marland Kitchen levothyroxine (SYNTHROID, LEVOTHROID) 100 MCG tablet TAKE 1 TABLET BY MOUTH EVERY DAY  30 tablet  5  . lisinopril-hydrochlorothiazide (PRINZIDE,ZESTORETIC) 10-12.5 MG per tablet TAKE 1 TABLET BY MOUTH EVERY DAY  30 tablet  5  . Multiple Vitamins-Minerals (SENIOR MULTIVITAMIN PLUS) TABS Take 1 tablet by mouth.        . Omega-3 Fatty Acids (FISH OIL) 1000 MG CAPS Take 1 capsule by mouth daily.      . rosuvastatin (CRESTOR) 10 MG tablet Take 1 tablet (10 mg total) by mouth daily.  30 tablet  3  . fluticasone (FLONASE) 50 MCG/ACT nasal spray Place 2 sprays into the nose daily.  16 g  6   No facility-administered encounter medications on file as of 03/25/2013.

## 2013-03-26 NOTE — Assessment & Plan Note (Addendum)
LDL at goal on current medications but triglycerides are high .  Will add fenofibrate.

## 2013-03-26 NOTE — Assessment & Plan Note (Signed)
Well-controlled on current medications.  hemoglobin A1c has been consistently less than 7.0 . She He is up-to-date on eye exams and his foot exam is chronically abnormal.  She has no microalbuminuria.  She is  on the appropriate medications.

## 2013-04-03 NOTE — Addendum Note (Signed)
Addended by: Sherlene Shams on: 04/03/2013 01:39 PM   Modules accepted: Orders

## 2013-04-09 ENCOUNTER — Encounter: Payer: Self-pay | Admitting: Cardiovascular Disease

## 2013-04-09 ENCOUNTER — Ambulatory Visit (INDEPENDENT_AMBULATORY_CARE_PROVIDER_SITE_OTHER): Payer: Medicare Other | Admitting: Cardiovascular Disease

## 2013-04-09 ENCOUNTER — Telehealth: Payer: Self-pay | Admitting: *Deleted

## 2013-04-09 VITALS — BP 165/99 | HR 70 | Ht 65.0 in | Wt 151.5 lb

## 2013-04-09 DIAGNOSIS — E782 Mixed hyperlipidemia: Secondary | ICD-10-CM

## 2013-04-09 DIAGNOSIS — R002 Palpitations: Secondary | ICD-10-CM

## 2013-04-09 DIAGNOSIS — E1142 Type 2 diabetes mellitus with diabetic polyneuropathy: Secondary | ICD-10-CM

## 2013-04-09 DIAGNOSIS — E1149 Type 2 diabetes mellitus with other diabetic neurological complication: Secondary | ICD-10-CM

## 2013-04-09 DIAGNOSIS — I10 Essential (primary) hypertension: Secondary | ICD-10-CM

## 2013-04-09 DIAGNOSIS — R0602 Shortness of breath: Secondary | ICD-10-CM | POA: Insufficient documentation

## 2013-04-09 DIAGNOSIS — Z87891 Personal history of nicotine dependence: Secondary | ICD-10-CM | POA: Insufficient documentation

## 2013-04-09 DIAGNOSIS — R000 Tachycardia, unspecified: Secondary | ICD-10-CM

## 2013-04-09 DIAGNOSIS — E1140 Type 2 diabetes mellitus with diabetic neuropathy, unspecified: Secondary | ICD-10-CM

## 2013-04-09 MED ORDER — ROSUVASTATIN CALCIUM 20 MG PO TABS: 20.0000 mg | ORAL_TABLET | Freq: Every day | ORAL | Status: DC

## 2013-04-09 NOTE — Assessment & Plan Note (Signed)
We have suggested she continue on her Crestor. In fact if she is able to tolerate 20 mg daily, we have suggested she try this with goal LDL less than 70 (in light of her seeing previous scans with significant aortic atherosclerosis)

## 2013-04-09 NOTE — Assessment & Plan Note (Signed)
She reports having shortness of breath with exertion, worse since June 2014. We did discuss with her that she is high risk for coronary artery disease. She remembers seeing previous reports of aortic atherosclerosis on previous scanning. We look and could not confirm this. Scans were from 10 years ago. Given her smoking history, unable to exclude CAD or COPD. If symptoms persist or get worse, we would order a stress test or go straight to catheterization.

## 2013-04-09 NOTE — Telephone Encounter (Signed)
Message copied by Kendrick Fries on Thu Apr 09, 2013  4:31 PM ------      Message from: [name]      Created: Thu Apr 09, 2013  3:05 PM      Regarding: 48 hour holter monitor       [name] Day             Pt needs 48 hour holter monitor                  Thanks      Debbie ------

## 2013-04-09 NOTE — Assessment & Plan Note (Signed)
Blood pressure elevated today, improved on recheck. I asked her to monitor her blood pressure. She's does not have a  blood pressure cuff of her own.

## 2013-04-09 NOTE — Patient Instructions (Addendum)
You are doing well.  Please increase the crestor to 20 mg a day We will order a holter monitor for your tachycardia  Please call us if you have new issues that need to be addressed before your next appt.  Follow up in 3 month

## 2013-04-09 NOTE — Assessment & Plan Note (Signed)
Stopped in 1995 per the patient

## 2013-04-09 NOTE — Assessment & Plan Note (Signed)
Etiology of her symptoms is uncertain. Unable to exclude paroxysmal atrial fibrillation or other arrhythmia. Holter monitor has been ordered.

## 2013-04-09 NOTE — Telephone Encounter (Signed)
Faxed 48 hour holter monitor request to Claudette Head at [employer]. They will contact pt to let her know when she can come in for holter monitor to be placed.

## 2013-04-09 NOTE — Progress Notes (Signed)
Patient ID: [name] Day, female    DOB: 12-Dec-1945, 67 y.o.   MRN: [id number]  HPI Comments: Ms. Sax is a pleasant 41 short woman with history of hypertension, long smoking history, COPD, hyperlipidemia, reaction to aspirin, previous episode in 2009 with vision loss, cranial angiography at that time, presenting for evaluation of tachycardia, palpitations, shortness of breath. She reports having previous CT scans showing aortic atherosclerosis.  She reports that in June 2014, she developed tachycardia, racing heart rate with stopping starting rhythm. It would go all day with periodic pauses. She had shortness of breath associated with her symptoms. Her arrhythmia seems to have slowly improved but she does continue to have this. Last time was yesterday. She does have shortness of breath with these episodes. When asked about her exercise, she does report having increased shortness of breath in general, worse in June. At the time when symptoms started, she felt that she was in a "brain fog" and she could not think clearly.  She denies any significant chest pain with exertion. No back pain. She does report having occasional left neck, jaw pain.  Her weight is up from last year . Total cholesterol up from 170 to 190s.   LDL 117 EKG when she saw Dr. Darrick Huntsman showed normal sinus rhythm EKG today shows normal sinus rhythm with rate 70 beats per minute, no significant ST or T wave changes      Outpatient Encounter Prescriptions as of 04/09/2013  Medication Sig Dispense Refill  . albuterol (PROAIR HFA) 108 (90 BASE) MCG/ACT inhaler Inhale 2 puffs into the lungs every 4 (four) hours as needed for wheezing.  1 Inhaler  2  . ALPRAZolam (XANAX) 0.25 MG tablet Take 1 tablet (0.25 mg total) by mouth 3 (three) times daily as needed.  90 tablet  0  . CELEBREX 200 MG capsule TAKE ONE CAPSULE BY MOUTH TWICE A DAY  60 capsule  3  . fexofenadine (ALLEGRA) 180 MG tablet Take 180 mg by mouth daily.      Marland Kitchen  FLUoxetine (PROZAC) 40 MG capsule TAKE 1 CAPSULE BY MOUTH EVERY DAY  90 capsule  0  . fluticasone (FLONASE) 50 MCG/ACT nasal spray Place 2 sprays into the nose daily.  16 g  6  . gabapentin (NEURONTIN) 300 MG capsule TAKE ONE CAPSULE BY MOUTH AT BREAKFAST & LUNCH AND 2 CAPSULES AT BEDTIME  120 capsule  0  . hydroxychloroquine (PLAQUENIL) 200 MG tablet Take 200 mg by mouth daily.      Marland Kitchen levothyroxine (SYNTHROID, LEVOTHROID) 100 MCG tablet TAKE 1 TABLET BY MOUTH EVERY DAY  30 tablet  5  . lisinopril-hydrochlorothiazide (PRINZIDE,ZESTORETIC) 10-12.5 MG per tablet TAKE 1 TABLET BY MOUTH EVERY DAY  30 tablet  5  . Multiple Vitamins-Minerals (SENIOR MULTIVITAMIN PLUS) TABS Take 1 tablet by mouth.        . Omega-3 Fatty Acids (FISH OIL) 1000 MG CAPS Take 1 capsule by mouth daily.      . rosuvastatin (CRESTOR) 20 MG tablet Take 1 tablet (20 mg total) by mouth daily.  30 tablet  6  . [DISCONTINUED] rosuvastatin (CRESTOR) 10 MG tablet Take 1 tablet (10 mg total) by mouth daily.  30 tablet  3   No facility-administered encounter medications on file as of 04/09/2013.     Review of Systems  Constitutional: Positive for fatigue.  HENT: Negative.   Eyes: Negative.   Respiratory: Positive for shortness of breath.   Cardiovascular: Positive for palpitations.  Gastrointestinal: Negative.  Musculoskeletal: Negative.   Skin: Negative.   Neurological: Negative.   Psychiatric/Behavioral: Negative.   All other systems reviewed and are negative.    BP 165/99  Pulse 70  Ht 5\' 5"  (1.651 m)  Wt 151 lb 8 oz (68.72 kg)  BMI 25.21 kg/m2 Blood pressure improved on recheck (" nervous"), 140 systolic  Physical Exam  Nursing note and vitals reviewed. Constitutional: She is oriented to person, place, and time. She appears well-developed and well-nourished.  HENT:  Head: Normocephalic.  Nose: Nose normal.  Mouth/Throat: Oropharynx is clear and moist.  Eyes: Conjunctivae are normal. Pupils are equal, round,  and reactive to light.  Neck: Normal range of motion. Neck supple. No JVD present.  Cardiovascular: Normal rate, regular rhythm, S1 normal, S2 normal, normal heart sounds and intact distal pulses.  Exam reveals no gallop and no friction rub.   No murmur heard. Pulmonary/Chest: Effort normal. No respiratory distress. She has decreased breath sounds. She has no wheezes. She has no rales. She exhibits no tenderness.  Abdominal: Soft. Bowel sounds are normal. She exhibits no distension. There is no tenderness.  Musculoskeletal: Normal range of motion. She exhibits no edema and no tenderness.  Lymphadenopathy:    She has no cervical adenopathy.  Neurological: She is alert and oriented to person, place, and time. Coordination normal.  Skin: Skin is warm and dry. No rash noted. No erythema.  Psychiatric: She has a normal mood and affect. Her behavior is normal. Judgment and thought content normal.    Assessment and Plan

## 2013-04-29 ENCOUNTER — Other Ambulatory Visit: Payer: Self-pay | Admitting: Internal Medicine

## 2013-05-07 ENCOUNTER — Other Ambulatory Visit: Payer: Self-pay | Admitting: Internal Medicine

## 2013-06-16 ENCOUNTER — Ambulatory Visit (INDEPENDENT_AMBULATORY_CARE_PROVIDER_SITE_OTHER): Payer: Medicare Other | Admitting: *Deleted

## 2013-06-16 DIAGNOSIS — Z23 Encounter for immunization: Secondary | ICD-10-CM

## 2013-07-05 ENCOUNTER — Other Ambulatory Visit: Payer: Self-pay | Admitting: Internal Medicine

## 2013-07-10 ENCOUNTER — Ambulatory Visit: Payer: Medicare Other | Admitting: Cardiovascular Disease

## 2013-08-20 LAB — HM DIABETES EYE EXAM

## 2013-09-15 ENCOUNTER — Other Ambulatory Visit: Payer: Self-pay | Admitting: Internal Medicine

## 2013-09-28 ENCOUNTER — Ambulatory Visit: Payer: Medicare Other | Admitting: Cardiovascular Disease

## 2013-09-29 ENCOUNTER — Ambulatory Visit (INDEPENDENT_AMBULATORY_CARE_PROVIDER_SITE_OTHER): Payer: Medicare PPO | Admitting: Cardiovascular Disease

## 2013-09-29 ENCOUNTER — Encounter: Payer: Self-pay | Admitting: Cardiovascular Disease

## 2013-09-29 VITALS — BP 132/80 | HR 88 | Ht 64.0 in | Wt 151.5 lb

## 2013-09-29 DIAGNOSIS — E1140 Type 2 diabetes mellitus with diabetic neuropathy, unspecified: Secondary | ICD-10-CM

## 2013-09-29 DIAGNOSIS — I10 Essential (primary) hypertension: Secondary | ICD-10-CM

## 2013-09-29 DIAGNOSIS — R002 Palpitations: Secondary | ICD-10-CM

## 2013-09-29 DIAGNOSIS — E1142 Type 2 diabetes mellitus with diabetic polyneuropathy: Secondary | ICD-10-CM

## 2013-09-29 DIAGNOSIS — E1149 Type 2 diabetes mellitus with other diabetic neurological complication: Secondary | ICD-10-CM

## 2013-09-29 DIAGNOSIS — R0789 Other chest pain: Secondary | ICD-10-CM

## 2013-09-29 DIAGNOSIS — E782 Mixed hyperlipidemia: Secondary | ICD-10-CM

## 2013-09-29 MED ORDER — AMLODIPINE BESYLATE 10 MG PO TABS: 10.0000 mg | ORAL_TABLET | Freq: Every day | ORAL | Status: DC

## 2013-09-29 MED ORDER — DILTIAZEM HCL 30 MG PO TABS: 30.0000 mg | ORAL_TABLET | Freq: Three times a day (TID) | ORAL | Status: DC | PRN

## 2013-09-29 NOTE — Assessment & Plan Note (Signed)
Cholesterol is at goal on the current lipid regimen. No changes to the medications were made.  

## 2013-09-29 NOTE — Progress Notes (Signed)
Patient ID: [name] Day, female    DOB: 1946/05/10, 68 y.o.   MRN: [id number]  HPI Comments: [name] Day is a pleasant 68 yo woman with history of hypertension, long smoking history, COPD, hyperlipidemia, reaction to aspirin, previous episode in 2009 with vision loss, cranial angiography at that time, previous episodes of tachycardia, palpitations, shortness of breath. She reports having previous CT scans showing aortic atherosclerosis.   in June 2014, she developed tachycardia, racing heart rate with stopping starting rhythm. periodic pauses. shortness of breath associated with her symptoms  In followup today, she reports that she is doing well. Denies any significant arrhythmia. Recent diagnosis of possible Raynaud's-like illness. Seen by Dr. Abundio Miu  Of rheumatology She is interested in changing her lisinopril HCTZ to a calcium channel blocker such as amlodipine for symptom release of Raynaud's She denies any significant chest pain with exertion. No back pain.  She does report having palpitations sometimes at nighttime when going to sleep   Total cholesterol up from 170 to 190s.   LDL 117  EKG today shows normal sinus rhythm with rate 88 beats per minute, no significant ST or T wave changes      Outpatient Encounter Prescriptions as of 09/29/2013  Medication Sig  . albuterol (PROAIR HFA) 108 (90 BASE) MCG/ACT inhaler Inhale 2 puffs into the lungs every 4 (four) hours as needed for wheezing.  Marland Kitchen ALPRAZolam (XANAX) 0.25 MG tablet Take 1 tablet (0.25 mg total) by mouth 3 (three) times daily as needed.  . CELEBREX 200 MG capsule TAKE ONE CAPSULE BY MOUTH TWICE A DAY  . fexofenadine (ALLEGRA) 180 MG tablet Take 180 mg by mouth daily.  Marland Kitchen FLUoxetine (PROZAC) 40 MG capsule TAKE 1 CAPSULE BY MOUTH EVERY DAY  . fluticasone (FLONASE) 50 MCG/ACT nasal spray Place 2 sprays into the nose daily.  Marland Kitchen gabapentin (NEURONTIN) 300 MG capsule TAKE 1 CAPSULE BY MOUTH WITH BREAKFAST AND LUNCH AND TAKE  2 CAPSULES BY MOUTH EVERY NIGHT AT BEDTIME  . hydroxychloroquine (PLAQUENIL) 200 MG tablet Take 200 mg by mouth daily.  Marland Kitchen levothyroxine (SYNTHROID, LEVOTHROID) 100 MCG tablet TAKE 1 TABLET BY MOUTH ONCE DAILY  . lisinopril-hydrochlorothiazide (PRINZIDE,ZESTORETIC) 10-12.5 MG per tablet TAKE 1 TABLET BY MOUTH ONCE DAILY  . Multiple Vitamins-Minerals (SENIOR MULTIVITAMIN PLUS) TABS Take 1 tablet by mouth.    . Omega-3 Fatty Acids (FISH OIL) 1000 MG CAPS Take 1 capsule by mouth daily.  . rosuvastatin (CRESTOR) 20 MG tablet Take 1 tablet (20 mg total) by mouth daily.     Review of Systems  Constitutional:       Painful discolored hands, pale at times  HENT: Negative.   Eyes: Negative.   Gastrointestinal: Negative.   Endocrine: Negative.   Musculoskeletal: Negative.   Skin: Negative.   Allergic/Immunologic: Negative.   Neurological: Negative.   Hematological: Negative.   Psychiatric/Behavioral: Negative.   All other systems reviewed and are negative.    BP 132/80  Pulse 88  Ht 5\' 4"  (1.626 m)  Wt 151 lb 8 oz (68.72 kg)  BMI 25.99 kg/m2  Physical Exam  Nursing note and vitals reviewed. Constitutional: She is oriented to person, place, and time. She appears well-developed and well-nourished.  HENT:  Head: Normocephalic.  Nose: Nose normal.  Mouth/Throat: Oropharynx is clear and moist.  Eyes: Conjunctivae are normal. Pupils are equal, round, and reactive to light.  Neck: Normal range of motion. Neck supple. No JVD present.  Cardiovascular: Normal rate, regular rhythm, S1 normal, S2 normal,  normal heart sounds and intact distal pulses.  Exam reveals no gallop and no friction rub.   No murmur heard. Pulmonary/Chest: Effort normal. No respiratory distress. She has decreased breath sounds. She has no wheezes. She has no rales. She exhibits no tenderness.  Abdominal: Soft. Bowel sounds are normal. She exhibits no distension. There is no tenderness.  Musculoskeletal: Normal range of  motion. She exhibits no edema and no tenderness.  Lymphadenopathy:    She has no cervical adenopathy.  Neurological: She is alert and oriented to person, place, and time. Coordination normal.  Skin: Skin is warm and dry. No rash noted. No erythema.  Psychiatric: She has a normal mood and affect. Her behavior is normal. Judgment and thought content normal.    Assessment and Plan

## 2013-09-29 NOTE — Assessment & Plan Note (Signed)
We have encouraged continued exercise, careful diet management in an effort to lose weight. 

## 2013-09-29 NOTE — Assessment & Plan Note (Signed)
She would like to try a calcium channel blocker for possible Raynaud's. We will hold the lisinopril HCTZ. Start amlodipine 5 mg daily titrating up to 10 mg daily if needed. Suggested she can take diltiazem 30 mg when necessary for Raynaud's episode or for palpitations which she sometimes appreciates at night

## 2013-09-29 NOTE — Patient Instructions (Addendum)
  Stop the lisinopril HCTZ Start amlodipine 1/2 pill daily (5 mg) Monitor your blood pressure If it runs high, increase the amlodipine up to 10 mg daily  If you have a bad episode of the raynauds,  Ok to take diltiazem 30 mg as needed   Please call us if you have new issues that need to be addressed before your next appt.  Your physician wants you to follow-up in: 6 months.  You will receive a reminder letter in the mail two months in advance. If you don't receive a letter, please call our office to schedule the follow-up appointment.

## 2013-10-12 ENCOUNTER — Encounter: Payer: Self-pay | Admitting: Adult Health

## 2013-10-12 ENCOUNTER — Ambulatory Visit (INDEPENDENT_AMBULATORY_CARE_PROVIDER_SITE_OTHER): Payer: Medicare PPO | Admitting: Adult Health

## 2013-10-12 VITALS — BP 128/78 | HR 66 | Temp 98.6°F | Wt 151.2 lb

## 2013-10-12 DIAGNOSIS — J329 Chronic sinusitis, unspecified: Secondary | ICD-10-CM

## 2013-10-12 MED ORDER — AMOXICILLIN-POT CLAVULANATE 875-125 MG PO TABS: 1.0000 | ORAL_TABLET | Freq: Two times a day (BID) | ORAL | Status: DC

## 2013-10-12 NOTE — Progress Notes (Signed)
Pre-visit discussion using our clinic review tool. No additional management support is needed unless otherwise documented below in the visit note.  

## 2013-10-12 NOTE — Progress Notes (Signed)
   Subjective:    Patient ID: [name] Day, female    DOB: 1945/12/21, 68 y.o.   MRN: [id number]  HPI  Pt is a 68 y/o female who presents with URI symptoms and painful lumps under her jaw. Reports having flulike symptoms approximately 7 days ago. She reports having 2 good days following and then began to develop pain on the right side of her face, bloody drainage from her nose, congestion and postnasal drip.  Current Outpatient Prescriptions on File Prior to Visit  Medication Sig Dispense Refill  . ALPRAZolam (XANAX) 0.25 MG tablet Take 1 tablet (0.25 mg total) by mouth 3 (three) times daily as needed.  90 tablet  0  . amLODipine (NORVASC) 10 MG tablet Take 1 tablet (10 mg total) by mouth daily.  30 tablet  6  . CELEBREX 200 MG capsule TAKE ONE CAPSULE BY MOUTH TWICE A DAY  60 capsule  3  . diltiazem (CARDIZEM) 30 MG tablet Take 1 tablet (30 mg total) by mouth 3 (three) times daily as needed.  90 tablet  3  . fexofenadine (ALLEGRA) 180 MG tablet Take 180 mg by mouth daily.      Marland Kitchen FLUoxetine (PROZAC) 40 MG capsule TAKE 1 CAPSULE BY MOUTH EVERY DAY  90 capsule  0  . gabapentin (NEURONTIN) 300 MG capsule TAKE 1 CAPSULE BY MOUTH WITH BREAKFAST AND LUNCH AND TAKE 2 CAPSULES BY MOUTH EVERY NIGHT AT BEDTIME  360 capsule  0  . hydroxychloroquine (PLAQUENIL) 200 MG tablet Take 200 mg by mouth daily.      Marland Kitchen levothyroxine (SYNTHROID, LEVOTHROID) 100 MCG tablet TAKE 1 TABLET BY MOUTH ONCE DAILY  30 tablet  9  . Multiple Vitamins-Minerals (SENIOR MULTIVITAMIN PLUS) TABS Take 1 tablet by mouth.        . Omega-3 Fatty Acids (FISH OIL) 1000 MG CAPS Take 1 capsule by mouth daily.      . rosuvastatin (CRESTOR) 20 MG tablet Take 1 tablet (20 mg total) by mouth daily.  30 tablet  6  . albuterol (PROAIR HFA) 108 (90 BASE) MCG/ACT inhaler Inhale 2 puffs into the lungs every 4 (four) hours as needed for wheezing.  1 Inhaler  2  . fluticasone (FLONASE) 50 MCG/ACT nasal spray Place 2 sprays into the nose daily.   16 g  6   No current facility-administered medications on file prior to visit.     Review of Systems  Constitutional: Positive for fever.  HENT: Positive for congestion, postnasal drip, sinus pressure and voice change. Negative for sore throat.        Right side of her face is painful  All other systems reviewed and are negative.       Objective:   Physical Exam  Constitutional: She is oriented to person, place, and time. She appears well-developed and well-nourished.  Appears uncomfortable  HENT:  Head: Normocephalic and atraumatic.  Bilateral ears with cerumen buildup  Cardiovascular: Normal rate and regular rhythm.   Pulmonary/Chest: Effort normal. No respiratory distress.  Musculoskeletal: Normal range of motion.  Lymphadenopathy:    She has cervical adenopathy.  Neurological: She is alert and oriented to person, place, and time.  Skin: Skin is warm and dry.  Psychiatric: She has a normal mood and affect. Her behavior is normal. Judgment and thought content normal.          Assessment & Plan:

## 2013-10-12 NOTE — Patient Instructions (Signed)
  Start Augmentin one tablet twice a day for 10 days.  Irrigate sinuses with normal saline spray.  May take Tylenol for discomfort.  Return to clinic for followup in one week.  Remember to set up appointment for a nurse visit to irrigate the ears at your earliest convenience.

## 2013-10-19 ENCOUNTER — Ambulatory Visit (INDEPENDENT_AMBULATORY_CARE_PROVIDER_SITE_OTHER): Payer: Medicare HMO | Admitting: Internal Medicine

## 2013-10-19 VITALS — BP 148/90 | HR 68 | Temp 98.0°F | Resp 20 | Ht 64.0 in | Wt 151.1 lb

## 2013-10-19 DIAGNOSIS — E1149 Type 2 diabetes mellitus with other diabetic neurological complication: Secondary | ICD-10-CM

## 2013-10-19 DIAGNOSIS — M064 Inflammatory polyarthropathy: Secondary | ICD-10-CM

## 2013-10-19 DIAGNOSIS — E1142 Type 2 diabetes mellitus with diabetic polyneuropathy: Secondary | ICD-10-CM

## 2013-10-19 DIAGNOSIS — E1140 Type 2 diabetes mellitus with diabetic neuropathy, unspecified: Secondary | ICD-10-CM

## 2013-10-19 DIAGNOSIS — D582 Other hemoglobinopathies: Secondary | ICD-10-CM

## 2013-10-19 DIAGNOSIS — M722 Plantar fascial fibromatosis: Secondary | ICD-10-CM

## 2013-10-19 DIAGNOSIS — R232 Flushing: Secondary | ICD-10-CM

## 2013-10-19 DIAGNOSIS — E119 Type 2 diabetes mellitus without complications: Secondary | ICD-10-CM

## 2013-10-19 DIAGNOSIS — E785 Hyperlipidemia, unspecified: Secondary | ICD-10-CM

## 2013-10-19 DIAGNOSIS — D45 Polycythemia vera: Secondary | ICD-10-CM

## 2013-10-19 DIAGNOSIS — Z79899 Other long term (current) drug therapy: Secondary | ICD-10-CM

## 2013-10-19 DIAGNOSIS — F411 Generalized anxiety disorder: Secondary | ICD-10-CM

## 2013-10-19 DIAGNOSIS — I10 Essential (primary) hypertension: Secondary | ICD-10-CM

## 2013-10-19 DIAGNOSIS — E782 Mixed hyperlipidemia: Secondary | ICD-10-CM

## 2013-10-19 LAB — HM DIABETES FOOT EXAM

## 2013-10-19 MED ORDER — FLUOXETINE HCL 40 MG PO CAPS: ORAL_CAPSULE | ORAL | Status: DC

## 2013-10-19 MED ORDER — ALPRAZOLAM 0.25 MG PO TABS: 0.2500 mg | ORAL_TABLET | Freq: Three times a day (TID) | ORAL | Status: DC | PRN

## 2013-10-19 NOTE — Progress Notes (Signed)
Patient ID: [name] Day, female   DOB: [birth year], 68 y.o.   MRN: [id number]   Patient Active Problem List   Diagnosis Date Noted  . Plantar fasciitis of right foot 10/21/2013  . Former smoker 04/09/2013  . Palpitations 03/25/2013  . Routine general medical examination at a health care facility 08/30/2012  . Inflammatory polyarthritis 05/12/2012  . Diabetes mellitus with neuropathy 02/10/2012  . GERD (gastroesophageal reflux disease)   . Hypertension   . Allergic rhinitis 01/14/2012  . Anxiety state, unspecified 05/14/2011  . Osteopenia 03/01/2011  . Mixed hyperlipidemia 01/31/2011  . Other and unspecified hyperlipidemia 01/31/2011    Subjective:  CC:   Chief Complaint  Patient presents with  . Follow-up    sinusitis  . Foot Pain    Right heel pain x 1 week    HPI:   [name] Dillard Kirchbaumis a 68 y.o. female who presents for Follow up on chronic and acute issues including diabetes mellitus, well controlled, palpitations, hypertension, and hyperlipidemia,  Last seen by me July 2014.  She was treated with for bacterial sinusitis one week ago with empiric antibiotics and her symptoms have improved.  She takes a probiotic  habitually and denies any change in bowel habits,  Specifically denies diarrhea .    She has a new issue of right sided Heel pain which began about 10 days ago.  The pain was noticed at rest and did not follow a long walk or any blunt trauma, but dshe does report having worn a pair on nonsensible shoes" one night foe several hours.  The pain starts at the bottom center of heel and spans the plantar surface of foot. It is aggravated by inactivity and the most painful when she initially bears weight on her feet.  She has purchased OTC orthotics  Which help  Minimally.     Palpitations.  She was evaluated by cardiologist Esmond Plants and was started on amlodipne and cardizem for Renaud's syndrome and palpitations. He also advised her to increase Crestor to 20 mg  daily for goal LDL < 70 given patient's report of atherosclerosis noted on prior imaging of aorta.   Her palpitations have resolved, but now she is having a flushing reaction daily that is very annoying , and attributes it to the amlodipine She also takes a MVI that has niacin in it.   DM: she has not been checking her sugars regularly and has not had an A1C since July,    Past Medical History  Diagnosis Date  . Lichen sclerosus et atrophicus of the vulva 01/2011  . allergic rhinitis   . COPD (chronic obstructive pulmonary disease)     PFTS 2013 FEV1 nearly normalized  . Depression   . Diabetes mellitus   . GERD (gastroesophageal reflux disease)   . Hyperlipidemia   . Hypertension   . Neuropathy     diabetic  . Hypothyroid 2002  . Rheumatic fever     HX  . Raynauds disease     Past Surgical History  Procedure Laterality Date  . Colonoscopy  2004  . Back surgery  2000  . Abdominal hysterectomy  1978  . Spine surgery      L3 to L5       The following portions of the patient's history were reviewed and updated as appropriate: Allergies, current medications, and problem list.    Review of Systems:   12 Pt  review of systems was negative except those addressed in the HPI,  History   Social History  . Marital Status: Widowed    Spouse Name: N/A    Number of Children: N/A  . Years of Education: N/A   Occupational History  . 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 Smoking status: Former Smoker -- 0.50 packs/day for 25 years    Types: Cigarettes    Quit date: 09/17/1993  . Smokeless tobacco: Never Used  . Alcohol Use: No  . Drug Use: No  . Sexual Activity: Not on file   Other Topics Concern  . Not on file   Social History Narrative  . No narrative on file    Objective:  Filed Vitals:   10/19/13 1001  BP: 148/90  Pulse: 68  Temp: 98 F (36.7 C)  Resp: 20     General appearance: alert, cooperative  and appears stated age Ears: normal TM's and external ear canals both ears Throat: lips, mucosa, and tongue normal; teeth and gums normal Neck: no adenopathy, no carotid bruit, supple, symmetrical, trachea midline and thyroid not enlarged, symmetric, no tenderness/mass/nodules Back: symmetric, no curvature. ROM normal. No CVA tenderness. Lungs: clear to auscultation bilaterally Heart: regular rate and rhythm, S1, S2 normal, no murmur, click, rub or gallop Abdomen: soft, non-tender; bowel sounds normal; no masses,  no organomegaly Pulses: 2+ and symmetric Skin: Skin color, texture, turgor normal. No rashes or lesions Lymph nodes: Cervical, supraclavicular, and axillary nodes normal. Decreased sensation to light touch bilaterally   Assessment and Plan:  Diabetes mellitus with neuropathy Well-controlled on current medications.  hemoglobin A1c is  Lab Results  Component Value Date   HGBA1C 6.0 10/20/2013    . She is up-to-date on eye exams and her foot exam was done today.   She is due for microalbumin to creatinine ratio at next visit. She  is on the appropriate medications.  Hyperlipidemia Well controlled on current statin therapy.   Liver enzymes are normal , no changes today.  Lab Results  Component Value Date   CHOL 118 10/20/2013   HDL 45.80 10/20/2013   LDLCALC 49 10/20/2013   LDLDIRECT 117.8 03/25/2013   TRIG 118.0 10/20/2013   CHOLHDL 3 10/20/2013   Lab Results  Component Value Date   ALT 31 10/20/2013   AST 28 10/20/2013   ALKPHOS 70 10/20/2013   BILITOT 0.6 10/20/2013     Plantar fasciitis of right foot By history.  Discussed ways to managed and referral to podiatry,  She has deferred referral at this time.   Inflammatory polyarthritis Managed by [facility] with placquenil and celebrex.  Eyes exams annually by [facility], renal function normal   Hypertension Not Well controlled on current regimen. Renal function stable.  Flushing may be due to amlodipine,   If flushing continues after  stopping the MVI, she will suspend the amlodipine and resume lisinopril once daily. If the flushing stops she will  Increase cardizem use to tid   Flushing reaction Onset around the time amlodipine was started for concurrent control of htn and renaud's.  May also have been taking niacin in MVI.  She will stop the MVI.  If flushing continues, she will suspend the amlodipine and resume lisinopril once daily. If the flushing stops she will  Increase cardizem use to tid i amlodipine is stopped.   Mixed hyperlipidemia Well controlled on current statin therapy.   Liver enzymes are normal , no changes today.  Lab Results  Component Value Date   CHOL 118  10/20/2013   HDL 45.80 10/20/2013   LDLCALC 49 10/20/2013   LDLDIRECT 117.8 03/25/2013   TRIG 118.0 10/20/2013   CHOLHDL 3 10/20/2013   Lab Results  Component Value Date   ALT 31 10/20/2013   AST 28 10/20/2013   ALKPHOS 70 10/20/2013   BILITOT 0.6 10/20/2013    A total of 40 minutes was spent with patient more than half of which was spent in counseling, reviewing records from other prviders and coordination of care.  Updated Medication List Outpatient Encounter Prescriptions as of 10/19/2013  Medication Sig  . ALPRAZolam (XANAX) 0.25 MG tablet Take 1 tablet (0.25 mg total) by mouth 3 (three) times daily as needed.  Marland Kitchen amLODipine (NORVASC) 10 MG tablet Take 1 tablet (10 mg total) by mouth daily.  Marland Kitchen amoxicillin-clavulanate (AUGMENTIN) 875-125 MG per tablet Take 1 tablet by mouth 2 (two) times daily.  . CELEBREX 200 MG capsule TAKE ONE CAPSULE BY MOUTH TWICE A DAY  . diltiazem (CARDIZEM) 30 MG tablet Take 1 tablet (30 mg total) by mouth 3 (three) times daily as needed.  . fexofenadine (ALLEGRA) 180 MG tablet Take 180 mg by mouth daily.  Marland Kitchen FLUoxetine (PROZAC) 40 MG capsule TAKE 1 CAPSULE BY MOUTH EVERY DAY  . gabapentin (NEURONTIN) 300 MG capsule TAKE 1 CAPSULE BY MOUTH WITH BREAKFAST AND LUNCH AND TAKE 2 CAPSULES BY MOUTH EVERY NIGHT AT BEDTIME  .  hydroxychloroquine (PLAQUENIL) 200 MG tablet Take 200 mg by mouth daily.  Marland Kitchen levothyroxine (SYNTHROID, LEVOTHROID) 100 MCG tablet TAKE 1 TABLET BY MOUTH ONCE DAILY  . Multiple Vitamins-Minerals (SENIOR MULTIVITAMIN PLUS) TABS Take 1 tablet by mouth.    . Omega-3 Fatty Acids (FISH OIL) 1000 MG CAPS Take 1 capsule by mouth daily.  . rosuvastatin (CRESTOR) 20 MG tablet Take 1 tablet (20 mg total) by mouth daily.  . [DISCONTINUED] ALPRAZolam (XANAX) 0.25 MG tablet Take 1 tablet (0.25 mg total) by mouth 3 (three) times daily as needed.  . [DISCONTINUED] FLUoxetine (PROZAC) 40 MG capsule TAKE 1 CAPSULE BY MOUTH EVERY DAY  . albuterol (PROAIR HFA) 108 (90 BASE) MCG/ACT inhaler Inhale 2 puffs into the lungs every 4 (four) hours as needed for wheezing.  . fluticasone (FLONASE) 50 MCG/ACT nasal spray Place 2 sprays into the nose daily.

## 2013-10-19 NOTE — Patient Instructions (Addendum)
Plantar Fasciitis Plantar fasciitis is a common condition that causes foot pain. It is soreness (inflammation) of the band of tough fibrous tissue on the bottom of the foot that runs from the heel bone (calcaneus) to the ball of the foot. The cause of this soreness may be from excessive standing, poor fitting shoes, running on hard surfaces, being overweight, having an abnormal walk, or overuse (this is common in runners) of the painful foot or feet. It is also common in aerobic exercise dancers and ballet dancers. SYMPTOMS  Most people with plantar fasciitis complain of:  Severe pain in the morning on the bottom of their foot especially when taking the first steps out of bed. This pain recedes after a few minutes of walking.  Severe pain is experienced also during walking following a long period of inactivity.  Pain is worse when walking barefoot or up stairs DIAGNOSIS   Your caregiver will diagnose this condition by examining and feeling your foot.  Special tests such as X-rays of your foot, are usually not needed. PREVENTION   Consult a sports medicine professional before beginning a new exercise program.  Walking programs offer a good workout. With walking there is a lower chance of overuse injuries common to runners. There is less impact and less jarring of the joints.  Begin all new exercise programs slowly. If problems or pain develop, decrease the amount of time or distance until you are at a comfortable level.  Wear good shoes and replace them regularly.  Stretch your foot and the heel cords at the back of the ankle (Achilles tendon) both before and after exercise.  Run or exercise on even surfaces that are not hard. For example, asphalt is better than pavement.  Do not run barefoot on hard surfaces.  If using a treadmill, vary the incline.  Do not continue to workout if you have foot or joint problems. Seek professional help if they do not improve. HOME CARE INSTRUCTIONS     Avoid activities that cause you pain until you recover.  Use ice or cold packs on the problem or painful areas after working out.  Only take over-the-counter or prescription medicines for pain, discomfort, or fever as directed by your caregiver.  Soft shoe inserts or athletic shoes with air or gel sole cushions may be helpful.  If problems continue or become more severe, consult a sports medicine caregiver or your own health care provider. Cortisone is a potent anti-inflammatory medication that may be injected into the painful area. You can discuss this treatment with your caregiver. MAKE SURE YOU:   Understand these instructions.  Will watch your condition.  Will get help right away if you are not doing well or get worse. Document Released: 05/29/2001 Document Revised: 11/26/2011 Document Reviewed: 07/28/2008 [facility] Patient Information 2014 [place], [place].  FLUSHING REACTION    The flushing may be from niacin in your MVI.  Try a different MVI if this has niacin  If not,  Resume prior lisinopril and stop amlodipine to see if the flushing stops.  If it does, but if th palpiations or renaud's recurs,  Increase the cardizem toup to  three times daily   FOLLOW UP ON LIPIDS,  DIABETES  Please return for fasting labs ASAP

## 2013-10-19 NOTE — Progress Notes (Signed)
Pre-visit discussion using our clinic review tool. No additional management support is needed unless otherwise documented below in the visit note.  

## 2013-10-20 ENCOUNTER — Other Ambulatory Visit (INDEPENDENT_AMBULATORY_CARE_PROVIDER_SITE_OTHER): Payer: Medicare HMO

## 2013-10-20 DIAGNOSIS — E119 Type 2 diabetes mellitus without complications: Secondary | ICD-10-CM

## 2013-10-20 DIAGNOSIS — Z79899 Other long term (current) drug therapy: Secondary | ICD-10-CM

## 2013-10-20 DIAGNOSIS — E785 Hyperlipidemia, unspecified: Secondary | ICD-10-CM

## 2013-10-20 DIAGNOSIS — D582 Other hemoglobinopathies: Secondary | ICD-10-CM

## 2013-10-20 LAB — COMPREHENSIVE METABOLIC PANEL
ALBUMIN: 4.1 g/dL (ref 3.5–5.2)
ALT: 31 U/L (ref 0–35)
AST: 28 U/L (ref 0–37)
Alkaline Phosphatase: 70 U/L (ref 39–117)
BUN: 15 mg/dL (ref 6–23)
CALCIUM: 9.5 mg/dL (ref 8.4–10.5)
CHLORIDE: 104 meq/L (ref 96–112)
CO2: 26 mEq/L (ref 19–32)
CREATININE: 0.7 mg/dL (ref 0.4–1.2)
GFR: 95 mL/min (ref >60.00)
GLUCOSE: 94 mg/dL (ref 70–99)
Potassium: 4.1 mEq/L (ref 3.5–5.1)
Sodium: 139 mEq/L (ref 135–145)
Total Bilirubin: 0.6 mg/dL (ref 0.3–1.2)
Total Protein: 7 g/dL (ref 6.0–8.3)

## 2013-10-20 LAB — LIPID PANEL
CHOL/HDL RATIO: 3
CHOLESTEROL: 118 mg/dL (ref 0–200)
HDL: 45.8 mg/dL (ref >39.00)
LDL Cholesterol: 49 mg/dL (ref 0–99)
TRIGLYCERIDES: 118 mg/dL (ref 0.0–149.0)
VLDL: 23.6 mg/dL (ref 0.0–40.0)

## 2013-10-20 LAB — CBC WITH DIFFERENTIAL/PLATELET
BASOS PCT: 1.1 % (ref 0.0–3.0)
Basophils Absolute: 0.1 10*3/uL (ref 0.0–0.1)
EOS PCT: 2.8 % (ref 0.0–5.0)
Eosinophils Absolute: 0.2 10*3/uL (ref 0.0–0.7)
HCT: 45.8 % (ref 36.0–46.0)
HEMOGLOBIN: 15.3 g/dL — AB (ref 12.0–15.0)
LYMPHS PCT: 24.3 % (ref 12.0–46.0)
Lymphs Abs: 1.7 10*3/uL (ref 0.7–4.0)
MCHC: 33.4 g/dL (ref 30.0–36.0)
MCV: 88.1 fl (ref 78.0–100.0)
MONO ABS: 0.6 10*3/uL (ref 0.1–1.0)
Monocytes Relative: 8.4 % (ref 3.0–12.0)
Neutro Abs: 4.4 10*3/uL (ref 1.4–7.7)
Neutrophils Relative %: 63.4 % (ref 43.0–77.0)
Platelets: 243 10*3/uL (ref 150.0–400.0)
RBC: 5.2 Mil/uL — AB (ref 3.87–5.11)
RDW: 13.1 % (ref 11.5–14.6)
WBC: 6.9 10*3/uL (ref 4.5–10.5)

## 2013-10-20 LAB — HEMOGLOBIN A1C: Hgb A1c MFr Bld: 6 % (ref 4.6–6.5)

## 2013-10-21 ENCOUNTER — Encounter: Payer: Self-pay | Admitting: Internal Medicine

## 2013-10-21 DIAGNOSIS — R232 Flushing: Secondary | ICD-10-CM | POA: Insufficient documentation

## 2013-10-21 DIAGNOSIS — M722 Plantar fascial fibromatosis: Secondary | ICD-10-CM | POA: Insufficient documentation

## 2013-10-21 NOTE — Assessment & Plan Note (Signed)
Well controlled on current statin therapy.   Liver enzymes are normal , no changes today.  Lab Results  Component Value Date   CHOL 118 10/20/2013   HDL 45.80 10/20/2013   LDLCALC 49 10/20/2013   LDLDIRECT 117.8 03/25/2013   TRIG 118.0 10/20/2013   CHOLHDL 3 10/20/2013   Lab Results  Component Value Date   ALT 31 10/20/2013   AST 28 10/20/2013   ALKPHOS 70 10/20/2013   BILITOT 0.6 10/20/2013   

## 2013-10-21 NOTE — Assessment & Plan Note (Addendum)
Not Well controlled on current regimen. Renal function stable.  Flushing may be due to amlodipine,   If flushing continues after stopping the MVI, she will suspend the amlodipine and resume lisinopril once daily. If the flushing stops she will  Increase cardizem use to tid

## 2013-10-21 NOTE — Assessment & Plan Note (Signed)
By history.  Discussed ways to managed and referral to podiatry,  She has deferred referral at this time.

## 2013-10-21 NOTE — Assessment & Plan Note (Signed)
Onset around the time amlodipine was started for concurrent control of htn and renaud's.  May also have been taking niacin in MVI.  She will stop the MVI.  If flushing continues, she will suspend the amlodipine and resume lisinopril once daily. If the flushing stops she will  Increase cardizem use to tid i amlodipine is stopped.

## 2013-10-21 NOTE — Assessment & Plan Note (Signed)
Well controlled on current statin therapy.   Liver enzymes are normal , no changes today.  Lab Results  Component Value Date   CHOL 118 10/20/2013   HDL 45.80 10/20/2013   LDLCALC 49 10/20/2013   LDLDIRECT 117.8 03/25/2013   TRIG 118.0 10/20/2013   CHOLHDL 3 10/20/2013   Lab Results  Component Value Date   ALT 31 10/20/2013   AST 28 10/20/2013   ALKPHOS 70 10/20/2013   BILITOT 0.6 10/20/2013

## 2013-10-21 NOTE — Assessment & Plan Note (Signed)
Managed by [facility] with placquenil and celebrex.  Eyes exams annually by [facility], renal function normal

## 2013-10-21 NOTE — Assessment & Plan Note (Addendum)
Well-controlled on current medications.  hemoglobin A1c is  Lab Results  Component Value Date   HGBA1C 6.0 10/20/2013    . She is up-to-date on eye exams and her foot exam was done today.   She is due for microalbumin to creatinine ratio at next visit. She  is on the appropriate medications.

## 2013-10-22 ENCOUNTER — Other Ambulatory Visit: Payer: Self-pay | Admitting: Internal Medicine

## 2013-10-22 NOTE — Telephone Encounter (Signed)
Stopped per Dr. Donivan Scull 09/29/13 note, refill?

## 2013-10-23 ENCOUNTER — Telehealth: Payer: Self-pay

## 2013-10-23 NOTE — Telephone Encounter (Signed)
Mailed unread message to pt  

## 2013-10-23 NOTE — Telephone Encounter (Signed)
Relevant patient education assigned to patient using Emmi. ° °

## 2013-10-23 NOTE — Telephone Encounter (Signed)
Ok to resume per discussion with me at last visit

## 2013-10-29 NOTE — Addendum Note (Signed)
Addended by: Crecencio Mc on: 10/29/2013 12:32 PM   Modules accepted: Orders, Medications

## 2013-11-02 ENCOUNTER — Telehealth: Payer: Self-pay | Admitting: Emergency Medicine

## 2013-11-02 NOTE — Telephone Encounter (Signed)
Referral for [facility] for [facility]

## 2013-11-05 NOTE — Telephone Encounter (Signed)
Per [facility] pt does not need referral b/c [facility] is [facility]

## 2013-12-14 ENCOUNTER — Other Ambulatory Visit: Payer: Self-pay | Admitting: Internal Medicine

## 2013-12-22 ENCOUNTER — Ambulatory Visit (INDEPENDENT_AMBULATORY_CARE_PROVIDER_SITE_OTHER): Payer: Medicare HMO | Admitting: Adult Health

## 2013-12-22 ENCOUNTER — Encounter: Payer: Self-pay | Admitting: Adult Health

## 2013-12-22 ENCOUNTER — Encounter: Payer: Self-pay | Admitting: Internal Medicine

## 2013-12-22 VITALS — BP 108/62 | HR 73 | Temp 98.3°F | Resp 14 | Wt 151.0 lb

## 2013-12-22 DIAGNOSIS — H6192 Disorder of left external ear, unspecified: Secondary | ICD-10-CM

## 2013-12-22 DIAGNOSIS — H6190 Disorder of external ear, unspecified, unspecified ear: Secondary | ICD-10-CM

## 2013-12-22 DIAGNOSIS — I10 Essential (primary) hypertension: Secondary | ICD-10-CM

## 2013-12-22 LAB — BASIC METABOLIC PANEL
BUN: 21 mg/dL (ref 6–23)
CALCIUM: 9.8 mg/dL (ref 8.4–10.5)
CHLORIDE: 102 meq/L (ref 96–112)
CO2: 28 mEq/L (ref 19–32)
Creatinine, Ser: 0.7 mg/dL (ref 0.4–1.2)
GFR: 93.31 mL/min (ref >60.00)
GLUCOSE: 92 mg/dL (ref 70–99)
POTASSIUM: 4 meq/L (ref 3.5–5.1)
SODIUM: 138 meq/L (ref 135–145)

## 2013-12-22 NOTE — Patient Instructions (Addendum)
  Your blood pressure is very good.  Continue your meds as prescribed.  I am referring you to dermatology. Our office will contact you with the appointment.   Please schedule follow up appointment with Dr. Derrel Nip for May or June.

## 2013-12-22 NOTE — Progress Notes (Signed)
Patient ID: [name] Day, female   DOB: 1945/10/23, 68 y.o.   MRN: [id number]    Subjective:    Patient ID: [name] Day, female    DOB: 06-28-1946, 68 y.o.   MRN: [id number]  HPI  Pt is a pleasant 68 y/o female who presents to clinic for blood pressure follow up. She was recently seen by cardiology with changes to her b/p meds. She did not tolerate the amlodipine. She saw her PCP, Dr. Derrel Nip, in February and was taken off the amlodipine and restarted on lisinopril/hctz. Pt is doing well. No SE from medication. No HA, lightheadedness. She is doing well. Blood pressure well controlled.  She reports lesion on external left ear which does not heal. She reports that the lesion is painful.  Past Medical History  Diagnosis Date  . Lichen sclerosus et atrophicus of the vulva 01/2011  . allergic rhinitis   . COPD (chronic obstructive pulmonary disease)     PFTS 2013 FEV1 nearly normalized  . Depression   . Diabetes mellitus   . GERD (gastroesophageal reflux disease)   . Hyperlipidemia   . Hypertension   . Neuropathy     diabetic  . Hypothyroid 2002  . Rheumatic fever     HX  . Raynauds disease     Current Outpatient Prescriptions on File Prior to Visit  Medication Sig Dispense Refill  . ALPRAZolam (XANAX) 0.25 MG tablet Take 1 tablet (0.25 mg total) by mouth 3 (three) times daily as needed.  90 tablet  0  . CELEBREX 200 MG capsule TAKE ONE CAPSULE BY MOUTH TWICE A DAY  60 capsule  3  . diltiazem (CARDIZEM) 30 MG tablet Take 1 tablet (30 mg total) by mouth 3 (three) times daily as needed.  90 tablet  3  . fexofenadine (ALLEGRA) 180 MG tablet Take 180 mg by mouth daily.      Marland Kitchen FLUoxetine (PROZAC) 40 MG capsule TAKE 1 CAPSULE BY MOUTH EVERY DAY  90 capsule  2  . gabapentin (NEURONTIN) 300 MG capsule TAKE ONE CAPSULE BY MOUTH WITH BREAKFAST AND LUNCH AND 2 CAPSULES EVERY NIGHT AT BEDTIME  360 capsule  0  . hydroxychloroquine (PLAQUENIL) 200 MG tablet Take 200 mg by mouth daily.        Marland Kitchen levothyroxine (SYNTHROID, LEVOTHROID) 100 MCG tablet TAKE 1 TABLET BY MOUTH ONCE DAILY  30 tablet  9  . lisinopril-hydrochlorothiazide (PRINZIDE,ZESTORETIC) 10-12.5 MG per tablet TAKE 1 TABLET BY MOUTH EVERY DAY  30 tablet  0  . Multiple Vitamins-Minerals (SENIOR MULTIVITAMIN PLUS) TABS Take 1 tablet by mouth.        . Omega-3 Fatty Acids (FISH OIL) 1000 MG CAPS Take 1 capsule by mouth daily.      . rosuvastatin (CRESTOR) 20 MG tablet Take 1 tablet (20 mg total) by mouth daily.  30 tablet  6  . albuterol (PROAIR HFA) 108 (90 BASE) MCG/ACT inhaler Inhale 2 puffs into the lungs every 4 (four) hours as needed for wheezing.  1 Inhaler  2  . fluticasone (FLONASE) 50 MCG/ACT nasal spray Place 2 sprays into the nose daily.  16 g  6   No current facility-administered medications on file prior to visit.     Review of Systems  Constitutional: Negative.   Respiratory: Negative.   Cardiovascular: Negative.   Skin: Negative for rash. Wound: lesion.       Lesion on external ear on left  Neurological: Negative for dizziness, syncope, light-headedness and headaches.  All  other systems reviewed and are negative.       Objective:  BP 108/62  Pulse 73  Temp(Src) 98.3 F (36.8 C) (Oral)  Resp 14  Wt 151 lb (68.493 kg)  SpO2 95%   Physical Exam  Constitutional: She is oriented to person, place, and time. She appears well-developed and well-nourished. No distress.  HENT:  Head: Normocephalic and atraumatic.  Left external ear lesion  Eyes: Conjunctivae and EOM are normal.  Neck: Normal range of motion. Neck supple.  Cardiovascular: Normal rate, regular rhythm, normal heart sounds and intact distal pulses.  Exam reveals no gallop and no friction rub.   No murmur heard. Pulmonary/Chest: Effort normal and breath sounds normal. No respiratory distress. She has no wheezes. She has no rales.  Musculoskeletal: Normal range of motion.  Neurological: She is alert and oriented to person, place, and  time. She has normal reflexes. Coordination normal.  Skin: Skin is warm and dry.  Psychiatric: She has a normal mood and affect. Her behavior is normal. Judgment and thought content normal.      Assessment & Plan:   1. HTN (hypertension) Blood pressure well controlled on lisinopril. Check labs. Continue to follow - Basic metabolic panel  2. Lesion of left external ear Refer to dermatology for evaluation of lesion of ear.  - Ambulatory referral to Dermatology

## 2013-12-22 NOTE — Progress Notes (Signed)
Pre visit review using our clinic review tool, if applicable. No additional management support is needed unless otherwise documented below in the visit note. 

## 2013-12-23 ENCOUNTER — Encounter: Payer: Self-pay | Admitting: Emergency Medicine

## 2014-01-05 ENCOUNTER — Other Ambulatory Visit: Payer: Self-pay | Admitting: Internal Medicine

## 2014-03-08 ENCOUNTER — Ambulatory Visit (INDEPENDENT_AMBULATORY_CARE_PROVIDER_SITE_OTHER): Payer: Medicare HMO | Admitting: Internal Medicine

## 2014-03-08 ENCOUNTER — Encounter: Payer: Self-pay | Admitting: Internal Medicine

## 2014-03-08 VITALS — BP 138/76 | HR 75 | Temp 97.7°F | Resp 16 | Ht 64.0 in | Wt 147.8 lb

## 2014-03-08 DIAGNOSIS — E1142 Type 2 diabetes mellitus with diabetic polyneuropathy: Secondary | ICD-10-CM

## 2014-03-08 DIAGNOSIS — D751 Secondary polycythemia: Secondary | ICD-10-CM

## 2014-03-08 DIAGNOSIS — R232 Flushing: Secondary | ICD-10-CM

## 2014-03-08 DIAGNOSIS — E1049 Type 1 diabetes mellitus with other diabetic neurological complication: Secondary | ICD-10-CM

## 2014-03-08 DIAGNOSIS — E1040 Type 1 diabetes mellitus with diabetic neuropathy, unspecified: Secondary | ICD-10-CM

## 2014-03-08 DIAGNOSIS — Z1239 Encounter for other screening for malignant neoplasm of breast: Secondary | ICD-10-CM

## 2014-03-08 DIAGNOSIS — E119 Type 2 diabetes mellitus without complications: Secondary | ICD-10-CM

## 2014-03-08 DIAGNOSIS — E782 Mixed hyperlipidemia: Secondary | ICD-10-CM

## 2014-03-08 DIAGNOSIS — I10 Essential (primary) hypertension: Secondary | ICD-10-CM

## 2014-03-08 LAB — CBC WITH DIFFERENTIAL/PLATELET
BASOS PCT: 0.6 % (ref 0.0–3.0)
Basophils Absolute: 0 10*3/uL (ref 0.0–0.1)
Eosinophils Absolute: 0.2 10*3/uL (ref 0.0–0.7)
Eosinophils Relative: 1.9 % (ref 0.0–5.0)
HCT: 47.8 % — ABNORMAL HIGH (ref 36.0–46.0)
Hemoglobin: 16.4 g/dL — ABNORMAL HIGH (ref 12.0–15.0)
LYMPHS PCT: 20.7 % (ref 12.0–46.0)
Lymphs Abs: 1.7 10*3/uL (ref 0.7–4.0)
MCHC: 34.3 g/dL (ref 30.0–36.0)
MCV: 86.7 fl (ref 78.0–100.0)
Monocytes Absolute: 0.8 10*3/uL (ref 0.1–1.0)
Monocytes Relative: 9.3 % (ref 3.0–12.0)
Neutro Abs: 5.7 10*3/uL (ref 1.4–7.7)
Neutrophils Relative %: 67.5 % (ref 43.0–77.0)
PLATELETS: 276 10*3/uL (ref 150.0–400.0)
RBC: 5.52 Mil/uL — AB (ref 3.87–5.11)
RDW: 13.4 % (ref 11.5–15.5)
WBC: 8.4 10*3/uL (ref 4.0–10.5)

## 2014-03-08 LAB — COMPREHENSIVE METABOLIC PANEL
ALK PHOS: 63 U/L (ref 39–117)
ALT: 27 U/L (ref 0–35)
AST: 26 U/L (ref 0–37)
Albumin: 4.8 g/dL (ref 3.5–5.2)
BUN: 27 mg/dL — AB (ref 6–23)
CO2: 30 mEq/L (ref 19–32)
Calcium: 10.2 mg/dL (ref 8.4–10.5)
Chloride: 102 mEq/L (ref 96–112)
Creatinine, Ser: 0.9 mg/dL (ref 0.4–1.2)
GFR: 66.34 mL/min (ref >60.00)
GLUCOSE: 96 mg/dL (ref 70–99)
Potassium: 4.3 mEq/L (ref 3.5–5.1)
SODIUM: 139 meq/L (ref 135–145)
TOTAL PROTEIN: 7.6 g/dL (ref 6.0–8.3)
Total Bilirubin: 0.8 mg/dL (ref 0.2–1.2)

## 2014-03-08 LAB — MICROALBUMIN / CREATININE URINE RATIO
CREATININE, U: 127.6 mg/dL
Microalb Creat Ratio: 0.3 mg/g (ref 0.0–30.0)
Microalb, Ur: 0.4 mg/dL (ref 0.0–1.9)

## 2014-03-08 LAB — LIPID PANEL
CHOLESTEROL: 172 mg/dL (ref 0–200)
HDL: 48 mg/dL (ref >39.00)
LDL Cholesterol: 81 mg/dL (ref 0–99)
NonHDL: 124
TRIGLYCERIDES: 213 mg/dL — AB (ref 0.0–149.0)
Total CHOL/HDL Ratio: 4
VLDL: 42.6 mg/dL — AB (ref 0.0–40.0)

## 2014-03-08 LAB — HEMOGLOBIN A1C: Hgb A1c MFr Bld: 5.9 % (ref 4.6–6.5)

## 2014-03-08 MED ORDER — GABAPENTIN 300 MG PO CAPS: ORAL_CAPSULE | ORAL | Status: DC

## 2014-03-08 NOTE — Patient Instructions (Addendum)
You are doing well!  Thank you for fasting today  Referral to Dr Vickki Muff to take care of that bleeding callous on your left foot   Remember:  Fever (100.4 or higher) and a headache may be the first sings of [facility] Spotted fever so call!!  I'll see you in 3 months   Your mammogram is overdue.  I have order ed it.

## 2014-03-08 NOTE — Progress Notes (Signed)
Patient ID: [name] Day, female   DOB: 05-20-1946, 68 y.o.   MRN: [id number]   Patient Active Problem List   Diagnosis Date Noted  . Plantar fasciitis of right foot 10/21/2013  . Flushing reaction 10/21/2013  . Former smoker 04/09/2013  . Palpitations 03/25/2013  . Routine general medical examination at a health care facility 08/30/2012  . Inflammatory polyarthritis 05/12/2012  . Diabetes mellitus with neuropathy 02/10/2012  . GERD (gastroesophageal reflux disease)   . Hypertension   . Allergic rhinitis 01/14/2012  . Anxiety state, unspecified 05/14/2011  . Osteopenia 03/01/2011  . Mixed hyperlipidemia 01/31/2011    Subjective:  CC:   Chief Complaint  Patient presents with  . Follow-up  . Diabetes    HPI:   [name] Day is a 68 y.o. female who presents for  Follow up on DM with neuropathy, diet controlled, polyarthritis, hypothyroidism. Exercising regularly and following a low Gi diet.    She feels generally well but continues to have flushing and itching after showers/bathing which has ben occurring for years with no prior workup .  No headaches, dizzy spells or TIAs.   Joint pain from arthritis is managed by Dr Jefm Bryant with plaquenil and celebrex.    Discussed  Colonoscopy which is  Overdue for follow up on polyps,  She is intentionally delaying  Until  her sister Seward Grater since she has no one else to drive her to the appt .    Past Medical History  Diagnosis Date  . Lichen sclerosus et atrophicus of the vulva 01/2011  . allergic rhinitis   . COPD (chronic obstructive pulmonary disease)     PFTS 2013 FEV1 nearly normalized  . Depression   . Diabetes mellitus   . GERD (gastroesophageal reflux disease)   . Hyperlipidemia   . Hypertension   . Neuropathy     diabetic  . Hypothyroid 2002  . Rheumatic fever     HX  . Raynauds disease     Past Surgical History  Procedure Laterality Date  . Colonoscopy  2004  . Back surgery  2000  . Abdominal  hysterectomy  1978  . Spine surgery      L3 to L5       The following portions of the patient's history were reviewed and updated as appropriate: Allergies, current medications, and problem list.    Review of Systems:   Patient denies headache, fevers, malaise, unintentional weight loss, skin rash, eye pain, sinus congestion and sinus pain, sore throat, dysphagia,  hemoptysis , cough, dyspnea, wheezing, chest pain, palpitations, orthopnea, edema, abdominal pain, nausea, melena, diarrhea, constipation, flank pain, dysuria, hematuria, urinary  Frequency, nocturia, numbness, tingling, seizures,  Focal weakness, Loss of consciousness,  Tremor, insomnia, depression, anxiety, and suicidal ideation.     History   Social History  . Marital Status: Widowed    Spouse Name: N/A    Number of Children: N/A  . Years of Education: N/A   Occupational History  . 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 Smoking status: Former Smoker -- 0.50 packs/day for 25 years    Types: Cigarettes    Quit date: 09/17/1993  . Smokeless tobacco: Never Used  . Alcohol Use: No  . Drug Use: No  . Sexual Activity: Not Currently   Other Topics Concern  . Not on file   Social History Narrative  . No narrative on file    Objective:  Filed Vitals:  03/08/14 0952  BP: 138/76  Pulse: 75  Temp: 97.7 F (36.5 C)  Resp: 16     General appearance: alert, cooperative and appears stated age Ears: normal TM's and external ear canals both ears Throat: lips, mucosa, and tongue normal; teeth and gums normal Neck: no adenopathy, no carotid bruit, supple, symmetrical, trachea midline and thyroid not enlarged, symmetric, no tenderness/mass/nodules Back: symmetric, no curvature. ROM normal. No CVA tenderness. Lungs: clear to auscultation bilaterally Heart: regular rate and rhythm, S1, S2 normal, no murmur, click, rub or gallop Abdomen: soft, non-tender; bowel sounds  normal; no masses,  no organomegaly Pulses: 2+ and symmetric Skin: Skin color, texture, turgor normal. No rashes or lesions Lymph nodes: Cervical, supraclavicular, and axillary nodes normal.  Assessment and Plan:  Hypertension Well controlled on current regimen. Renal function stable, no changes today.  Lab Results  Component Value Date   CREATININE 0.9 03/08/2014   Lab Results  Component Value Date   NA 139 03/08/2014   K 4.3 03/08/2014   CL 102 03/08/2014   CO2 30 03/08/2014    Flushing reaction Onset around the time amlodipine was started for concurrent control of htn and renaud's.  Has persisted despite stopping niacin, amlodipine and resuming  lisinopril and diltiazem,  Given her elevation in hgb, symptoms may be due to PCV,  Referral to hematology advised    Type II or unspecified type diabetes mellitus without mention of complication, not stated as uncontrolled Well-controlled on current medications.  hemoglobin A1c is  Lab Results  Component Value Date   HGBA1C 5.9 03/08/2014   Lab Results  Component Value Date   MICROALBUR 0.4 03/08/2014     . She is up-to-date on eye exams and her foot exam was done today.   She has no proteinuria  .  She is on the appropriate medications.    Mixed hyperlipidemia Well controlled on current statin therapy.   Liver enzymes are normal , no changes today.  Lab Results  Component Value Date   CHOL 172 03/08/2014   HDL 48.00 03/08/2014   LDLCALC 81 03/08/2014   LDLDIRECT 117.8 03/25/2013   TRIG 213.0* 03/08/2014   CHOLHDL 4 03/08/2014   Lab Results  Component Value Date   ALT 27 03/08/2014   AST 26 03/08/2014   ALKPHOS 63 03/08/2014   BILITOT 0.8 03/08/2014       Updated Medication List Outpatient Encounter Prescriptions as of 03/08/2014  Medication Sig  . albuterol (PROAIR HFA) 108 (90 BASE) MCG/ACT inhaler Inhale 2 puffs into the lungs every 4 (four) hours as needed for wheezing.  Marland Kitchen ALPRAZolam (XANAX) 0.25 MG tablet Take 1  tablet (0.25 mg total) by mouth 3 (three) times daily as needed.  . CELEBREX 200 MG capsule TAKE ONE CAPSULE BY MOUTH TWICE A DAY  . diltiazem (CARDIZEM) 30 MG tablet Take 1 tablet (30 mg total) by mouth 3 (three) times daily as needed.  . fexofenadine (ALLEGRA) 180 MG tablet Take 180 mg by mouth daily.  Marland Kitchen FLUoxetine (PROZAC) 40 MG capsule TAKE 1 CAPSULE BY MOUTH EVERY DAY  . fluticasone (FLONASE) 50 MCG/ACT nasal spray Place 2 sprays into the nose daily.  Marland Kitchen gabapentin (NEURONTIN) 300 MG capsule TAKE ONE CAPSULE BY MOUTH WITH BREAKFAST AND LUNCH AND 2 CAPSULES EVERY NIGHT AT BEDTIME  . hydroxychloroquine (PLAQUENIL) 200 MG tablet Take 200 mg by mouth daily.  Marland Kitchen levothyroxine (SYNTHROID, LEVOTHROID) 100 MCG tablet TAKE 1 TABLET BY MOUTH ONCE DAILY  . lisinopril-hydrochlorothiazide (PRINZIDE,ZESTORETIC) 10-12.5  MG per tablet TAKE 1 TABLET BY MOUTH EVERY DAY  . Multiple Vitamins-Minerals (SENIOR MULTIVITAMIN PLUS) TABS Take 1 tablet by mouth.    . Omega-3 Fatty Acids (FISH OIL) 1000 MG CAPS Take 1 capsule by mouth daily.  . rosuvastatin (CRESTOR) 20 MG tablet Take 1 tablet (20 mg total) by mouth daily.  . [DISCONTINUED] gabapentin (NEURONTIN) 300 MG capsule TAKE ONE CAPSULE BY MOUTH WITH BREAKFAST AND LUNCH AND 2 CAPSULES EVERY NIGHT AT BEDTIME     Orders Placed This Encounter  Procedures  . MM DIGITAL SCREENING BILATERAL  . Hemoglobin A1c  . Microalbumin / creatinine urine ratio  . Comprehensive metabolic panel  . Lipid panel  . CBC with Differential    Return in about 3 months (around 06/08/2014) for follow up diabetes.

## 2014-03-08 NOTE — Progress Notes (Signed)
Pre-visit discussion using our clinic review tool. No additional management support is needed unless otherwise documented below in the visit note.  

## 2014-03-09 ENCOUNTER — Encounter: Payer: Self-pay | Admitting: Internal Medicine

## 2014-03-09 LAB — HM DIABETES FOOT EXAM

## 2014-03-09 NOTE — Assessment & Plan Note (Signed)
Onset around the time amlodipine was started for concurrent control of htn and renaud's.  Has persisted despite stopping niacin, amlodipine and resuming  lisinopril and diltiazem,  Given her elevation in hgb, symptoms may be due to PCV,  Referral to hematology advised

## 2014-03-09 NOTE — Assessment & Plan Note (Signed)
Well controlled on current regimen. Renal function stable, no changes today.  Lab Results  Component Value Date   CREATININE 0.9 03/08/2014   Lab Results  Component Value Date   NA 139 03/08/2014   K 4.3 03/08/2014   CL 102 03/08/2014   CO2 30 03/08/2014

## 2014-03-09 NOTE — Assessment & Plan Note (Signed)
Well controlled on current statin therapy.   Liver enzymes are normal , no changes today.  Lab Results  Component Value Date   CHOL 172 03/08/2014   HDL 48.00 03/08/2014   LDLCALC 81 03/08/2014   LDLDIRECT 117.8 03/25/2013   TRIG 213.0* 03/08/2014   CHOLHDL 4 03/08/2014   Lab Results  Component Value Date   ALT 27 03/08/2014   AST 26 03/08/2014   ALKPHOS 63 03/08/2014   BILITOT 0.8 03/08/2014

## 2014-03-09 NOTE — Assessment & Plan Note (Signed)
Well-controlled on current medications.  hemoglobin A1c is  Lab Results  Component Value Date   HGBA1C 5.9 03/08/2014   Lab Results  Component Value Date   MICROALBUR 0.4 03/08/2014     . She is up-to-date on eye exams and her foot exam was done today.   She has no proteinuria  .  She is on the appropriate medications.

## 2014-03-11 ENCOUNTER — Encounter: Payer: Self-pay | Admitting: Internal Medicine

## 2014-03-11 DIAGNOSIS — E1149 Type 2 diabetes mellitus with other diabetic neurological complication: Secondary | ICD-10-CM

## 2014-03-11 NOTE — Telephone Encounter (Signed)
Looks like you mentioned referral to Dr Vickki Muff in your note, but referral not placed yet

## 2014-04-08 ENCOUNTER — Encounter: Payer: Self-pay | Admitting: Adult Health

## 2014-04-08 ENCOUNTER — Ambulatory Visit (INDEPENDENT_AMBULATORY_CARE_PROVIDER_SITE_OTHER): Payer: Medicare HMO | Admitting: Adult Health

## 2014-04-08 VITALS — BP 129/82 | HR 77 | Temp 98.3°F | Resp 14 | Ht 64.0 in | Wt 150.5 lb

## 2014-04-08 DIAGNOSIS — T63461A Toxic effect of venom of wasps, accidental (unintentional), initial encounter: Secondary | ICD-10-CM

## 2014-04-08 DIAGNOSIS — T6391XA Toxic effect of contact with unspecified venomous animal, accidental (unintentional), initial encounter: Secondary | ICD-10-CM

## 2014-04-08 MED ORDER — CEPHALEXIN 500 MG PO CAPS: 500.0000 mg | ORAL_CAPSULE | Freq: Two times a day (BID) | ORAL | Status: DC

## 2014-04-08 NOTE — Patient Instructions (Signed)
  Start Keflex 500 mg twice a day for 7 days.  You can apply cool compresses to the area to decrease inflammation.  Tylenol or ibuprofen for discomfort.  Return for follow up in approximately 1 week.

## 2014-04-09 NOTE — Progress Notes (Signed)
Patient ID: ADALEY KIENE, female   DOB: 1946/02/28, 68 y.o.   MRN: [id number]   Subjective:    Patient ID: PORSHA SKILTON, female    DOB: 1946-02-22, 68 y.o.   MRN: [id number]  HPI  Pleasant 68 y/o female who presents to clinic following being stung in multiple areas by wasps. She was moving something in her yard and did not notice that there was a wasp nest and dropped the nest. She was bit on her finger, leg and right breast. The right breast is red, swollen which is the main reason she came in.  Past Medical History  Diagnosis Date  . Lichen sclerosus et atrophicus of the vulva 01/2011  . allergic rhinitis   . COPD (chronic obstructive pulmonary disease)     PFTS 2013 FEV1 nearly normalized  . Depression   . Diabetes mellitus   . GERD (gastroesophageal reflux disease)   . Hyperlipidemia   . Hypertension   . Neuropathy     diabetic  . Hypothyroid 2002  . Rheumatic fever     HX  . Raynauds disease     Current Outpatient Prescriptions on File Prior to Visit  Medication Sig Dispense Refill  . ALPRAZolam (XANAX) 0.25 MG tablet Take 1 tablet (0.25 mg total) by mouth 3 (three) times daily as needed.  90 tablet  0  . CELEBREX 200 MG capsule TAKE ONE CAPSULE BY MOUTH TWICE A DAY  60 capsule  3  . diltiazem (CARDIZEM) 30 MG tablet Take 1 tablet (30 mg total) by mouth 3 (three) times daily as needed.  90 tablet  3  . fexofenadine (ALLEGRA) 180 MG tablet Take 180 mg by mouth daily.      Marland Kitchen FLUoxetine (PROZAC) 40 MG capsule TAKE 1 CAPSULE BY MOUTH EVERY DAY  90 capsule  2  . gabapentin (NEURONTIN) 300 MG capsule TAKE ONE CAPSULE BY MOUTH WITH BREAKFAST AND LUNCH AND 2 CAPSULES EVERY NIGHT AT BEDTIME  360 capsule  3  . hydroxychloroquine (PLAQUENIL) 200 MG tablet Take 200 mg by mouth daily.      Marland Kitchen levothyroxine (SYNTHROID, LEVOTHROID) 100 MCG tablet TAKE 1 TABLET BY MOUTH ONCE DAILY  30 tablet  9  . lisinopril-hydrochlorothiazide (PRINZIDE,ZESTORETIC) 10-12.5 MG per tablet TAKE 1  TABLET BY MOUTH EVERY DAY  30 tablet  5  . Multiple Vitamins-Minerals (SENIOR MULTIVITAMIN PLUS) TABS Take 1 tablet by mouth.        . Omega-3 Fatty Acids (FISH OIL) 1000 MG CAPS Take 1 capsule by mouth daily.      . rosuvastatin (CRESTOR) 20 MG tablet Take 1 tablet (20 mg total) by mouth daily.  30 tablet  6  . albuterol (PROAIR HFA) 108 (90 BASE) MCG/ACT inhaler Inhale 2 puffs into the lungs every 4 (four) hours as needed for wheezing.  1 Inhaler  2  . fluticasone (FLONASE) 50 MCG/ACT nasal spray Place 2 sprays into the nose daily.  16 g  6   No current facility-administered medications on file prior to visit.     Review of Systems Positive for wasp sting in multiple areas. Right breast is red, swollen and irritated.  Negative for fever, chills, drainage from nipple or from area where stung    Objective:  BP 129/82  Pulse 77  Temp(Src) 98.3 F (36.8 C) (Oral)  Resp 14  Ht 5\' 4"  (1.626 m)  Wt 150 lb 8 oz (68.266 kg)  BMI 25.82 kg/m2  SpO2 95%   Physical Exam  Constitutional: She is oriented to person, place, and time. No distress.  Cardiovascular: Normal rate and regular rhythm.   Pulmonary/Chest: Effort normal. No respiratory distress.    Musculoskeletal: Normal range of motion.  Neurological: She is alert and oriented to person, place, and time.  Skin: Skin is warm and dry.  Psychiatric: She has a normal mood and affect. Her behavior is normal. Judgment and thought content normal.      Assessment & Plan:   1. Wasp sting, accidental or unintentional, initial encounter Cool compresses to the area. Start Keflex. RTC for follow up in 1 week.

## 2014-04-13 ENCOUNTER — Ambulatory Visit (INDEPENDENT_AMBULATORY_CARE_PROVIDER_SITE_OTHER): Payer: Medicare HMO | Admitting: Adult Health

## 2014-04-13 ENCOUNTER — Encounter: Payer: Self-pay | Admitting: Adult Health

## 2014-04-13 VITALS — BP 118/66 | HR 75 | Temp 98.6°F | Resp 16 | Ht 64.0 in | Wt 149.8 lb

## 2014-04-13 DIAGNOSIS — T63461D Toxic effect of venom of wasps, accidental (unintentional), subsequent encounter: Secondary | ICD-10-CM

## 2014-04-13 DIAGNOSIS — Z5189 Encounter for other specified aftercare: Secondary | ICD-10-CM

## 2014-04-13 DIAGNOSIS — T6391XA Toxic effect of contact with unspecified venomous animal, accidental (unintentional), initial encounter: Secondary | ICD-10-CM

## 2014-04-13 DIAGNOSIS — T63461A Toxic effect of venom of wasps, accidental (unintentional), initial encounter: Secondary | ICD-10-CM | POA: Insufficient documentation

## 2014-04-13 NOTE — Progress Notes (Signed)
Patient ID: [name] Day, female   DOB: 16-Mar-1946, 68 y.o.   MRN: [id number]   Subjective:    Patient ID: [name] Day, female    DOB: [birth year], 68 y.o.   MRN: [id number]  HPI  Pt presents for f/u right breast wasp sting. She was seen in clinic on 04/08/14 following being stung by multiple wasps. Her right breast was the worse with swelling, redness. Given Cephalexin. Still taking and will complete course on 04/15/14. Reports almost complete resolution of swelling and redness. Denies any current problems.  Past Medical History  Diagnosis Date  . Lichen sclerosus et atrophicus of the vulva 01/2011  . allergic rhinitis   . COPD (chronic obstructive pulmonary disease)     PFTS 2013 FEV1 nearly normalized  . Depression   . Diabetes mellitus   . GERD (gastroesophageal reflux disease)   . Hyperlipidemia   . Hypertension   . Neuropathy     diabetic  . Hypothyroid 2002  . Rheumatic fever     HX  . Raynauds disease     Current Outpatient Prescriptions on File Prior to Visit  Medication Sig Dispense Refill  . ALPRAZolam (XANAX) 0.25 MG tablet Take 1 tablet (0.25 mg total) by mouth 3 (three) times daily as needed.  90 tablet  0  . CELEBREX 200 MG capsule TAKE ONE CAPSULE BY MOUTH TWICE A DAY  60 capsule  3  . cephALEXin (KEFLEX) 500 MG capsule Take 1 capsule (500 mg total) by mouth 2 (two) times daily.  14 capsule  0  . diltiazem (CARDIZEM) 30 MG tablet Take 1 tablet (30 mg total) by mouth 3 (three) times daily as needed.  90 tablet  3  . fexofenadine (ALLEGRA) 180 MG tablet Take 180 mg by mouth daily.      Marland Kitchen FLUoxetine (PROZAC) 40 MG capsule TAKE 1 CAPSULE BY MOUTH EVERY DAY  90 capsule  2  . gabapentin (NEURONTIN) 300 MG capsule TAKE ONE CAPSULE BY MOUTH WITH BREAKFAST AND LUNCH AND 2 CAPSULES EVERY NIGHT AT BEDTIME  360 capsule  3  . hydroxychloroquine (PLAQUENIL) 200 MG tablet Take 200 mg by mouth daily.      Marland Kitchen levothyroxine (SYNTHROID, LEVOTHROID) 100 MCG tablet TAKE 1 TABLET  BY MOUTH ONCE DAILY  30 tablet  9  . lisinopril-hydrochlorothiazide (PRINZIDE,ZESTORETIC) 10-12.5 MG per tablet TAKE 1 TABLET BY MOUTH EVERY DAY  30 tablet  5  . Multiple Vitamins-Minerals (SENIOR MULTIVITAMIN PLUS) TABS Take 1 tablet by mouth.        . Omega-3 Fatty Acids (FISH OIL) 1000 MG CAPS Take 1 capsule by mouth daily.      . rosuvastatin (CRESTOR) 20 MG tablet Take 1 tablet (20 mg total) by mouth daily.  30 tablet  6  . albuterol (PROAIR HFA) 108 (90 BASE) MCG/ACT inhaler Inhale 2 puffs into the lungs every 4 (four) hours as needed for wheezing.  1 Inhaler  2  . fluticasone (FLONASE) 50 MCG/ACT nasal spray Place 2 sprays into the nose daily.  16 g  6   No current facility-administered medications on file prior to visit.     Review of Systems Positive for wasp sting right breast on Keflex Negative for swelling, fever, chills All other systems negative    Objective:  BP 118/66  Pulse 75  Temp(Src) 98.6 F (37 C) (Oral)  Resp 16  Ht 5\' 4"  (1.626 m)  Wt 149 lb 12.8 oz (67.949 kg)  BMI 25.70 kg/m2  SpO2 97%  Physical Exam  Right breast with swelling completely resolved. Minimal redness at site of sting.     Assessment & Plan:   1. Wasp sting, accidental or unintentional, subsequent encounter Almost complete resolution of right breast area where stung. Continue antibiotic until complete. RTC prn.

## 2014-04-13 NOTE — Progress Notes (Signed)
Pre visit review using our clinic review tool, if applicable. No additional management support is needed unless otherwise documented below in the visit note. 

## 2014-04-21 ENCOUNTER — Other Ambulatory Visit: Payer: Self-pay | Admitting: Internal Medicine

## 2014-05-31 ENCOUNTER — Ambulatory Visit (INDEPENDENT_AMBULATORY_CARE_PROVIDER_SITE_OTHER): Payer: Commercial Managed Care - HMO | Admitting: Internal Medicine

## 2014-05-31 ENCOUNTER — Other Ambulatory Visit: Payer: Self-pay | Admitting: Internal Medicine

## 2014-05-31 ENCOUNTER — Encounter: Payer: Self-pay | Admitting: Internal Medicine

## 2014-05-31 VITALS — BP 124/74 | HR 70 | Temp 98.3°F | Resp 16 | Ht 64.0 in | Wt 145.8 lb

## 2014-05-31 DIAGNOSIS — C439 Malignant melanoma of skin, unspecified: Secondary | ICD-10-CM

## 2014-05-31 DIAGNOSIS — F411 Generalized anxiety disorder: Secondary | ICD-10-CM

## 2014-05-31 MED ORDER — ALPRAZOLAM 0.25 MG PO TABS: 0.2500 mg | ORAL_TABLET | Freq: Every evening | ORAL | Status: DC | PRN

## 2014-05-31 NOTE — Progress Notes (Signed)
Patient ID: BRIAR SWORD, female   DOB: 1946/08/19, 68 y.o.   MRN: [id number]   Patient Active Problem List   Diagnosis Date Noted  . Acral lentiginous melanoma 06/01/2014  . Wasp sting 04/13/2014  . Plantar fasciitis of right foot 10/21/2013  . Flushing reaction 10/21/2013  . Former smoker 04/09/2013  . Palpitations 03/25/2013  . Routine general medical examination at a health care facility 08/30/2012  . Inflammatory polyarthritis 05/12/2012  . Diabetes mellitus with neuropathy 02/10/2012  . GERD (gastroesophageal reflux disease)   . Hypertension   . Allergic rhinitis 01/14/2012  . Anxiety state, unspecified 05/14/2011  . Osteopenia 03/01/2011  . Mixed hyperlipidemia 01/31/2011    Subjective:  CC:   Chief Complaint  Patient presents with  . Acute Visit    nail discoloration to 3-4 phalange on left hand.    HPI:   [name] Day is a 68 y.o. female who presents for Progressive discoloration of the nail and nailbed on two fingers on left hand.  Patient first noticed discoloration of the nail near thr cuticle on ring finger in July.  She denies any history of trauma or pain, and denies any recent use of  Nail polish or work with staining solvents.  She does have well water in her home but drinks only bottled water.  No personal history of cancer or thromboembolic disease.  Recently was treated with oral cephalexin for cellulitis of right breast triggered by  hymenoptra (bee stings) .  She became concerned when the middle finger developed a similar pattern of staining last month. She has a friend who was diagnosed with melanoma after developing similar hyperpigmentatio of her finger nails and is quite concerned that she may have the same.    Past Medical History  Diagnosis Date  . Lichen sclerosus et atrophicus of the vulva 01/2011  . allergic rhinitis   . COPD (chronic obstructive pulmonary disease)     PFTS 2013 FEV1 nearly normalized  . Depression   . Diabetes  mellitus   . GERD (gastroesophageal reflux disease)   . Hyperlipidemia   . Hypertension   . Neuropathy     diabetic  . Hypothyroid 2002  . Rheumatic fever     HX  . Raynauds disease     Past Surgical History  Procedure Laterality Date  . Colonoscopy  2004  . Back surgery  2000  . Abdominal hysterectomy  1978  . Spine surgery      L3 to L5       The following portions of the patient's history were reviewed and updated as appropriate: Allergies, current medications, and problem list.    Review of Systems:   Patient denies headache, fevers, malaise, unintentional weight loss, skin rash, eye pain, sinus congestion and sinus pain, sore throat, dysphagia,  hemoptysis , cough, dyspnea, wheezing, chest pain, palpitations, orthopnea, edema, abdominal pain, nausea, melena, diarrhea, constipation, flank pain, dysuria, hematuria, urinary  Frequency, nocturia, numbness, tingling, seizures,  Focal weakness, Loss of consciousness,  Tremor, insomnia, depression, anxiety, and suicidal ideation.     History   Social History  . Marital Status: Widowed    Spouse Name: N/A    Number of Children: N/A  . Years of Education: N/A   Occupational History  . 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 Smoking status: Former Smoker -- 0.50 packs/day for 25 years    Types: Cigarettes    Quit date: 09/17/1993  .  Smokeless tobacco: Never Used  . Alcohol Use: No  . Drug Use: No  . Sexual Activity: Not Currently   Other Topics Concern  . Not on file   Social History Narrative  . No narrative on file    Objective:  Filed Vitals:   05/31/14 1804  BP: 124/74  Pulse: 70  Temp: 98.3 F (36.8 C)  Resp: 16     General appearance: alert, cooperative and appears stated age Lungs: clear to auscultation bilaterally Heart: regular rate and rhythm, S1, S2 normal, no murmur, click, rub or gallop Abdomen: soft, non-tender; bowel sounds normal; no  masses,  no organomegaly Pulses: 2+ and symmetric Skin: hyperpigmented streaking of nailbed starting at cuticle of middle and ring fingers on left hand .  Other fingernails and toenails not affected Lymph nodes: Cervical, supraclavicular, and axillary nodes normal.  Assessment and Plan:  Acral lentiginous melanoma Suspected based on exam and no other identifiable source of progressive discoloration of nail beds.  Urgent referral to Dermatology  Is in progress using first available practitioner.Marland Kitchen    Updated Medication List Outpatient Encounter Prescriptions as of 05/31/2014  Medication Sig  . ALPRAZolam (XANAX) 0.25 MG tablet Take 1 tablet (0.25 mg total) by mouth at bedtime as needed.  . CELEBREX 200 MG capsule TAKE ONE CAPSULE BY MOUTH TWICE A DAY  . diltiazem (CARDIZEM) 30 MG tablet Take 1 tablet (30 mg total) by mouth 3 (three) times daily as needed.  . fexofenadine (ALLEGRA) 180 MG tablet Take 180 mg by mouth daily.  Marland Kitchen FLUoxetine (PROZAC) 40 MG capsule TAKE 1 CAPSULE BY MOUTH EVERY DAY  . gabapentin (NEURONTIN) 300 MG capsule TAKE ONE CAPSULE BY MOUTH WITH BREAKFAST AND LUNCH AND 2 CAPSULES EVERY NIGHT AT BEDTIME  . hydroxychloroquine (PLAQUENIL) 200 MG tablet Take 200 mg by mouth daily.  Marland Kitchen levothyroxine (SYNTHROID, LEVOTHROID) 100 MCG tablet TAKE 1 TABLET BY MOUTH EVERY DAY  . lisinopril-hydrochlorothiazide (PRINZIDE,ZESTORETIC) 10-12.5 MG per tablet TAKE 1 TABLET BY MOUTH EVERY DAY  . Multiple Vitamins-Minerals (SENIOR MULTIVITAMIN PLUS) TABS Take 1 tablet by mouth.    . Omega-3 Fatty Acids (FISH OIL) 1000 MG CAPS Take 1 capsule by mouth daily.  . rosuvastatin (CRESTOR) 20 MG tablet Take 1 tablet (20 mg total) by mouth daily.  . [DISCONTINUED] ALPRAZolam (XANAX) 0.25 MG tablet Take 1 tablet (0.25 mg total) by mouth 3 (three) times daily as needed.  Marland Kitchen albuterol (PROAIR HFA) 108 (90 BASE) MCG/ACT inhaler Inhale 2 puffs into the lungs every 4 (four) hours as needed for wheezing.  .  fluticasone (FLONASE) 50 MCG/ACT nasal spray Place 2 sprays into the nose daily.  . [DISCONTINUED] cephALEXin (KEFLEX) 500 MG capsule Take 1 capsule (500 mg total) by mouth 2 (two) times daily.     Orders Placed This Encounter  Procedures  . Ambulatory referral to Dermatology    No Follow-up on file.

## 2014-05-31 NOTE — Progress Notes (Signed)
Pre-visit discussion using our clinic review tool. No additional management support is needed unless otherwise documented below in the visit note.  

## 2014-05-31 NOTE — Patient Instructions (Signed)
I am referring you to dermatology to rule out melanoma as the cause of your nailbed discoloration   I am refilling your alprazolam to help you rest  If you have not heard from [place] by Thursday , please call the office

## 2014-05-31 NOTE — Telephone Encounter (Signed)
Pt has appt today, needs repeat TSH, hold for result

## 2014-06-01 ENCOUNTER — Encounter: Payer: Self-pay | Admitting: Internal Medicine

## 2014-06-01 DIAGNOSIS — C439 Malignant melanoma of skin, unspecified: Secondary | ICD-10-CM | POA: Insufficient documentation

## 2014-06-01 NOTE — Assessment & Plan Note (Signed)
Suspected based on exam and no other identifiable source of progressive discoloration of nail beds.  Urgent referral to Dermatology  Is in progress using first available practitioner.Marland Kitchen

## 2014-06-08 ENCOUNTER — Telehealth: Payer: Self-pay | Admitting: Internal Medicine

## 2014-06-08 ENCOUNTER — Other Ambulatory Visit: Payer: Self-pay | Admitting: Internal Medicine

## 2014-06-08 DIAGNOSIS — C439 Malignant melanoma of skin, unspecified: Secondary | ICD-10-CM

## 2014-06-08 NOTE — Telephone Encounter (Signed)
I spoke with patient by phone today about her unsatisfactory referral for suspicious lesion on fingernail concerning for melanoma.  She was given an appt in December initially ,  [facility] and was told that she needed  To be seen more urgently, and was then given an appt for end of October.  This is still  Unacceptable .  I need to know who is covered by her insurance and I will make a physician to physician appeal to be seen by the end of September

## 2014-06-28 ENCOUNTER — Other Ambulatory Visit: Payer: Self-pay | Admitting: Internal Medicine

## 2014-07-28 ENCOUNTER — Other Ambulatory Visit: Payer: Self-pay | Admitting: Internal Medicine

## 2014-09-06 ENCOUNTER — Telehealth: Payer: Self-pay | Admitting: Internal Medicine

## 2014-09-06 NOTE — Telephone Encounter (Signed)
Pt request to have Zostavax and Teanus/Tdap. Please advise okay to schedule.msn

## 2014-09-07 NOTE — Telephone Encounter (Signed)
yes

## 2014-09-07 NOTE — Telephone Encounter (Signed)
Can patient be scheduled for a nurse visit to have zostavax and TD vaccine?

## 2014-09-07 NOTE — Telephone Encounter (Signed)
Per Dr. Derrel Nip patient can be added to nurse schedule for vaccines.

## 2014-09-15 ENCOUNTER — Encounter: Payer: Self-pay | Admitting: *Deleted

## 2014-09-22 ENCOUNTER — Encounter: Payer: Self-pay | Admitting: Internal Medicine

## 2014-09-22 ENCOUNTER — Ambulatory Visit: Payer: Medicare HMO

## 2014-09-29 ENCOUNTER — Ambulatory Visit: Payer: Medicare HMO

## 2014-10-06 ENCOUNTER — Ambulatory Visit (INDEPENDENT_AMBULATORY_CARE_PROVIDER_SITE_OTHER): Payer: Commercial Managed Care - HMO | Admitting: *Deleted

## 2014-10-06 ENCOUNTER — Other Ambulatory Visit: Payer: Self-pay | Admitting: Internal Medicine

## 2014-10-06 DIAGNOSIS — Z23 Encounter for immunization: Secondary | ICD-10-CM

## 2014-11-20 DIAGNOSIS — H6123 Impacted cerumen, bilateral: Secondary | ICD-10-CM | POA: Diagnosis not present

## 2014-11-20 DIAGNOSIS — J0100 Acute maxillary sinusitis, unspecified: Secondary | ICD-10-CM | POA: Diagnosis not present

## 2015-01-13 ENCOUNTER — Encounter: Payer: Self-pay | Admitting: Internal Medicine

## 2015-01-13 ENCOUNTER — Ambulatory Visit (INDEPENDENT_AMBULATORY_CARE_PROVIDER_SITE_OTHER): Payer: Commercial Managed Care - HMO | Admitting: Internal Medicine

## 2015-01-13 VITALS — BP 124/78 | HR 69 | Temp 98.7°F | Resp 14 | Ht 65.75 in | Wt 146.2 lb

## 2015-01-13 DIAGNOSIS — E119 Type 2 diabetes mellitus without complications: Secondary | ICD-10-CM | POA: Diagnosis not present

## 2015-01-13 DIAGNOSIS — M8580 Other specified disorders of bone density and structure, unspecified site: Secondary | ICD-10-CM

## 2015-01-13 DIAGNOSIS — E1040 Type 1 diabetes mellitus with diabetic neuropathy, unspecified: Secondary | ICD-10-CM | POA: Diagnosis not present

## 2015-01-13 DIAGNOSIS — I10 Essential (primary) hypertension: Secondary | ICD-10-CM | POA: Diagnosis not present

## 2015-01-13 DIAGNOSIS — Z889 Allergy status to unspecified drugs, medicaments and biological substances status: Secondary | ICD-10-CM

## 2015-01-13 DIAGNOSIS — D751 Secondary polycythemia: Secondary | ICD-10-CM | POA: Diagnosis not present

## 2015-01-13 DIAGNOSIS — E559 Vitamin D deficiency, unspecified: Secondary | ICD-10-CM

## 2015-01-13 DIAGNOSIS — E039 Hypothyroidism, unspecified: Secondary | ICD-10-CM | POA: Insufficient documentation

## 2015-01-13 DIAGNOSIS — Z0000 Encounter for general adult medical examination without abnormal findings: Secondary | ICD-10-CM | POA: Diagnosis not present

## 2015-01-13 DIAGNOSIS — R002 Palpitations: Secondary | ICD-10-CM | POA: Diagnosis not present

## 2015-01-13 DIAGNOSIS — Z87891 Personal history of nicotine dependence: Secondary | ICD-10-CM | POA: Diagnosis not present

## 2015-01-13 DIAGNOSIS — E034 Atrophy of thyroid (acquired): Secondary | ICD-10-CM

## 2015-01-13 DIAGNOSIS — E038 Other specified hypothyroidism: Secondary | ICD-10-CM

## 2015-01-13 DIAGNOSIS — E782 Mixed hyperlipidemia: Secondary | ICD-10-CM | POA: Diagnosis not present

## 2015-01-13 DIAGNOSIS — Z789 Other specified health status: Secondary | ICD-10-CM

## 2015-01-13 DIAGNOSIS — J0101 Acute recurrent maxillary sinusitis: Secondary | ICD-10-CM

## 2015-01-13 LAB — MICROALBUMIN / CREATININE URINE RATIO
CREATININE, U: 86.1 mg/dL
Microalb Creat Ratio: 0.8 mg/g (ref 0.0–30.0)

## 2015-01-13 LAB — COMPREHENSIVE METABOLIC PANEL
ALBUMIN: 4.4 g/dL (ref 3.5–5.2)
ALK PHOS: 85 U/L (ref 39–117)
ALT: 21 U/L (ref 0–35)
AST: 19 U/L (ref 0–37)
BUN: 17 mg/dL (ref 6–23)
CO2: 25 mEq/L (ref 19–32)
Calcium: 10.2 mg/dL (ref 8.4–10.5)
Chloride: 101 mEq/L (ref 96–112)
Creatinine, Ser: 0.62 mg/dL (ref 0.40–1.20)
GFR: 101.73 mL/min (ref >60.00)
Glucose, Bld: 104 mg/dL — ABNORMAL HIGH (ref 70–99)
POTASSIUM: 4.4 meq/L (ref 3.5–5.1)
SODIUM: 138 meq/L (ref 135–145)
TOTAL PROTEIN: 7.1 g/dL (ref 6.0–8.3)
Total Bilirubin: 0.4 mg/dL (ref 0.2–1.2)

## 2015-01-13 LAB — LIPID PANEL
CHOL/HDL RATIO: 6
CHOLESTEROL: 262 mg/dL — AB (ref 0–200)
HDL: 43.4 mg/dL (ref >39.00)
NonHDL: 218.6
TRIGLYCERIDES: 213 mg/dL — AB (ref 0.0–149.0)
VLDL: 42.6 mg/dL — ABNORMAL HIGH (ref 0.0–40.0)

## 2015-01-13 LAB — FERRITIN: Ferritin: 134.2 ng/mL (ref 10.0–291.0)

## 2015-01-13 LAB — LDL CHOLESTEROL, DIRECT: Direct LDL: 186 mg/dL

## 2015-01-13 LAB — MAGNESIUM: Magnesium: 1.8 mg/dL (ref 1.5–2.5)

## 2015-01-13 LAB — VITAMIN D 25 HYDROXY (VIT D DEFICIENCY, FRACTURES): VITD: 29.34 ng/mL — ABNORMAL LOW (ref 30.00–100.00)

## 2015-01-13 LAB — HEMOGLOBIN A1C: Hgb A1c MFr Bld: 5.8 % (ref 4.6–6.5)

## 2015-01-13 LAB — TSH: TSH: 1.34 u[IU]/mL (ref 0.35–4.50)

## 2015-01-13 MED ORDER — PREDNISONE 10 MG PO TABS: ORAL_TABLET | ORAL | Status: DC

## 2015-01-13 MED ORDER — ALBUTEROL SULFATE HFA 108 (90 BASE) MCG/ACT IN AERS: 2.0000 | INHALATION_SPRAY | RESPIRATORY_TRACT | Status: DC | PRN

## 2015-01-13 MED ORDER — LEVOFLOXACIN 500 MG PO TABS: 500.0000 mg | ORAL_TABLET | Freq: Every day | ORAL | Status: DC

## 2015-01-13 NOTE — Patient Instructions (Signed)
I am treating you for sinusitis, which is ca complication from your persistent sinus congestion.   I am prescribing an antibiotic (levaquin) and a prednisone taper  To manage the infection and the inflammation in your ear/sinuses.   I also advise use of the following OTC meds to help with your other symptoms.   Take generic OTC benadryl 25 mg at night,  Allegra 180 mg daily  Sudafed PE  10  mg every 8 hours for the congestion, you may substitute Afrin nasal spray for the nighttime dose of sudafed PE  If needed to prevent insomnia.  flush your sinuses twice daily with Nirl Med (do over the sink because if you do it right you will spit out globs of mucus)   CONTINUE YOUR PROBIOTIC ONCE OR TWICE DAILY   PRO AIR IF YOU NEED IT   Health Maintenance Adopting a healthy lifestyle and getting preventive care can go a long way to promote health and wellness. Talk with your health care provider about what schedule of regular examinations is right for you. This is a good chance for you to check in with your provider about disease prevention and staying healthy. In between checkups, there are plenty of things you can do on your own. Experts have done a lot of research about which lifestyle changes and preventive measures are most likely to keep you healthy. Ask your health care provider for more information. WEIGHT AND DIET  Eat a healthy diet  Be sure to include plenty of vegetables, fruits, low-fat dairy products, and lean protein.  Do not eat a lot of foods high in solid fats, added sugars, or salt.  Get regular exercise. This is one of the most important things you can do for your health.  Most adults should exercise for at least 150 minutes each week. The exercise should increase your heart rate and make you sweat (moderate-intensity exercise).  Most adults should also do strengthening exercises at least twice a week. This is in addition to the moderate-intensity exercise.  Maintain a healthy  weight  Body mass index (BMI) is a measurement that can be used to identify possible weight problems. It estimates body fat based on height and weight. Your health care provider can help determine your BMI and help you achieve or maintain a healthy weight.  For females 39 years of age and older:   A BMI below 18.5 is considered underweight.  A BMI of 18.5 to 24.9 is normal.  A BMI of 25 to 29.9 is considered overweight.  A BMI of 30 and above is considered obese.  Watch levels of cholesterol and blood lipids  You should start having your blood tested for lipids and cholesterol at 68 years of age, then have this test every 5 years.  You may need to have your cholesterol levels checked more often if:  Your lipid or cholesterol levels are high.  You are older than 68 years of age.  You are at high risk for heart disease.  CANCER SCREENING   Lung Cancer  Lung cancer screening is recommended for adults 65-72 years old who are at high risk for lung cancer because of a history of smoking.  A yearly low-dose CT scan of the lungs is recommended for people who:  Currently smoke.  Have quit within the past 15 years.  Have at least a 30-pack-year history of smoking. A pack year is smoking an average of one pack of cigarettes a day for 1 year.  Yearly screening should continue until it has been 15 years since you quit.  Yearly screening should stop if you develop a health problem that would prevent you from having lung cancer treatment.  Breast Cancer  Practice breast self-awareness. This means understanding how your breasts normally appear and feel.  It also means doing regular breast self-exams. Let your health care provider know about any changes, no matter how small.  If you are in your 20s or 30s, you should have a clinical breast exam (CBE) by a health care provider every 1-3 years as part of a regular health exam.  If you are 27 or older, have a CBE every year. Also  consider having a breast X-ray (mammogram) every year.  If you have a family history of breast cancer, talk to your health care provider about genetic screening.  If you are at high risk for breast cancer, talk to your health care provider about having an MRI and a mammogram every year.  Breast cancer gene (BRCA) assessment is recommended for women who have family members with BRCA-related cancers. BRCA-related cancers include:  Breast.  Ovarian.  Tubal.  Peritoneal cancers.  Results of the assessment will determine the need for genetic counseling and BRCA1 and BRCA2 testing. Cervical Cancer Routine pelvic examinations to screen for cervical cancer are no longer recommended for nonpregnant women who are considered low risk for cancer of the pelvic organs (ovaries, uterus, and vagina) and who do not have symptoms. A pelvic examination may be necessary if you have symptoms including those associated with pelvic infections. Ask your health care provider if a screening pelvic exam is right for you.   The Pap test is the screening test for cervical cancer for women who are considered at risk.  If you had a hysterectomy for a problem that was not cancer or a condition that could lead to cancer, then you no longer need Pap tests.  If you are older than 65 years, and you have had normal Pap tests for the past 10 years, you no longer need to have Pap tests.  If you have had past treatment for cervical cancer or a condition that could lead to cancer, you need Pap tests and screening for cancer for at least 20 years after your treatment.  If you no longer get a Pap test, assess your risk factors if they change (such as having a new sexual partner). This can affect whether you should start being screened again.  Some women have medical problems that increase their chance of getting cervical cancer. If this is the case for you, your health care provider may recommend more frequent screening and Pap  tests.  The human papillomavirus (HPV) test is another test that may be used for cervical cancer screening. The HPV test looks for the virus that can cause cell changes in the cervix. The cells collected during the Pap test can be tested for HPV.  The HPV test can be used to screen women 76 years of age and older. Getting tested for HPV can extend the interval between normal Pap tests from three to five years.  An HPV test also should be used to screen women of any age who have unclear Pap test results.  After 68 years of age, women should have HPV testing as often as Pap tests.  Colorectal Cancer  This type of cancer can be detected and often prevented.  Routine colorectal cancer screening usually begins at 68 years of age and continues  through 68 years of age.  Your health care provider may recommend screening at an earlier age if you have risk factors for colon cancer.  Your health care provider may also recommend using home test kits to check for hidden blood in the stool.  A small camera at the end of a tube can be used to examine your colon directly (sigmoidoscopy or colonoscopy). This is done to check for the earliest forms of colorectal cancer.  Routine screening usually begins at age 11.  Direct examination of the colon should be repeated every 5-10 years through 68 years of age. However, you may need to be screened more often if early forms of precancerous polyps or small growths are found. Skin Cancer  Check your skin from head to toe regularly.  Tell your health care provider about any new moles or changes in moles, especially if there is a change in a mole's shape or color.  Also tell your health care provider if you have a mole that is larger than the size of a pencil eraser.  Always use sunscreen. Apply sunscreen liberally and repeatedly throughout the day.  Protect yourself by wearing long sleeves, pants, a wide-brimmed hat, and sunglasses whenever you are  outside. HEART DISEASE, DIABETES, AND HIGH BLOOD PRESSURE   Have your blood pressure checked at least every 1-2 years. High blood pressure causes heart disease and increases the risk of stroke.  If you are between 74 years and 45 years old, ask your health care provider if you should take aspirin to prevent strokes.  Have regular diabetes screenings. This involves taking a blood sample to check your fasting blood sugar level.  If you are at a normal weight and have a low risk for diabetes, have this test once every three years after 68 years of age.  If you are overweight and have a high risk for diabetes, consider being tested at a younger age or more often. PREVENTING INFECTION  Hepatitis B  If you have a higher risk for hepatitis B, you should be screened for this virus. You are considered at high risk for hepatitis B if:  You were born in a country where hepatitis B is common. Ask your health care provider which countries are considered high risk.  Your parents were born in a high-risk country, and you have not been immunized against hepatitis B (hepatitis B vaccine).  You have HIV or AIDS.  You use needles to inject street drugs.  You live with someone who has hepatitis B.  You have had sex with someone who has hepatitis B.  You get hemodialysis treatment.  You take certain medicines for conditions, including cancer, organ transplantation, and autoimmune conditions. Hepatitis C  Blood testing is recommended for:  Everyone born from [birth year] through 1964.  Anyone with known risk factors for hepatitis C. Sexually transmitted infections (STIs)  You should be screened for sexually transmitted infections (STIs) including gonorrhea and chlamydia if:  You are sexually active and are younger than 68 years of age.  You are older than 68 years of age and your health care provider tells you that you are at risk for this type of infection.  Your sexual activity has changed since  you were last screened and you are at an increased risk for chlamydia or gonorrhea. Ask your health care provider if you are at risk.  If you do not have HIV, but are at risk, it may be recommended that you take a prescription medicine daily  to prevent HIV infection. This is called pre-exposure prophylaxis (PrEP). You are considered at risk if:  You are sexually active and do not regularly use condoms or know the HIV status of your partner(s).  You take drugs by injection.  You are sexually active with a partner who has HIV. Talk with your health care provider about whether you are at high risk of being infected with HIV. If you choose to begin PrEP, you should first be tested for HIV. You should then be tested every 3 months for as long as you are taking PrEP.  PREGNANCY   If you are premenopausal and you may become pregnant, ask your health care provider about preconception counseling.  If you may become pregnant, take 400 to 800 micrograms (mcg) of folic acid every day.  If you want to prevent pregnancy, talk to your health care provider about birth control (contraception). OSTEOPOROSIS AND MENOPAUSE   Osteoporosis is a disease in which the bones lose minerals and strength with aging. This can result in serious bone fractures. Your risk for osteoporosis can be identified using a bone density scan.  If you are 33 years of age or older, or if you are at risk for osteoporosis and fractures, ask your health care provider if you should be screened.  Ask your health care provider whether you should take a calcium or vitamin D supplement to lower your risk for osteoporosis.  Menopause may have certain physical symptoms and risks.  Hormone replacement therapy may reduce some of these symptoms and risks. Talk to your health care provider about whether hormone replacement therapy is right for you.  HOME CARE INSTRUCTIONS   Schedule regular health, dental, and eye exams.  Stay current with  your immunizations.   Do not use any tobacco products including cigarettes, chewing tobacco, or electronic cigarettes.  If you are pregnant, do not drink alcohol.  If you are breastfeeding, limit how much and how often you drink alcohol.  Limit alcohol intake to no more than 1 drink per day for nonpregnant women. One drink equals 12 ounces of beer, 5 ounces of wine, or 1 ounces of hard liquor.  Do not use street drugs.  Do not share needles.  Ask your health care provider for help if you need support or information about quitting drugs.  Tell your health care provider if you often feel depressed.  Tell your health care provider if you have ever been abused or do not feel safe at home. Document Released: 03/19/2011 Document Revised: 01/18/2014 Document Reviewed: 08/05/2013 [facility] Patient Information 2015 [place], [place]. This information is not intended to replace advice given to you by your health care provider. Make sure you discuss any questions you have with your health care provider.

## 2015-01-13 NOTE — Progress Notes (Signed)
Patient ID: [name] Day, female   DOB: Mar 01, 1946, 69 y.o.   MRN: [id number]   The patient is here for annual Medicare wellness examination and management of other chronic and acute problems.  Was treated for sinusitis with augmentin for 10 days on march 5 .  Her sinusitis symptoms resolved,  Then on April 3 developed flu like symptoms which have resolved but  For the past 5 days has noted that her Right maxillary sinsus is tender.  Has been using saline,  flushes,  Still having thick greenish gray discharge,  No fevers,  Some cough with intermittent wheezing ,        Needs mammogram ordered, prior callbacks have resulted in benigign readings by [facility] ofr dense breasts   History of polycythemia, no prior workup done.  Remote tobacco history, thinks her  mother may have had it .    Calcium needs discussed.Marland KitchenMarland KitchenLast DEXA was done in 2002  Overdue for colooscopy for history of polyps 2008  ,  Wants local follow up     The risk factors are reflected in the social history.  The roster of all physicians providing medical care to patient - is listed in the Snapshot section of the chart.  Activities of daily living:  The patient is 100% independent in all ADLs: dressing, toileting, feeding as well as independent mobility  Home safety : The patient has smoke detectors in the home. They wear seatbelts.  There are no firearms at home. There is no violence in the home.   There is no risks for hepatitis, STDs or HIV. There is no   history of blood transfusion. They have no travel history to infectious disease endemic areas of the world.  The patient has seen their dentist in the last six month. They have seen their eye doctor in the last year. They admit to slight hearing difficulty with regard to whispered voices and some television programs.  They have deferred audiologic testing in the last year.  They do not  have excessive sun exposure. Discussed the need for sun protection: hats, long sleeves and  use of sunscreen if there is significant sun exposure.   Diet: the importance of a healthy diet is discussed. They do have a healthy diet.  The benefits of regular aerobic exercise were discussed. She walks 4 times per week ,  20 minutes.   Depression screen: there are no signs or vegative symptoms of depression- irritability, change in appetite, anhedonia, sadness/tearfullness.  Cognitive assessment: the patient manages all their financial and personal affairs and is actively engaged. They could relate day,date,year and events; recalled 2/3 objects at 3 minutes; performed clock-face test normally.  The following portions of the patient's history were reviewed and updated as appropriate: allergies, current medications, past family history, past medical history,  past surgical history, past social history  and problem list.  Visual acuity was not assessed per patient preference since she has regular follow up with her ophthalmologist. Hearing and body mass index were assessed and reviewed.   During the course of the visit the patient was educated and counseled about appropriate screening and preventive services including : fall prevention , diabetes screening, nutrition counseling, colorectal cancer screening, and recommended immunizations.    Review of systems:  Patient denies headache, fevers, malaise, unintentional weight loss, skin rash, eye pain, sinus congestion and sinus pain, sore throat, dysphagia,  hemoptysis , cough, dyspnea, wheezing, chest pain, palpitations, orthopnea, edema, abdominal pain, nausea, melena, diarrhea, constipation, flank pain,  dysuria, hematuria, urinary  Frequency, nocturia, numbness, tingling, seizures,  Focal weakness, Loss of consciousness,  Tremor, insomnia, depression, anxiety, and suicidal ideation.    Objective:  BP 124/78 mmHg  Pulse 69  Temp(Src) 98.7 F (37.1 C) (Oral)  Resp 14  Ht 5' 5.75" (1.67 m)  Wt 146 lb 4 oz (66.339 kg)  BMI 23.79 kg/m2   SpO2 99% .General appearance: alert, cooperative and appears stated age Head: Normocephalic, without obvious abnormality, atraumatic Eyes: conjunctivae/corneas clear. PERRL, EOM's intact. Fundi benign. Ears: normal TM's and external ear canals both ears Nose: Nares normal. Septum midline. Mucosa boggy,  Right maxillary tenderness to palpatios. Throat: lips, mucosa, and tongue normal; teeth and gums normal Neck: no adenopathy, no carotid bruit, no JVD, supple, symmetrical, trachea midline and thyroid not enlarged, symmetric, no tenderness/mass/nodules Lungs: clear to auscultation bilaterally Breasts: normal appearance, no masses or tenderness Heart: regular rate and rhythm, S1, S2 normal, no murmur, click, rub or gallop Abdomen: soft, non-tender; bowel sounds normal; no masses,  no organomegaly Extremities: extremities normal, atraumatic, no cyanosis or edema Pulses: 2+ and symmetric Skin: Skin color, texture, turgor normal. No rashes or lesions Neurologic: Alert and oriented X 3, normal strength and tone. Normal symmetric reflexes. Normal coordination and gait.  Foot exam:  Nails are well trimmed,  No callouses,  Sensation intact to microfilament  Assessment and Plan:  Problem List Items Addressed This Visit    Mixed hyperlipidemia    New ACC guidelines recommend starting patients aged 71 or higher on moderate intensity statin therapy for LDL between 70-189 and 10 yr risk of CAD > 7.5% using the Framingham risk index;  and high intensity therapy for anyone with LDL > 190.  Howevere patient has demonstrated statin intolerance to Crestor due to mayalgias  BP 124/78 mmHg  Pulse 69  Temp(Src) 98.7 F (37.1 C) (Oral)  Resp 14  Ht 5' 5.75" (1.67 m)  Wt 146 lb 4 oz (66.339 kg)  BMI 23.79 kg/m2  SpO2 99%  Lab Results  Component Value Date   CHOL 262* 01/13/2015   HDL 43.40 01/13/2015   LDLCALC 81 03/08/2014   LDLDIRECT 186.0 01/13/2015   TRIG 213.0* 01/13/2015   CHOLHDL 6 01/13/2015          Osteopenia   Hypertension - Primary   Relevant Orders   Comprehensive metabolic panel (Completed)   Diabetes mellitus with neuropathy    Well-controlled on current medications.  hemoglobin A1c is well below 7.0  Lab Results  Component Value Date   HGBA1C 5.8 01/13/2015   Lab Results  Component Value Date   MICROALBUR <0.7 01/13/2015     . She is up-to-date on eye exams and her foot exam was done today.   She has no proteinuria  .  She is on the appropriate medications.          Routine general medical examination at a health care facility    Annual Medicare wellness  exam was done as well as a comprehensive physical exam and management of acute and chronic conditions .  During the course of the visit the patient was educated and counseled about appropriate screening and preventive services including : fall prevention , diabetes screening, nutrition counseling, colorectal cancer screening, and recommended immunizations.  Printed recommendations for health maintenance screenings was given.       Palpitations   Relevant Orders   Magnesium (Completed)   Former smoker   Relevant Orders   Lipid panel (Completed)   Hypothyroidism  Thyroid function is WNL on current dose.  No current changes needed.   Lab Results  Component Value Date   TSH 1.34 01/13/2015         Relevant Orders   TSH (Completed)   Statin intolerance   Sinusitis, acute maxillary    Secondary to immunosuppression with placquenil. recent treatment with augmentin.  Advised to take  a probiotic  Risk of c dificile colitis discussed.  Repeat treatment with levaquin, prednisone taper.       Relevant Medications   levofloxacin (LEVAQUIN) 500 MG tablet   predniSONE (DELTASONE) 10 MG tablet   Polycythemia, secondary    Persistent and progressive.  Remote smoking history   Possible family history  of PCV.  Hematology referral advised.     Lab Results  Component Value Date   WBC 8.4 03/08/2014    HGB 16.4* 03/08/2014   HCT 47.8* 03/08/2014   MCV 86.7 03/08/2014   PLT 276.0 03/08/2014         Relevant Orders   Hepatitis C antibody (Completed)   Iron and TIBC (Completed)   Ferritin (Completed)   Hemochromatosis DNA-PCR(c282y,h63d) (Completed)   Ambulatory referral to Hematology    Other Visit Diagnoses    Diabetes mellitus without complication        Relevant Orders    Hemoglobin A1c (Completed)    LDL cholesterol, direct (Completed)    Microalbumin / creatinine urine ratio (Completed)    Vitamin D deficiency        Relevant Orders    Vit D  25 hydroxy (rtn osteoporosis monitoring) (Completed)

## 2015-01-14 ENCOUNTER — Telehealth: Payer: Self-pay | Admitting: Internal Medicine

## 2015-01-14 LAB — IRON AND TIBC
%SAT: 28 % (ref 20–55)
Iron: 87 ug/dL (ref 42–145)
TIBC: 310 ug/dL (ref 250–470)
UIBC: 223 ug/dL (ref 125–400)

## 2015-01-14 LAB — HEPATITIS C ANTIBODY: HCV Ab: NEGATIVE

## 2015-01-16 ENCOUNTER — Encounter: Payer: Self-pay | Admitting: Internal Medicine

## 2015-01-16 DIAGNOSIS — D751 Secondary polycythemia: Secondary | ICD-10-CM | POA: Insufficient documentation

## 2015-01-16 DIAGNOSIS — Z789 Other specified health status: Secondary | ICD-10-CM | POA: Insufficient documentation

## 2015-01-16 DIAGNOSIS — J320 Chronic maxillary sinusitis: Secondary | ICD-10-CM | POA: Insufficient documentation

## 2015-01-16 NOTE — Assessment & Plan Note (Signed)
Persistent and progressive.  Remote smoking history   Possible family history  of PCV.  Hematology referral advised.     Lab Results  Component Value Date   WBC 8.4 03/08/2014   HGB 16.4* 03/08/2014   HCT 47.8* 03/08/2014   MCV 86.7 03/08/2014   PLT 276.0 03/08/2014

## 2015-01-16 NOTE — Assessment & Plan Note (Signed)
Well-controlled on current medications.  hemoglobin A1c is well below 7.0  Lab Results  Component Value Date   HGBA1C 5.8 01/13/2015   Lab Results  Component Value Date   MICROALBUR <0.7 01/13/2015     . She is up-to-date on eye exams and her foot exam was done today.   She has no proteinuria  .  She is on the appropriate medications.

## 2015-01-16 NOTE — Assessment & Plan Note (Signed)

## 2015-01-16 NOTE — Assessment & Plan Note (Addendum)
New ACC guidelines recommend starting patients aged 68 or higher on moderate intensity statin therapy for LDL between 70-189 and 10 yr risk of CAD > 7.5% using the Framingham risk index;  and high intensity therapy for anyone with LDL > 190.  Howevere patient has demonstrated statin intolerance to Crestor due to mayalgias  BP 124/78 mmHg  Pulse 69  Temp(Src) 98.7 F (37.1 C) (Oral)  Resp 14  Ht 5' 5.75" (1.67 m)  Wt 146 lb 4 oz (66.339 kg)  BMI 23.79 kg/m2  SpO2 99%  Lab Results  Component Value Date   CHOL 262* 01/13/2015   HDL 43.40 01/13/2015   LDLCALC 81 03/08/2014   LDLDIRECT 186.0 01/13/2015   TRIG 213.0* 01/13/2015   CHOLHDL 6 01/13/2015

## 2015-01-16 NOTE — Assessment & Plan Note (Addendum)
Secondary to immunosuppression with placquenil. recent treatment with augmentin.  Advised to take  a probiotic  Risk of c dificile colitis discussed.  Repeat treatment with levaquin, prednisone taper.

## 2015-01-16 NOTE — Assessment & Plan Note (Signed)
Thyroid function is WNL on current dose.  No current changes needed.   Lab Results  Component Value Date   TSH 1.34 01/13/2015

## 2015-01-17 LAB — HEMOCHROMATOSIS DNA-PCR(C282Y,H63D)

## 2015-01-18 ENCOUNTER — Encounter: Payer: Self-pay | Admitting: Internal Medicine

## 2015-01-20 ENCOUNTER — Encounter: Payer: Self-pay | Admitting: Internal Medicine

## 2015-01-20 DIAGNOSIS — R7989 Other specified abnormal findings of blood chemistry: Secondary | ICD-10-CM

## 2015-01-20 DIAGNOSIS — R718 Other abnormality of red blood cells: Secondary | ICD-10-CM

## 2015-01-20 DIAGNOSIS — D582 Other hemoglobinopathies: Secondary | ICD-10-CM | POA: Insufficient documentation

## 2015-01-26 ENCOUNTER — Other Ambulatory Visit: Payer: Self-pay | Admitting: Internal Medicine

## 2015-02-03 ENCOUNTER — Inpatient Hospital Stay: Payer: Commercial Managed Care - HMO | Attending: Oncology | Admitting: Oncology

## 2015-02-03 VITALS — BP 136/78 | HR 80 | Temp 97.1°F | Resp 20 | Ht 65.0 in | Wt 147.7 lb

## 2015-02-03 DIAGNOSIS — E039 Hypothyroidism, unspecified: Secondary | ICD-10-CM | POA: Diagnosis not present

## 2015-02-03 DIAGNOSIS — Z79899 Other long term (current) drug therapy: Secondary | ICD-10-CM | POA: Diagnosis not present

## 2015-02-03 DIAGNOSIS — E1140 Type 2 diabetes mellitus with diabetic neuropathy, unspecified: Secondary | ICD-10-CM | POA: Insufficient documentation

## 2015-02-03 DIAGNOSIS — J449 Chronic obstructive pulmonary disease, unspecified: Secondary | ICD-10-CM | POA: Insufficient documentation

## 2015-02-03 DIAGNOSIS — D751 Secondary polycythemia: Secondary | ICD-10-CM | POA: Diagnosis not present

## 2015-02-03 DIAGNOSIS — I7300 Raynaud's syndrome without gangrene: Secondary | ICD-10-CM | POA: Insufficient documentation

## 2015-02-03 DIAGNOSIS — I10 Essential (primary) hypertension: Secondary | ICD-10-CM | POA: Insufficient documentation

## 2015-02-03 DIAGNOSIS — E785 Hyperlipidemia, unspecified: Secondary | ICD-10-CM | POA: Diagnosis not present

## 2015-02-07 LAB — MISC LABCORP TEST (SEND OUT)

## 2015-02-08 ENCOUNTER — Other Ambulatory Visit: Payer: Self-pay | Admitting: Internal Medicine

## 2015-02-09 LAB — JAK2 GENOTYPR

## 2015-02-09 LAB — ERYTHROPOIETIN: ERYTHROPOIETIN: 12.5 m[IU]/mL (ref 2.6–18.5)

## 2015-02-11 ENCOUNTER — Encounter: Payer: Self-pay | Admitting: *Deleted

## 2015-02-18 NOTE — Progress Notes (Signed)
[place]  [phone]) [phone] Fax:(336) [phone]  ID: [name] Day OB: 23-Mar-1946  MR#: [id number]  IWP#:[id number]  Patient Care Team: Crecencio Mc, MD as PCP - General (Internal Medicine)  CHIEF COMPLAINT:  Chief Complaint  Patient presents with  . New Evaluation    Polycythemia    INTERVAL HISTORY: Patient is a 69 year old female who was found to have an increased hemoglobin on routine blood work. Repeat bloodwork confirmed the results.  She currently feels well and is asymptomatic. She has no neurologic complaints. She denies any recent fevers. She has a good appetite and denies weight loss. She denies any chest pain or shortness of breath. She denies any nausea, vomiting, constipation, or diarrhea. She has no urinary complaints. Patient feels at her baseline and offers no specific complaints today.  REVIEW OF SYSTEMS:   Review of Systems  Constitutional: Negative.     As per HPI. Otherwise, a complete review of systems is negatve.  PAST MEDICAL HISTORY: Past Medical History  Diagnosis Date  . Lichen sclerosus et atrophicus of the vulva 01/2011  . allergic rhinitis   . COPD (chronic obstructive pulmonary disease)     PFTS 2013 FEV1 nearly normalized  . Depression   . Diabetes mellitus   . GERD (gastroesophageal reflux disease)   . Hyperlipidemia   . Hypertension   . Neuropathy     diabetic  . Hypothyroid 2002  . Rheumatic fever     HX  . Raynauds disease     PAST SURGICAL HISTORY: Past Surgical History  Procedure Laterality Date  . Colonoscopy  2004  . Back surgery  2000  . Abdominal hysterectomy  1978  . Spine surgery      L3 to L5    FAMILY HISTORY Family History  Problem Relation Age of Onset  . Heart disease Mother     CHF and cardiomyopathy  . Thyroid disease Mother   . Hyperlipidemia Mother   . Hyperlipidemia Father   . Cancer Father     liver  . Lupus Sister   . Hyperlipidemia Sister        ADVANCED  DIRECTIVES:    HEALTH MAINTENANCE: History  Substance Use Topics  . Smoking status: Former Smoker -- 0.50 packs/day for 25 years    Types: Cigarettes    Quit date: 09/17/1993  . Smokeless tobacco: Never Used  . Alcohol Use: No     Colonoscopy:  PAP:  Bone density:  Lipid panel:  Allergies  Allergen Reactions  . Aspirin Other (See Comments)    GI  bleed  . Statins Other (See Comments)    LEG & ARM CRAMPS  . Codeine Rash    Current Outpatient Prescriptions  Medication Sig Dispense Refill  . cholecalciferol (VITAMIN D) 400 UNITS TABS tablet Take 400 Units by mouth.    . fexofenadine (ALLEGRA) 180 MG tablet Take 180 mg by mouth daily.    Marland Kitchen FLUoxetine (PROZAC) 40 MG capsule TAKE ONE CAPSULE BY MOUTH EVERY DAY 90 capsule 0  . gabapentin (NEURONTIN) 300 MG capsule TAKE ONE CAPSULE BY MOUTH WITH BREAKFAST AND LUNCH AND 2 CAPSULES EVERY NIGHT AT BEDTIME 360 capsule 3  . lactobacillus acidophilus (BACID) TABS tablet Take 1 tablet by mouth daily.    Marland Kitchen levothyroxine (SYNTHROID, LEVOTHROID) 100 MCG tablet TAKE 1 TABLET BY MOUTH EVERY DAY 30 tablet 11  . Multiple Vitamins-Minerals (SENIOR MULTIVITAMIN PLUS) TABS Take 1 tablet by mouth.      . Omega-3 Fatty Acids (  FISH OIL) 1000 MG CAPS Take 1 capsule by mouth daily.    Marland Kitchen albuterol (PROAIR HFA) 108 (90 BASE) MCG/ACT inhaler Inhale 2 puffs into the lungs every 4 (four) hours as needed for wheezing. (Patient not taking: Reported on 02/03/2015) 1 Inhaler 2  . ALPRAZolam (XANAX) 0.25 MG tablet Take 1 tablet (0.25 mg total) by mouth at bedtime as needed. (Patient not taking: Reported on 02/03/2015) 30 tablet 2  . fluticasone (FLONASE) 50 MCG/ACT nasal spray Place 2 sprays into the nose daily. 16 g 6  . hydroxychloroquine (PLAQUENIL) 200 MG tablet Take 200 mg by mouth daily.    Marland Kitchen levofloxacin (LEVAQUIN) 500 MG tablet Take 1 tablet (500 mg total) by mouth daily. (Patient not taking: Reported on 02/03/2015) 7 tablet 0  .  lisinopril-hydrochlorothiazide (PRINZIDE,ZESTORETIC) 10-12.5 MG per tablet TAKE 1 TABLET BY MOUTH EVERY DAY 30 tablet 6  . predniSONE (DELTASONE) 10 MG tablet 6 tablets on Day 1 , then reduce by 1 tablet daily until gone (Patient not taking: Reported on 02/03/2015) 21 tablet 0   No current facility-administered medications for this visit.    OBJECTIVE: Filed Vitals:   02/03/15 1545  BP: 136/78  Pulse: 80  Temp: 97.1 F (36.2 C)  Resp: 20     Body mass index is 24.58 kg/(m^2).    ECOG FS:0 - Asymptomatic  General: Well-developed, well-nourished, no acute distress. Eyes: Pink conjunctiva, anicteric sclera. HEENT: Normocephalic, moist mucous membranes, clear oropharnyx. Lungs: Clear to auscultation bilaterally. Heart: Regular rate and rhythm. No rubs, murmurs, or gallops. Abdomen: Soft, nontender, nondistended. No organomegaly noted, normoactive bowel sounds. Musculoskeletal: No edema, cyanosis, or clubbing. Neuro: Alert, answering all questions appropriately. Cranial nerves grossly intact. Skin: No rashes or petechiae noted. Psych: Normal affect. Lymphatics: No cervical, calvicular, axillary or inguinal LAD.   LAB RESULTS:  Lab Results  Component Value Date   NA 138 01/13/2015   K 4.4 01/13/2015   CL 101 01/13/2015   CO2 25 01/13/2015   GLUCOSE 104* 01/13/2015   BUN 17 01/13/2015   CREATININE 0.62 01/13/2015   CALCIUM 10.2 01/13/2015   PROT 7.1 01/13/2015   ALBUMIN 4.4 01/13/2015   AST 19 01/13/2015   ALT 21 01/13/2015   ALKPHOS 85 01/13/2015   BILITOT 0.4 01/13/2015    Lab Results  Component Value Date   WBC 8.4 03/08/2014   NEUTROABS 5.7 03/08/2014   HGB 16.4* 03/08/2014   HCT 47.8* 03/08/2014   MCV 86.7 03/08/2014   PLT 276.0 03/08/2014     STUDIES: No results found.  ASSESSMENT: Polycythemia.  PLAN:    1. Polycythemia: Possibly related to underlying COPD. Patient's iron stores are within normal limits therefore it is likely related to  hemochromatosis.  JAK-2 mutation is negative. Carboxyhemoglobin and erythropoietin levels are within normal limits. No intervention is needed at this time. Patient does not require Hydrea or phlebotomy. Return to clinic in 3 months with repeat laboratory work and further evaluation.  Patient expressed understanding and was in agreement with this plan. She also understands that She can call clinic at any time with any questions, concerns, or complaints.    Lloyd Huger, MD   02/18/2015 2:03 PM

## 2015-04-07 ENCOUNTER — Ambulatory Visit (INDEPENDENT_AMBULATORY_CARE_PROVIDER_SITE_OTHER): Payer: Commercial Managed Care - HMO | Admitting: Family Medicine

## 2015-04-07 ENCOUNTER — Encounter: Payer: Self-pay | Admitting: Family Medicine

## 2015-04-07 VITALS — BP 148/98 | HR 79 | Temp 98.1°F | Ht 65.0 in | Wt 143.0 lb

## 2015-04-07 DIAGNOSIS — J320 Chronic maxillary sinusitis: Secondary | ICD-10-CM

## 2015-04-07 MED ORDER — CLINDAMYCIN HCL 300 MG PO CAPS: 300.0000 mg | ORAL_CAPSULE | Freq: Four times a day (QID) | ORAL | Status: DC

## 2015-04-07 NOTE — Patient Instructions (Signed)
Take the antibiotic as prescribed.  We will be in touch regarding ENT referral.  Continue nasal saline and flonase.  Take care  Dr. Lacinda Axon

## 2015-04-07 NOTE — Progress Notes (Signed)
   Subjective:    Patient ID: [name] Day, female    DOB: [birth year], 69 y.o.   MRN: [id number]  HPI 69 year old female with a past medical history of Lipidemia, hypertension, DM with neuropathy, inflammatory polyarthritis on plaquenil, and polycythemia presents for an acute visit for with complaints of sinus pain/pressure.  1) Sinus pain/pressure  Patient reports she's been sick since March.  She states that she developed sinus pain and pressure as well as headache and associated drainage.  She was seen at a local urgent care and was diagnosed with sinusitis and treated with Augmentin.  Patient continued to have symptoms despite completion of antibody therapy and saw Dr. Derrel Nip on 4/28.  At that point time she was started on Levaquin.  Patient presented today with continued complaints of sinus pain/pressure (right-sided), headache, postnasal drainage, and associated dizziness.  Patient states she has had no relief or improvement following the above treatment. She reports that is very bothersome and moderate to severe.   Additionally, she's been using nasal saline as well as Flonase with little relief.  No known inciting or exacerbating factors.  She reports that she has had associated "sweats". No fever. No reports of shortness of breath.  Of note, patient is on plaquenil.   Social Hx: Former Research scientist (life sciences).   Review of Systems Per HPI. All other systems negative.    Objective:   Physical Exam Filed Vitals:   04/07/15 1414  BP: 148/98  Pulse: 79  Temp: 98.1 F (36.7 C)  Vital signs reviewed.  Exam: General: fatigued elderly female in NAD.  HEENT: NCAT. Eyes - conjunctiva normal. No scleral icterus. Ears - obscured by cerumen. Throat - mildly erythematous. No exudates noted.   Nose - no drainage. Normal appearing turbinates. R maxillary sinus tender to palpation.  Cardiovascular: RRR. No murmurs, rubs, or gallops. Respiratory: CTAB. No rales, rhonchi, or wheeze. Abdomen:  soft, nontender, nondistended. No palpable organomegaly.  Extremities: warm, well perfused. No LE edema. Skin: Warm, dry, intact.    Assessment & Plan:  See Problem List1

## 2015-04-07 NOTE — Assessment & Plan Note (Signed)
Patient has been batting sinusitis since March (making this now a chronic issue). Given duration of illness and persistent symptoms, I am treating her with Clindamycin. Additionally, referring to ENT for further evaluation and treatment options.

## 2015-04-08 NOTE — Addendum Note (Signed)
Addended by: Coral Spikes on: 04/08/2015 09:05 AM   Modules accepted: Orders

## 2015-05-09 ENCOUNTER — Inpatient Hospital Stay: Payer: Commercial Managed Care - HMO | Attending: Oncology

## 2015-05-09 ENCOUNTER — Inpatient Hospital Stay (HOSPITAL_BASED_OUTPATIENT_CLINIC_OR_DEPARTMENT_OTHER): Payer: Commercial Managed Care - HMO | Admitting: Oncology

## 2015-05-09 VITALS — BP 206/90 | HR 84 | Temp 96.2°F | Resp 18 | Wt 144.6 lb

## 2015-05-09 DIAGNOSIS — I10 Essential (primary) hypertension: Secondary | ICD-10-CM

## 2015-05-09 DIAGNOSIS — E039 Hypothyroidism, unspecified: Secondary | ICD-10-CM

## 2015-05-09 DIAGNOSIS — J449 Chronic obstructive pulmonary disease, unspecified: Secondary | ICD-10-CM | POA: Diagnosis not present

## 2015-05-09 DIAGNOSIS — Z79899 Other long term (current) drug therapy: Secondary | ICD-10-CM

## 2015-05-09 DIAGNOSIS — D751 Secondary polycythemia: Secondary | ICD-10-CM | POA: Diagnosis not present

## 2015-05-09 DIAGNOSIS — K219 Gastro-esophageal reflux disease without esophagitis: Secondary | ICD-10-CM

## 2015-05-09 DIAGNOSIS — E1140 Type 2 diabetes mellitus with diabetic neuropathy, unspecified: Secondary | ICD-10-CM | POA: Diagnosis not present

## 2015-05-09 DIAGNOSIS — B029 Zoster without complications: Secondary | ICD-10-CM | POA: Insufficient documentation

## 2015-05-09 DIAGNOSIS — Z87891 Personal history of nicotine dependence: Secondary | ICD-10-CM | POA: Diagnosis not present

## 2015-05-09 DIAGNOSIS — E785 Hyperlipidemia, unspecified: Secondary | ICD-10-CM | POA: Diagnosis not present

## 2015-05-09 LAB — CBC WITH DIFFERENTIAL/PLATELET
BASOS PCT: 1 %
Basophils Absolute: 0.1 10*3/uL (ref 0–0.1)
EOS ABS: 0.4 10*3/uL (ref 0–0.7)
Eosinophils Relative: 4 %
HCT: 45.3 % (ref 35.0–47.0)
Hemoglobin: 15.5 g/dL (ref 12.0–16.0)
LYMPHS PCT: 22 %
Lymphs Abs: 2.2 10*3/uL (ref 1.0–3.6)
MCH: 29 pg (ref 26.0–34.0)
MCHC: 34.2 g/dL (ref 32.0–36.0)
MCV: 84.7 fL (ref 80.0–100.0)
Monocytes Absolute: 1 10*3/uL — ABNORMAL HIGH (ref 0.2–0.9)
Monocytes Relative: 9 %
Neutro Abs: 6.5 10*3/uL (ref 1.4–6.5)
Neutrophils Relative %: 64 %
PLATELETS: 266 10*3/uL (ref 150–440)
RBC: 5.35 MIL/uL — AB (ref 3.80–5.20)
RDW: 13.4 % (ref 11.5–14.5)
WBC: 10.2 10*3/uL (ref 3.6–11.0)

## 2015-05-09 LAB — FERRITIN: FERRITIN: 143 ng/mL (ref 11–307)

## 2015-05-09 MED ORDER — VALACYCLOVIR HCL 1 G PO TABS: 1000.0000 mg | ORAL_TABLET | Freq: Three times a day (TID) | ORAL | Status: DC

## 2015-05-09 NOTE — Progress Notes (Signed)
Patient is concerned today about a rash that she has on her left side for 8-9 days, which has caused her bp to be elevated today.  She does have a history of shingles and this rash feels similar.

## 2015-05-11 DIAGNOSIS — J301 Allergic rhinitis due to pollen: Secondary | ICD-10-CM | POA: Diagnosis not present

## 2015-05-11 DIAGNOSIS — Z87891 Personal history of nicotine dependence: Secondary | ICD-10-CM | POA: Diagnosis not present

## 2015-05-11 DIAGNOSIS — H6123 Impacted cerumen, bilateral: Secondary | ICD-10-CM | POA: Diagnosis not present

## 2015-05-11 DIAGNOSIS — J329 Chronic sinusitis, unspecified: Secondary | ICD-10-CM | POA: Diagnosis not present

## 2015-05-13 NOTE — Progress Notes (Signed)
[name]  Telephone:(336) [phone] Fax:(336) [phone]  ID: [name] Day OB: 04/16/1946  MR#: [id number]  HBZ#:[id number]  Patient Care Team: Crecencio Mc, MD as PCP - General (Internal Medicine)  CHIEF COMPLAINT:  Chief Complaint  Patient presents with  . Follow-up    polycythemia    INTERVAL HISTORY: Patient  Returns to clinic today for repeat laboratory work and further evaluation. Her only complaint today is of significant rash on her left flank that is mildly tender. She otherwise feels well and is asymptomatic. She has no neurologic complaints. She denies any recent fevers. She has a good appetite and denies weight loss. She denies any chest pain or shortness of breath. She denies any nausea, vomiting, constipation, or diarrhea. She has no urinary complaints. Patient offers no further specific complaints today.  REVIEW OF SYSTEMS:   Review of Systems  Constitutional: Negative.   Skin: Positive for rash.  Neurological: Negative.     As per HPI. Otherwise, a complete review of systems is negatve.  PAST MEDICAL HISTORY: Past Medical History  Diagnosis Date  . Lichen sclerosus et atrophicus of the vulva 01/2011  . allergic rhinitis   . COPD (chronic obstructive pulmonary disease)     PFTS 2013 FEV1 nearly normalized  . Depression   . Diabetes mellitus   . GERD (gastroesophageal reflux disease)   . Hyperlipidemia   . Hypertension   . Neuropathy     diabetic  . Hypothyroid 2002  . Rheumatic fever     HX  . Raynauds disease     PAST SURGICAL HISTORY: Past Surgical History  Procedure Laterality Date  . Colonoscopy  2004  . Back surgery  2000  . Abdominal hysterectomy  1978  . Spine surgery      L3 to L5    FAMILY HISTORY Family History  Problem Relation Age of Onset  . Heart disease Mother     CHF and cardiomyopathy  . Thyroid disease Mother   . Hyperlipidemia Mother   . Hyperlipidemia Father   . Cancer Father     liver  .  Lupus Sister   . Hyperlipidemia Sister        ADVANCED DIRECTIVES:    HEALTH MAINTENANCE: Social History  Substance Use Topics  . Smoking status: Former Smoker -- 0.50 packs/day for 25 years    Types: Cigarettes    Quit date: 09/17/1993  . Smokeless tobacco: Never Used  . Alcohol Use: No     Colonoscopy:  PAP:  Bone density:  Lipid panel:  Allergies  Allergen Reactions  . Aspirin Other (See Comments)    GI  bleed  . Statins Other (See Comments)    LEG & ARM CRAMPS  . Codeine Rash    Current Outpatient Prescriptions  Medication Sig Dispense Refill  . albuterol (PROAIR HFA) 108 (90 BASE) MCG/ACT inhaler Inhale 2 puffs into the lungs every 4 (four) hours as needed for wheezing. 1 Inhaler 2  . ALPRAZolam (XANAX) 0.25 MG tablet Take 1 tablet (0.25 mg total) by mouth at bedtime as needed. 30 tablet 2  . cholecalciferol (VITAMIN D) 400 UNITS TABS tablet Take 400 Units by mouth.    . fexofenadine (ALLEGRA) 180 MG tablet Take 180 mg by mouth daily.    Marland Kitchen FLUoxetine (PROZAC) 40 MG capsule TAKE ONE CAPSULE BY MOUTH EVERY DAY 90 capsule 0  . gabapentin (NEURONTIN) 300 MG capsule TAKE ONE CAPSULE BY MOUTH WITH BREAKFAST AND LUNCH AND 2 CAPSULES EVERY NIGHT  AT BEDTIME 360 capsule 3  . hydroxychloroquine (PLAQUENIL) 200 MG tablet Take 200 mg by mouth daily.    Marland Kitchen lactobacillus acidophilus (BACID) TABS tablet Take 1 tablet by mouth daily.    Marland Kitchen levothyroxine (SYNTHROID, LEVOTHROID) 100 MCG tablet TAKE 1 TABLET BY MOUTH EVERY DAY 30 tablet 11  . lisinopril-hydrochlorothiazide (PRINZIDE,ZESTORETIC) 10-12.5 MG per tablet TAKE 1 TABLET BY MOUTH EVERY DAY 30 tablet 6  . Multiple Vitamins-Minerals (SENIOR MULTIVITAMIN PLUS) TABS Take 1 tablet by mouth.      . Omega-3 Fatty Acids (FISH OIL) 1000 MG CAPS Take 1 capsule by mouth daily.    . fluticasone (FLONASE) 50 MCG/ACT nasal spray Place 2 sprays into the nose daily. 16 g 6  . valACYclovir (VALTREX) 1000 MG tablet Take 1 tablet (1,000 mg total)  by mouth 3 (three) times daily. 21 tablet 0   No current facility-administered medications for this visit.    OBJECTIVE: Filed Vitals:   05/09/15 1530  BP: 206/90  Pulse: 84  Temp: 96.2 F (35.7 C)  Resp: 18     Body mass index is 24.07 kg/(m^2).    ECOG FS:0 - Asymptomatic  General: Well-developed, well-nourished, no acute distress. Eyes: Pink conjunctiva, anicteric sclera. Lungs: Clear to auscultation bilaterally. Heart: Regular rate and rhythm. No rubs, murmurs, or gallops. Abdomen: Soft, nontender, nondistended. No organomegaly noted, normoactive bowel sounds. Musculoskeletal: No edema, cyanosis, or clubbing. Neuro: Alert, answering all questions appropriately. Cranial nerves grossly intact. Skin:  Rash on left flank in dermatomal distribution consistent with shingles. Psych: Normal affect.   LAB RESULTS:  Lab Results  Component Value Date   NA 138 01/13/2015   K 4.4 01/13/2015   CL 101 01/13/2015   CO2 25 01/13/2015   GLUCOSE 104* 01/13/2015   BUN 17 01/13/2015   CREATININE 0.62 01/13/2015   CALCIUM 10.2 01/13/2015   PROT 7.1 01/13/2015   ALBUMIN 4.4 01/13/2015   AST 19 01/13/2015   ALT 21 01/13/2015   ALKPHOS 85 01/13/2015   BILITOT 0.4 01/13/2015    Lab Results  Component Value Date   WBC 10.2 05/09/2015   NEUTROABS 6.5 05/09/2015   HGB 15.5 05/09/2015   HCT 45.3 05/09/2015   MCV 84.7 05/09/2015   PLT 266 05/09/2015     STUDIES: No results found.  ASSESSMENT: Polycythemia, resolved.  PLAN:    1. Polycythemia:  Patient's hemoglobin continues to be within normal limits at 15.5. Previously, all of her laboratory work including iron stores, JAK-2 mutation, carboxyhemoglobin and erythropoietin levels were negative or within normal limits. No intervention is needed at this time. Patient does not require Hydrea or phlebotomy.  No further follow-up has been scheduled. Please refer patient back if her hemoglobin begins to trend up again. 2. Shingles:  Patient was given a prescription for Valtrex 1000 mg 3 times a day for 7 days.   Patient expressed understanding and was in agreement with this plan. She also understands that She can call clinic at any time with any questions, concerns, or complaints.    Lloyd Huger, MD   05/13/2015 4:23 PM

## 2015-06-01 ENCOUNTER — Other Ambulatory Visit: Payer: Self-pay

## 2015-06-01 MED ORDER — GABAPENTIN 300 MG PO CAPS: ORAL_CAPSULE | ORAL | Status: DC

## 2015-06-29 ENCOUNTER — Other Ambulatory Visit: Payer: Self-pay | Admitting: Internal Medicine

## 2015-06-29 DIAGNOSIS — F411 Generalized anxiety disorder: Secondary | ICD-10-CM

## 2015-06-29 MED ORDER — ALPRAZOLAM 0.25 MG PO TABS: 0.2500 mg | ORAL_TABLET | Freq: Every evening | ORAL | Status: DC | PRN

## 2015-09-26 ENCOUNTER — Other Ambulatory Visit: Payer: Self-pay | Admitting: Family Medicine

## 2015-09-26 ENCOUNTER — Other Ambulatory Visit: Payer: Self-pay | Admitting: Internal Medicine

## 2015-10-03 ENCOUNTER — Ambulatory Visit: Payer: Commercial Managed Care - HMO | Admitting: Internal Medicine

## 2015-10-04 ENCOUNTER — Other Ambulatory Visit: Payer: Self-pay | Admitting: Internal Medicine

## 2015-10-04 DIAGNOSIS — E034 Atrophy of thyroid (acquired): Secondary | ICD-10-CM

## 2015-10-04 DIAGNOSIS — I10 Essential (primary) hypertension: Secondary | ICD-10-CM

## 2015-10-04 DIAGNOSIS — E0840 Diabetes mellitus due to underlying condition with diabetic neuropathy, unspecified: Secondary | ICD-10-CM

## 2015-10-04 NOTE — Telephone Encounter (Signed)
Please advise, last OV was 01/13/2015?

## 2015-10-05 NOTE — Telephone Encounter (Signed)
Patient is overdue for 6 month DM follow up.  Refill for 30 days only.  OFFICE VISIT NEEDED and fasting lbs  prior to any more refills

## 2015-10-24 ENCOUNTER — Encounter: Payer: Self-pay | Admitting: Internal Medicine

## 2015-10-24 ENCOUNTER — Ambulatory Visit (INDEPENDENT_AMBULATORY_CARE_PROVIDER_SITE_OTHER): Payer: Commercial Managed Care - HMO | Admitting: Internal Medicine

## 2015-10-24 VITALS — BP 122/78 | HR 65 | Temp 97.6°F | Resp 12 | Ht 65.0 in | Wt 140.2 lb

## 2015-10-24 DIAGNOSIS — I10 Essential (primary) hypertension: Secondary | ICD-10-CM | POA: Diagnosis not present

## 2015-10-24 DIAGNOSIS — Z23 Encounter for immunization: Secondary | ICD-10-CM | POA: Diagnosis not present

## 2015-10-24 DIAGNOSIS — E038 Other specified hypothyroidism: Secondary | ICD-10-CM

## 2015-10-24 DIAGNOSIS — J320 Chronic maxillary sinusitis: Secondary | ICD-10-CM | POA: Diagnosis not present

## 2015-10-24 DIAGNOSIS — Z1239 Encounter for other screening for malignant neoplasm of breast: Secondary | ICD-10-CM

## 2015-10-24 DIAGNOSIS — D751 Secondary polycythemia: Secondary | ICD-10-CM

## 2015-10-24 DIAGNOSIS — L97529 Non-pressure chronic ulcer of other part of left foot with unspecified severity: Secondary | ICD-10-CM

## 2015-10-24 DIAGNOSIS — E034 Atrophy of thyroid (acquired): Secondary | ICD-10-CM

## 2015-10-24 DIAGNOSIS — E11621 Type 2 diabetes mellitus with foot ulcer: Secondary | ICD-10-CM | POA: Diagnosis not present

## 2015-10-24 DIAGNOSIS — E0840 Diabetes mellitus due to underlying condition with diabetic neuropathy, unspecified: Secondary | ICD-10-CM

## 2015-10-24 LAB — COMPREHENSIVE METABOLIC PANEL
ALT: 16 U/L (ref 0–35)
AST: 18 U/L (ref 0–37)
Albumin: 4.3 g/dL (ref 3.5–5.2)
Alkaline Phosphatase: 81 U/L (ref 39–117)
BUN: 18 mg/dL (ref 6–23)
CHLORIDE: 98 meq/L (ref 96–112)
CO2: 29 meq/L (ref 19–32)
Calcium: 10.2 mg/dL (ref 8.4–10.5)
Creatinine, Ser: 0.62 mg/dL (ref 0.40–1.20)
GFR: 101.49 mL/min (ref >60.00)
GLUCOSE: 87 mg/dL (ref 70–99)
POTASSIUM: 4.4 meq/L (ref 3.5–5.1)
SODIUM: 137 meq/L (ref 135–145)
Total Bilirubin: 0.4 mg/dL (ref 0.2–1.2)
Total Protein: 7.5 g/dL (ref 6.0–8.3)

## 2015-10-24 LAB — LIPID PANEL
CHOL/HDL RATIO: 6
Cholesterol: 292 mg/dL — ABNORMAL HIGH (ref 0–200)
HDL: 47.2 mg/dL (ref >39.00)
NONHDL: 245.29
TRIGLYCERIDES: 201 mg/dL — AB (ref 0.0–149.0)
VLDL: 40.2 mg/dL — ABNORMAL HIGH (ref 0.0–40.0)

## 2015-10-24 LAB — LDL CHOLESTEROL, DIRECT: Direct LDL: 207 mg/dL

## 2015-10-24 LAB — MICROALBUMIN / CREATININE URINE RATIO
CREATININE, U: 70.7 mg/dL
MICROALB/CREAT RATIO: 1 mg/g (ref 0.0–30.0)
Microalb, Ur: <0.7 mg/dL (ref 0.0–1.9)

## 2015-10-24 LAB — HEMOGLOBIN A1C: HEMOGLOBIN A1C: 5.9 % (ref 4.6–6.5)

## 2015-10-24 MED ORDER — LEVOFLOXACIN 500 MG PO TABS: 500.0000 mg | ORAL_TABLET | Freq: Every day | ORAL | Status: DC

## 2015-10-24 MED ORDER — CLINDAMYCIN HCL 300 MG PO CAPS: 300.0000 mg | ORAL_CAPSULE | Freq: Three times a day (TID) | ORAL | Status: DC

## 2015-10-24 NOTE — Patient Instructions (Signed)
I am treating you for  Chronic bacterial sinusitis and referring you to Dr Richardson Landry for further evaluation of right side   I am prescribing two  antibiotics (levaquin and clindamycin )  To manage the infection and the inflammation in your ear/sinuses.   I also advise use of the following OTC meds to help with your other symptoms.   flush your sinuses twice daily with NeilMed sinus rinse  (do over the sink because if you do it right you will spit out globs of mucus)  Please take a probiotic ( Align, Floraque or Culturelle) OR A GENERIC EQUIVALENT for three weeks since you are taking an  antibiotic to prevent a very serious antibiotic associated infection  Called closttidium dificile colitis that can cause diarrhea, multi organ failure, sepsis and death if not managed.

## 2015-10-24 NOTE — Progress Notes (Signed)
Subjective:  Patient ID: [name] Day, female    DOB: 1945/09/25  Age: 70 y.o. MRN: [id number]  CC: The primary encounter diagnosis was Maxillary sinusitis, unspecified chronicity. Diagnoses of Diabetic ulcer of left foot associated with type 2 diabetes mellitus ([place]), Breast cancer screening, Essential hypertension, Diabetes mellitus due to underlying condition with diabetic neuropathy, without long-term current use of insulin ([place]), Hypothyroidism due to acquired atrophy of thyroid, Encounter for immunization, Need for prophylactic vaccination against Streptococcus pneumoniae (pneumococcus), Chronic maxillary sinusitis, and Polycythemia, secondary were also pertinent to this visit.  HPI [name] Day presents for check on Type 2 DM and other issues. Marland Kitchen  1) THINKS SHE MAY HAVE CHRONIC SINUSITIS AGAIN.  HAS BEEN HAVING Recurrent  nosebleeds Dec 1 .  Stopped her nasal steroid,  Still averaging one/week.   Using aqhaphor  on a q tip and saline spray,  Right side always.  Accompanied by a frontal  headache over the forehead which resolves one the bleeding starts.  Flows are heavy .   Was treated with clindamycin  By Dr Lacinda Axon for chronic sinusitis asscoated with a foul smell inside head.  ENT referal was made.  Saw Dr Richardson Landry    2) unintentional weight loss.  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  P  Patient is following a low glycemic index diet   Fasting sugars have been under less than 125 most of the time and post prandials have been under 160 except on rare occasions.  .  Patient has had an eye exam in the last 12 months and checks feet regularly for signs of infection.  Patient does not walk barefoot outside,  And denies any numbness tingling or burning in feet.   Outpatient Prescriptions Prior to Visit  Medication Sig Dispense Refill  . albuterol  (PROAIR HFA) 108 (90 BASE) MCG/ACT inhaler Inhale 2 puffs into the lungs every 4 (four) hours as needed for wheezing. 1 Inhaler 2  . ALPRAZolam (XANAX) 0.25 MG tablet Take 1 tablet (0.25 mg total) by mouth at bedtime as needed. 30 tablet 2  . cholecalciferol (VITAMIN D) 400 UNITS TABS tablet Take 400 Units by mouth.    . fexofenadine (ALLEGRA) 180 MG tablet Take 180 mg by mouth daily.    Marland Kitchen FLUoxetine (PROZAC) 40 MG capsule TAKE 1 CAPSULE BY MOUTH EVERY DAY. 30 capsule 0  . gabapentin (NEURONTIN) 300 MG capsule TAKE ONE CAPSULE BY MOUTH WITH BREAKFAST AND LUNCH AND 2 CAPSULES EVERY NIGHT AT BEDTIME 360 capsule 3  . hydroxychloroquine (PLAQUENIL) 200 MG tablet Take 200 mg by mouth daily.    Marland Kitchen lactobacillus acidophilus (BACID) TABS tablet Take 1 tablet by mouth daily.    Marland Kitchen levothyroxine (SYNTHROID, LEVOTHROID) 100 MCG tablet TAKE 1 TABLET BY MOUTH EVERY DAY 30 tablet 11  . lisinopril-hydrochlorothiazide (PRINZIDE,ZESTORETIC) 10-12.5 MG tablet TAKE 1 TABLET BY MOUTH EVERY DAY 90 tablet 0  . Multiple Vitamins-Minerals (SENIOR MULTIVITAMIN PLUS) TABS Take 1 tablet by mouth.      . Omega-3 Fatty Acids (FISH OIL) 1000 MG CAPS Take 1 capsule by mouth daily.    . valACYclovir (VALTREX) 1000 MG tablet Take 1 tablet (1,000 mg total) by mouth 3 (three) times daily. 21 tablet 0  . fluticasone (FLONASE) 50 MCG/ACT nasal spray Place 2 sprays into the nose daily. 16 g  6   No facility-administered medications prior to visit.    Review of Systems;  Patient denies headache, fevers, malaise, unintentional weight loss, skin rash, eye pain, sinus congestion and sinus pain, sore throat, dysphagia,  hemoptysis , cough, dyspnea, wheezing, chest pain, palpitations, orthopnea, edema, abdominal pain, nausea, melena, diarrhea, constipation, flank pain, dysuria, hematuria, urinary  Frequency, nocturia, numbness, tingling, seizures,  Focal weakness, Loss of consciousness,  Tremor, insomnia, depression, anxiety, and suicidal  ideation.      Objective:  BP 122/78 mmHg  Pulse 65  Temp(Src) 97.6 F (36.4 C) (Oral)  Resp 12  Ht 5\' 5"  (1.651 m)  Wt 140 lb 4 oz (63.617 kg)  BMI 23.34 kg/m2  SpO2 97%  BP Readings from Last 3 Encounters:  10/24/15 122/78  05/09/15 206/90  04/07/15 148/98    Wt Readings from Last 3 Encounters:  10/24/15 140 lb 4 oz (63.617 kg)  05/09/15 144 lb 10 oz (65.6 kg)  04/07/15 143 lb (64.864 kg)    General appearance: alert, cooperative and appears stated age Ears: normal TM's and external ear canals both ears Throat: lips, mucosa, and tongue normal; teeth and gums normal Neck: no adenopathy, no carotid bruit, supple, symmetrical, trachea midline and thyroid not enlarged, symmetric, no tenderness/mass/nodules Back: symmetric, no curvature. ROM normal. No CVA tenderness. Lungs: clear to auscultation bilaterally Heart: regular rate and rhythm, S1, S2 normal, no murmur, click, rub or gallop Abdomen: soft, non-tender; bowel sounds normal; no masses,  no organomegaly Pulses: 2+ and symmetric Skin: Skin color, texture, turgor normal. No rashes or lesions Lymph nodes: Cervical, supraclavicular, and axillary nodes normal.  Lab Results  Component Value Date   HGBA1C 5.9 10/24/2015   HGBA1C 5.8 01/13/2015   HGBA1C 5.9 03/08/2014    Lab Results  Component Value Date   CREATININE 0.62 10/24/2015   CREATININE 0.62 01/13/2015   CREATININE 0.9 03/08/2014    Lab Results  Component Value Date   WBC 10.2 05/09/2015   HGB 15.5 05/09/2015   HCT 45.3 05/09/2015   PLT 266 05/09/2015   GLUCOSE 87 10/24/2015   CHOL 292* 10/24/2015   TRIG 201.0* 10/24/2015   HDL 47.20 10/24/2015   LDLDIRECT 207.0 10/24/2015   LDLCALC 81 03/08/2014   ALT 16 10/24/2015   AST 18 10/24/2015   NA 137 10/24/2015   K 4.4 10/24/2015   CL 98 10/24/2015   CREATININE 0.62 10/24/2015   BUN 18 10/24/2015   CO2 29 10/24/2015   TSH 1.34 01/13/2015   HGBA1C 5.9 10/24/2015   MICROALBUR <0.7 10/24/2015     No results found.  Assessment & Plan:   Problem List Items Addressed This Visit    Hypertension    Well controlled on current regimen. Renal function stable, no changes today.  Lab Results  Component Value Date   CREATININE 0.62 10/24/2015   Lab Results  Component Value Date   NA 137 10/24/2015   K 4.4 10/24/2015   CL 98 10/24/2015   CO2 29 10/24/2015         Diabetes mellitus with neuropathy ([facility])     Historically well-controlled on diet alone .  hemoglobin A1c has been consistently at or  less than 7.0 . Patient is up-to-date on eye exams and foot exam is  abnormal today due to development of callous.   Patient has no microalbuminuria. Patient is tolerating statin therapy for CAD risk reduction and on ACE/ARB for renal protection and hypertension   Lab Results  Component Value Date  HGBA1C 5.9 10/24/2015   Lab Results  Component Value Date   MICROALBUR <0.7 10/24/2015         Chronic maxillary sinusitis    Recurrent with epistaxis and headache.  Levaquin/clindamycin and referral to ENT      Relevant Medications   levofloxacin (LEVAQUIN) 500 MG tablet   clindamycin (CLEOCIN) 300 MG capsule   Polycythemia, secondary    Resolved,  Workup WAS benign by Oncology.  Will follow hgb SEMI ANNUALLY  Lab Results  Component Value Date   WBC 10.2 05/09/2015   HGB 15.5 05/09/2015   HCT 45.3 05/09/2015   MCV 84.7 05/09/2015   PLT 266 05/09/2015           Hypothyroidism    Other Visit Diagnoses    Maxillary sinusitis, unspecified chronicity    -  Primary    Relevant Medications    levofloxacin (LEVAQUIN) 500 MG tablet    clindamycin (CLEOCIN) 300 MG capsule    Other Relevant Orders    Ambulatory referral to ENT    Diabetic ulcer of left foot associated with type 2 diabetes mellitus ([place])        Relevant Orders    Ambulatory referral to Podiatry    Breast cancer screening        Relevant Orders    MM DIGITAL SCREENING BILATERAL    Encounter for  immunization        Need for prophylactic vaccination against Streptococcus pneumoniae (pneumococcus)        Relevant Orders    Pneumococcal polysaccharide vaccine 23-valent greater than or equal to 2yo subcutaneous/IM (Completed)       I have discontinued Ms. Leseberg fluticasone. I am also having her start on levofloxacin and clindamycin. Additionally, I am having her maintain her SENIOR MULTIVITAMIN PLUS, Fish Oil, fexofenadine, hydroxychloroquine, albuterol, levothyroxine, cholecalciferol, lactobacillus acidophilus, valACYclovir, gabapentin, ALPRAZolam, lisinopril-hydrochlorothiazide, and FLUoxetine.  Meds ordered this encounter  Medications  . levofloxacin (LEVAQUIN) 500 MG tablet    Sig: Take 1 tablet (500 mg total) by mouth daily.    Dispense:  10 tablet    Refill:  0  . clindamycin (CLEOCIN) 300 MG capsule    Sig: Take 1 capsule (300 mg total) by mouth 3 (three) times daily.    Dispense:  30 capsule    Refill:  0    Medications Discontinued During This Encounter  Medication Reason  . fluticasone (FLONASE) 50 MCG/ACT nasal spray     Follow-up: Return in about 3 months (around 01/21/2016).   Crecencio Mc, MD

## 2015-10-24 NOTE — Progress Notes (Signed)
Pre-visit discussion using our clinic review tool. No additional management support is needed unless otherwise documented below in the visit note.  

## 2015-10-25 ENCOUNTER — Encounter: Payer: Self-pay | Admitting: Internal Medicine

## 2015-10-25 NOTE — Addendum Note (Signed)
Addended by: Crecencio Mc on: 10/25/2015 09:45 PM   Modules accepted: Miquel Dunn

## 2015-10-25 NOTE — Assessment & Plan Note (Signed)
Recurrent with epistaxis and headache.  Levaquin/clindamycin and referral to ENT

## 2015-10-25 NOTE — Assessment & Plan Note (Signed)
Resolved,  Workup WAS benign by Oncology.  Will follow hgb SEMI ANNUALLY  Lab Results  Component Value Date   WBC 10.2 05/09/2015   HGB 15.5 05/09/2015   HCT 45.3 05/09/2015   MCV 84.7 05/09/2015   PLT 266 05/09/2015

## 2015-10-25 NOTE — Assessment & Plan Note (Signed)
Historically well-controlled on diet alone .  hemoglobin A1c has been consistently at or  less than 7.0 . Patient is up-to-date on eye exams and foot exam is  abnormal today due to development of callous.   Patient has no microalbuminuria. Patient is tolerating statin therapy for CAD risk reduction and on ACE/ARB for renal protection and hypertension   Lab Results  Component Value Date   HGBA1C 5.9 10/24/2015   Lab Results  Component Value Date   MICROALBUR <0.7 10/24/2015

## 2015-10-25 NOTE — Assessment & Plan Note (Signed)
Well controlled on current regimen. Renal function stable, no changes today.  Lab Results  Component Value Date   CREATININE 0.62 10/24/2015   Lab Results  Component Value Date   NA 137 10/24/2015   K 4.4 10/24/2015   CL 98 10/24/2015   CO2 29 10/24/2015

## 2015-10-26 LAB — T4 AND TSH
T4, Total: 12.3 ug/dL — ABNORMAL HIGH (ref 4.5–12.0)
TSH: 1.19 u[IU]/mL (ref 0.450–4.500)

## 2015-10-31 ENCOUNTER — Other Ambulatory Visit: Payer: Self-pay

## 2015-10-31 ENCOUNTER — Telehealth: Payer: Self-pay | Admitting: Internal Medicine

## 2015-10-31 MED ORDER — LISINOPRIL-HYDROCHLOROTHIAZIDE 10-12.5 MG PO TABS: 1.0000 | ORAL_TABLET | Freq: Every day | ORAL | Status: DC

## 2015-10-31 NOTE — Telephone Encounter (Signed)
Spoke with patient, it was resent per her request.

## 2015-10-31 NOTE — Telephone Encounter (Signed)
Pt would like her lisinopril-hydrochlorothiazide (PRINZIDE,ZESTORETIC) 10-12.5 MG tablet refilled. She says her pharmacy told her that Dr. Derrel Nip denied the request. Please call pt at home.

## 2015-11-01 ENCOUNTER — Other Ambulatory Visit: Payer: Self-pay | Admitting: Internal Medicine

## 2015-11-07 DIAGNOSIS — R040 Epistaxis: Secondary | ICD-10-CM | POA: Diagnosis not present

## 2015-11-07 DIAGNOSIS — J301 Allergic rhinitis due to pollen: Secondary | ICD-10-CM | POA: Diagnosis not present

## 2015-11-07 DIAGNOSIS — J322 Chronic ethmoidal sinusitis: Secondary | ICD-10-CM | POA: Diagnosis not present

## 2015-11-07 DIAGNOSIS — J320 Chronic maxillary sinusitis: Secondary | ICD-10-CM | POA: Diagnosis not present

## 2015-11-07 DIAGNOSIS — J321 Chronic frontal sinusitis: Secondary | ICD-10-CM | POA: Diagnosis not present

## 2015-12-05 DIAGNOSIS — J320 Chronic maxillary sinusitis: Secondary | ICD-10-CM | POA: Diagnosis not present

## 2015-12-05 DIAGNOSIS — J321 Chronic frontal sinusitis: Secondary | ICD-10-CM | POA: Diagnosis not present

## 2015-12-05 DIAGNOSIS — J342 Deviated nasal septum: Secondary | ICD-10-CM | POA: Diagnosis not present

## 2015-12-05 DIAGNOSIS — J322 Chronic ethmoidal sinusitis: Secondary | ICD-10-CM | POA: Diagnosis not present

## 2015-12-27 ENCOUNTER — Encounter: Payer: Self-pay | Admitting: *Deleted

## 2016-01-02 ENCOUNTER — Ambulatory Visit (INDEPENDENT_AMBULATORY_CARE_PROVIDER_SITE_OTHER): Payer: Commercial Managed Care - HMO | Admitting: Family Medicine

## 2016-01-02 ENCOUNTER — Encounter: Payer: Self-pay | Admitting: Family Medicine

## 2016-01-02 VITALS — BP 124/82 | HR 69 | Temp 99.5°F | Ht 65.0 in | Wt 143.0 lb

## 2016-01-02 DIAGNOSIS — J441 Chronic obstructive pulmonary disease with (acute) exacerbation: Secondary | ICD-10-CM

## 2016-01-02 MED ORDER — PREDNISONE 50 MG PO TABS: ORAL_TABLET | ORAL | Status: DC

## 2016-01-02 MED ORDER — HYDROCOD POLST-CPM POLST ER 10-8 MG/5ML PO SUER: 5.0000 mL | Freq: Two times a day (BID) | ORAL | Status: DC | PRN

## 2016-01-02 MED ORDER — DOXYCYCLINE HYCLATE 100 MG PO TABS: 100.0000 mg | ORAL_TABLET | Freq: Two times a day (BID) | ORAL | Status: DC

## 2016-01-02 NOTE — Progress Notes (Signed)
Pre visit review using our clinic review tool, if applicable. No additional management support is needed unless otherwise documented below in the visit note. 

## 2016-01-02 NOTE — Assessment & Plan Note (Signed)
Viral respiratory infection in setting of COPD; has led to exacerbation. Treating with Doxy, prednisone and tussionex.

## 2016-01-02 NOTE — Patient Instructions (Signed)
Take the medications as prescribed.  Follow up as needed.   Call if you fail to improve or worsen.  Take care  Dr. Lacinda Axon

## 2016-01-02 NOTE — Progress Notes (Signed)
Subjective:  Patient ID: [name] Day, female    DOB: March 06, 1946  Age: 70 y.o. MRN: [id number]  CC: Cough, congestion  HPI:  70 year old female with past medical history of COPD presents with the above complaints.  Patient states that on Thursday she developed sore throat. She subsequently developed body aches and cough. She states that she went to have a scheduled surgery and it was declined as she was currently ill. They told her to follow-up with her primary. She presents today with continued complaints of cough. She also reports congestion. No fever. Cough is severe. Patient reports a history of COPD although it is not documented in the chart. No other complaints this time. No known exacerbating or relieving factors.  Social Hx   Social History   Social History  . Marital Status: Married    Spouse Name: N/A  . Number of Children: N/A  . Years of Education: N/A   Occupational History  . 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 Smoking status: Former Smoker -- 0.50 packs/day for 25 years    Types: Cigarettes    Quit date: 09/17/1993  . Smokeless tobacco: Never Used  . Alcohol Use: No  . Drug Use: No  . Sexual Activity: Not Currently   Other Topics Concern  . None   Social History Narrative   Review of Systems  Constitutional: Negative for fever.  HENT: Positive for congestion.   Respiratory: Positive for cough.    Objective:  BP 124/82 mmHg  Pulse 69  Temp(Src) 99.5 F (37.5 C) (Oral)  Ht 5\' 5"  (1.651 m)  Wt 143 lb (64.864 kg)  BMI 23.80 kg/m2  SpO2 96%  BP/Weight 01/02/2016 [id number]  Systolic BP [id number] - [id number]  Diastolic BP 82 - 78  Wt. (Lbs) 143 140 140.25  BMI 23.8 - 23.34   Physical Exam  Constitutional: She is oriented to person, place, and time. She appears well-developed. No distress.  HENT:  Mouth/Throat: Oropharynx is clear and moist.  Eyes: Conjunctivae are normal.  Neck: Neck supple.    Cardiovascular: Normal rate and regular rhythm.   Pulmonary/Chest: Effort normal. She has no wheezes. She has no rales.  Lymphadenopathy:    She has no cervical adenopathy.  Neurological: She is alert and oriented to person, place, and time.  Psychiatric: She has a normal mood and affect.  Vitals reviewed.  Lab Results  Component Value Date   WBC 10.2 05/09/2015   HGB 15.5 05/09/2015   HCT 45.3 05/09/2015   PLT 266 05/09/2015   GLUCOSE 87 10/24/2015   CHOL 292* 10/24/2015   TRIG 201.0* 10/24/2015   HDL 47.20 10/24/2015   LDLDIRECT 207.0 10/24/2015   LDLCALC 81 03/08/2014   ALT 16 10/24/2015   AST 18 10/24/2015   NA 137 10/24/2015   K 4.4 10/24/2015   CL 98 10/24/2015   CREATININE 0.62 10/24/2015   BUN 18 10/24/2015   CO2 29 10/24/2015   TSH 1.190 10/24/2015   HGBA1C 5.9 10/24/2015   MICROALBUR <0.7 10/24/2015   Assessment & Plan:   Problem List Items Addressed This Visit    COPD exacerbation ([place]) - Primary    Viral respiratory infection in setting of COPD; has led to exacerbation. Treating with Doxy, prednisone and tussionex.      Relevant Medications   predniSONE (DELTASONE) 50 MG tablet   chlorpheniramine-HYDROcodone (TUSSIONEX PENNKINETIC ER) 10-8 MG/5ML SUER      Meds ordered this  encounter  Medications  . doxycycline (VIBRA-TABS) 100 MG tablet    Sig: Take 1 tablet (100 mg total) by mouth 2 (two) times daily.    Dispense:  14 tablet    Refill:  0  . predniSONE (DELTASONE) 50 MG tablet    Sig: 1 tablet daily x 5 days.    Dispense:  5 tablet    Refill:  0  . chlorpheniramine-HYDROcodone (TUSSIONEX PENNKINETIC ER) 10-8 MG/5ML SUER    Sig: Take 5 mLs by mouth every 12 (twelve) hours as needed.    Dispense:  115 mL    Refill:  0   Follow-up: PRN  [place]

## 2016-01-23 ENCOUNTER — Ambulatory Visit: Payer: Commercial Managed Care - HMO | Admitting: Internal Medicine

## 2016-01-23 NOTE — Discharge Instructions (Signed)
[facility] °[facility] °ENDOSCOPIC SINUS SURGERY °[facility] ° °What is Functional Endoscopic Sinus Surgery? ° The Surgery involves making the natural openings of the sinuses larger by removing the bony partitions that separate the sinuses from the nasal cavity.  The natural sinus lining is preserved as much as possible to allow the sinuses to resume normal function after the surgery.  In some patients nasal polyps (excessively swollen lining of the sinuses) may be removed to relieve obstruction of the sinus openings.  The surgery is performed through the nose using lighted scopes, which eliminates the need for incisions on the face.  A septoplasty is a different procedure which is sometimes performed with sinus surgery.  It involves straightening the boy partition that separates the two sides of your nose.  A crooked or deviated septum may need repair if is obstructing the sinuses or nasal airflow.  Turbinate reduction is also often performed during sinus surgery.  The turbinates are bony proturberances from the side walls of the nose which swell and can obstruct the nose in patients with sinus and allergy problems.  Their size can be surgically reduced to help relieve nasal obstruction. ° °What Can Sinus Surgery Do For Me? ° Sinus surgery can reduce the frequency of sinus infections requiring antibiotic treatment.  This can provide improvement in nasal congestion, post-nasal drainage, facial pressure and nasal obstruction.  Surgery will NOT prevent you from ever having an infection again, so it usually only for patients who get infections 4 or more times yearly requiring antibiotics, or for infections that do not clear with antibiotics.  It will not cure nasal allergies, so patients with allergies may still require medication to treat their allergies after surgery. Surgery may improve headaches related to sinusitis, however, some people will continue to  require medication to control sinus headaches related to allergies.  Surgery will do nothing for other forms of headache (migraine, tension or cluster). ° °What Are the Risks of Endoscopic Sinus Surgery? ° Current techniques allow surgery to be performed safely with little risk, however, there are rare complications that patients should be aware of.  Because the sinuses are located around the eyes, there is risk of eye injury, including blindness, though again, this would be quite rare. This is usually a result of bleeding behind the eye during surgery, which puts the vision oat risk, though there are treatments to protect the vision and prevent permanent disrupted by surgery causing a leak of the spinal fluid that surrounds the brain.  More serious complications would include bleeding inside the brain cavity or damage to the brain.  Again, all of these complications are uncommon, and spinal fluid leaks can be safely managed surgically if they occur.  The most common complication of sinus surgery is bleeding from the nose, which may require packing or cauterization of the nose.  Continued sinus have polyps may experience recurrence of the polyps requiring revision surgery.  Alterations of sense of smell or injury to the tear ducts are also rare complications.  ° °What is the Surgery Like, and what is the Recovery? ° The Surgery usually takes a couple of hours to perform, and is usually performed under a general anesthetic (completely asleep).  Patients are usually discharged home after a couple of hours.  Sometimes during surgery it is necessary to pack the nose to control bleeding, and the packing is left in place for 24 - 48 hours, and removed by your surgeon.    If a septoplasty was performed during the procedure, there is often a splint placed which must be removed after 5-7 days.   °Discomfort: Pain is usually mild to moderate, and can be controlled by prescription pain medication or acetaminophen (Tylenol).   Aspirin, Ibuprofen (Advil, Motrin), or Naprosyn (Aleve) should be avoided, as they can cause increased bleeding.  Most patients feel sinus pressure like they have a bad head cold for several days.  Sleeping with your head elevated can help reduce swelling and facial pressure, as can ice packs over the face.  A humidifier may be helpful to keep the mucous and blood from drying in the nose.  ° °Diet: There are no specific diet restrictions, however, you should generally start with clear liquids and a light diet of bland foods because the anesthetic can cause some nausea.  Advance your diet depending on how your stomach feels.  Taking your pain medication with food will often help reduce stomach upset which pain medications can cause. ° °Nasal Saline Irrigation: It is important to remove blood clots and dried mucous from the nose as it is healing.  This is done by having you irrigate the nose at least 3 - 4 times daily with a salt water solution.  We recommend using NeilMed Sinus Rinse (available at the drug store).  Fill the squeeze bottle with the solution, bend over a sink, and insert the tip of the squeeze bottle into the nose ½ of an inch.  Point the tip of the squeeze bottle towards the inside corner of the eye on the same side your irrigating.  Squeeze the bottle and gently irrigate the nose.  If you bend forward as you do this, most of the fluid will flow back out of the nose, instead of down your throat.   The solution should be warm, near body temperature, when you irrigate.   Each time you irrigate, you should use a full squeeze bottle.  ° °Note that if you are instructed to use Nasal Steroid Sprays at any time after your surgery, irrigate with saline BEFORE using the steroid spray, so you do not wash it all out of the nose. °Another product, Nasal Saline Gel (such as AYR Nasal Saline Gel) can be applied in each nostril 3 - 4 times daily to moisture the nose and reduce scabbing or crusting. ° °Bleeding:   Bloody drainage from the nose can be expected for several days, and patients are instructed to irrigate their nose frequently with salt water to help remove mucous and blood clots.  The drainage may be dark red or brown, though some fresh blood may be seen intermittently, especially after irrigation.  Do not blow you nose, as bleeding may occur. If you must sneeze, keep your mouth open to allow air to escape through your mouth. ° °If heavy bleeding occurs: Irrigate the nose with saline to rinse out clots, then spray the nose 3 - 4 times with Afrin Nasal Decongestant Spray.  The spray will constrict the blood vessels to slow bleeding.  Pinch the lower half of your nose shut to apply pressure, and lay down with your head elevated.  Ice packs over the nose may help as well. If bleeding persists despite these measures, you should notify your doctor.  Do not use the Afrin routinely to control nasal congestion after surgery, as it can result in worsening congestion and may affect healing.  ° ° ° °Activity: Return to work varies among patients. Most patients will be   out of work at least 5 - 7 days to recover.  Patient may return to work after they are off of narcotic pain medication, and feeling well enough to perform the functions of their job.  Patients must avoid heavy lifting (over 10 pounds) or strenuous physical for 2 weeks after surgery, so your employer may need to assign you to light duty, or keep you out of work longer if light duty is not possible.  NOTE: you should not drive, operate dangerous machinery, do any mentally demanding tasks or make any important legal or financial decisions while on narcotic pain medication and recovering from the general anesthetic.  °  °Call Your Doctor Immediately if You Have Any of the Following: °1. Bleeding that you cannot control with the above measures °2. Loss of vision, double vision, bulging of the eye or black eyes. °3. Fever over 101 degrees °4. Neck stiffness with  severe headache, fever, nausea and change in mental state. °You are always encourage to call anytime with concerns, however, please call with requests for pain medication refills during office hours. ° °Office Endoscopy: During follow-up visits your doctor will remove any packing or splints that may have been placed and evaluate and clean your sinuses endoscopically.  Topical anesthetic will be used to make this as comfortable as possible, though you may want to take your pain medication prior to the visit.  How often this will need to be done varies from patient to patient.  After complete recovery from the surgery, you may need follow-up endoscopy from time to time, particularly if there is concern of recurrent infection or nasal polyps. ° °General Anesthesia, Adult, Care After °Refer to this sheet in the next few weeks. These instructions provide you with information on caring for yourself after your procedure. Your health care provider may also give you more specific instructions. Your treatment has been planned according to current medical practices, but problems sometimes occur. Call your health care provider if you have any problems or questions after your procedure. °WHAT TO EXPECT AFTER THE PROCEDURE °After the procedure, it is typical to experience: °· Sleepiness. °· Nausea and vomiting. °HOME CARE INSTRUCTIONS °· For the first 24 hours after general anesthesia: °¨ Have a responsible person with you. °¨ Do not drive a car. If you are alone, do not take public transportation. °¨ Do not drink alcohol. °¨ Do not take medicine that has not been prescribed by your health care provider. °¨ Do not sign important papers or make important decisions. °¨ You may resume a normal diet and activities as directed by your health care provider. °· Change bandages (dressings) as directed. °· If you have questions or problems that seem related to general anesthesia, call the hospital and ask for the anesthetist or  anesthesiologist on call. °SEEK MEDICAL CARE IF: °· You have nausea and vomiting that continue the day after anesthesia. °· You develop a rash. °SEEK IMMEDIATE MEDICAL CARE IF:  °· You have difficulty breathing. °· You have chest pain. °· You have any allergic problems. °  °This information is not intended to replace advice given to you by your health care provider. Make sure you discuss any questions you have with your health care provider. °  °Document Released: 12/10/2000 Document Revised: 09/24/2014 Document Reviewed: 01/02/2012 °Elsevier Interactive Patient Education ©2016 [employer] ° °

## 2016-01-24 ENCOUNTER — Ambulatory Visit: Payer: Commercial Managed Care - HMO | Admitting: Student in an Organized Health Care Education/Training Program

## 2016-01-24 ENCOUNTER — Ambulatory Visit
Admission: RE | Admit: 2016-01-24 | Discharge: 2016-01-24 | Disposition: A | Discharge status: Home / Self Care | Payer: Commercial Managed Care - HMO | Source: Ambulatory Visit | Attending: Otolaryngology | Admitting: Otolaryngology

## 2016-01-24 ENCOUNTER — Other Ambulatory Visit: Payer: Self-pay | Admitting: Internal Medicine

## 2016-01-24 ENCOUNTER — Encounter
Admission: RE | Disposition: A | Discharge status: Home / Self Care | Payer: Self-pay | Source: Ambulatory Visit | Attending: Otolaryngology

## 2016-01-24 DIAGNOSIS — J322 Chronic ethmoidal sinusitis: Secondary | ICD-10-CM | POA: Insufficient documentation

## 2016-01-24 DIAGNOSIS — J329 Chronic sinusitis, unspecified: Secondary | ICD-10-CM | POA: Diagnosis not present

## 2016-01-24 DIAGNOSIS — Z8349 Family history of other endocrine, nutritional and metabolic diseases: Secondary | ICD-10-CM | POA: Insufficient documentation

## 2016-01-24 DIAGNOSIS — I10 Essential (primary) hypertension: Secondary | ICD-10-CM | POA: Insufficient documentation

## 2016-01-24 DIAGNOSIS — J342 Deviated nasal septum: Secondary | ICD-10-CM | POA: Insufficient documentation

## 2016-01-24 DIAGNOSIS — J320 Chronic maxillary sinusitis: Secondary | ICD-10-CM | POA: Insufficient documentation

## 2016-01-24 DIAGNOSIS — Z87891 Personal history of nicotine dependence: Secondary | ICD-10-CM | POA: Diagnosis not present

## 2016-01-24 DIAGNOSIS — Z8249 Family history of ischemic heart disease and other diseases of the circulatory system: Secondary | ICD-10-CM | POA: Insufficient documentation

## 2016-01-24 DIAGNOSIS — Z886 Allergy status to analgesic agent status: Secondary | ICD-10-CM | POA: Diagnosis not present

## 2016-01-24 DIAGNOSIS — J321 Chronic frontal sinusitis: Secondary | ICD-10-CM | POA: Insufficient documentation

## 2016-01-24 DIAGNOSIS — M810 Age-related osteoporosis without current pathological fracture: Secondary | ICD-10-CM | POA: Diagnosis not present

## 2016-01-24 DIAGNOSIS — E785 Hyperlipidemia, unspecified: Secondary | ICD-10-CM | POA: Insufficient documentation

## 2016-01-24 DIAGNOSIS — Z79899 Other long term (current) drug therapy: Secondary | ICD-10-CM | POA: Insufficient documentation

## 2016-01-24 DIAGNOSIS — Z981 Arthrodesis status: Secondary | ICD-10-CM | POA: Insufficient documentation

## 2016-01-24 DIAGNOSIS — Z885 Allergy status to narcotic agent status: Secondary | ICD-10-CM | POA: Insufficient documentation

## 2016-01-24 DIAGNOSIS — M1990 Unspecified osteoarthritis, unspecified site: Secondary | ICD-10-CM | POA: Insufficient documentation

## 2016-01-24 DIAGNOSIS — J324 Chronic pansinusitis: Secondary | ICD-10-CM | POA: Diagnosis not present

## 2016-01-24 DIAGNOSIS — Z7951 Long term (current) use of inhaled steroids: Secondary | ICD-10-CM | POA: Insufficient documentation

## 2016-01-24 DIAGNOSIS — E119 Type 2 diabetes mellitus without complications: Secondary | ICD-10-CM | POA: Diagnosis not present

## 2016-01-24 DIAGNOSIS — J3489 Other specified disorders of nose and nasal sinuses: Secondary | ICD-10-CM | POA: Diagnosis not present

## 2016-01-24 HISTORY — PX: SEPTOPLASTY: SHX2393

## 2016-01-24 HISTORY — DX: Unspecified osteoarthritis, unspecified site: M19.90

## 2016-01-24 HISTORY — PX: MAXILLARY ANTROSTOMY: SHX2003

## 2016-01-24 HISTORY — PX: FRONTAL SINUS EXPLORATION: SHX6591

## 2016-01-24 HISTORY — DX: Headache, unspecified: R51.9

## 2016-01-24 HISTORY — PX: ETHMOIDECTOMY: SHX5197

## 2016-01-24 HISTORY — PX: IMAGE GUIDED SINUS SURGERY: SHX6570

## 2016-01-24 HISTORY — DX: Headache: R51

## 2016-01-24 SURGERY — SINUS SURGERY, WITH IMAGING GUIDANCE
Anesthesia: General | Site: Nose | Wound class: Clean Contaminated

## 2016-01-24 MED ORDER — LACTATED RINGERS IV SOLN
INTRAVENOUS | Status: DC
  Administered 2016-01-24 (×2): via INTRAVENOUS

## 2016-01-24 MED ORDER — DEXAMETHASONE SODIUM PHOSPHATE 4 MG/ML IJ SOLN
INTRAMUSCULAR | Status: DC | PRN
  Administered 2016-01-24: 10 mg via INTRAVENOUS

## 2016-01-24 MED ORDER — MIDAZOLAM HCL 5 MG/5ML IJ SOLN
INTRAMUSCULAR | Status: DC | PRN
  Administered 2016-01-24: 2 mg via INTRAVENOUS

## 2016-01-24 MED ORDER — ACETAMINOPHEN 10 MG/ML IV SOLN
1000.0000 mg | Freq: Once | INTRAVENOUS | Status: AC
  Administered 2016-01-24: 1000 mg via INTRAVENOUS

## 2016-01-24 MED ORDER — LIDOCAINE-EPINEPHRINE 1 %-1:100000 IJ SOLN
INTRAMUSCULAR | Status: DC | PRN
  Administered 2016-01-24: 14 mL

## 2016-01-24 MED ORDER — SUCCINYLCHOLINE CHLORIDE 20 MG/ML IJ SOLN: INTRAMUSCULAR | Status: DC | PRN

## 2016-01-24 MED ORDER — HYDROMORPHONE HCL 1 MG/ML IJ SOLN: 0.2500 mg | INTRAMUSCULAR | Status: DC | PRN

## 2016-01-24 MED ORDER — EPHEDRINE SULFATE 50 MG/ML IJ SOLN
INTRAMUSCULAR | Status: DC | PRN
  Administered 2016-01-24: 10 mg via INTRAVENOUS
  Administered 2016-01-24 (×2): 5 mg via INTRAVENOUS

## 2016-01-24 MED ORDER — OXYCODONE HCL 5 MG PO TABS: 5.0000 mg | ORAL_TABLET | Freq: Once | ORAL | Status: DC | PRN

## 2016-01-24 MED ORDER — LABETALOL HCL 5 MG/ML IV SOLN
5.0000 mg | INTRAVENOUS | Status: DC | PRN
  Administered 2016-01-24 (×2): 5 mg via INTRAVENOUS

## 2016-01-24 MED ORDER — MEPERIDINE HCL 25 MG/ML IJ SOLN: 6.2500 mg | INTRAMUSCULAR | Status: DC | PRN

## 2016-01-24 MED ORDER — PROMETHAZINE HCL 25 MG/ML IJ SOLN: 6.2500 mg | INTRAMUSCULAR | Status: DC | PRN

## 2016-01-24 MED ORDER — HYDROCODONE-ACETAMINOPHEN 5-325 MG PO TABS: 1.0000 | ORAL_TABLET | ORAL | Status: DC | PRN

## 2016-01-24 MED ORDER — GLYCOPYRROLATE 0.2 MG/ML IJ SOLN
INTRAMUSCULAR | Status: DC | PRN
  Administered 2016-01-24: 0.1 mg via INTRAVENOUS

## 2016-01-24 MED ORDER — FENTANYL CITRATE (PF) 100 MCG/2ML IJ SOLN
INTRAMUSCULAR | Status: DC | PRN
  Administered 2016-01-24: 100 ug via INTRAVENOUS
  Administered 2016-01-24 (×2): 25 ug via INTRAVENOUS

## 2016-01-24 MED ORDER — ONDANSETRON HCL 4 MG/2ML IJ SOLN
INTRAMUSCULAR | Status: DC | PRN
  Administered 2016-01-24: 4 mg via INTRAVENOUS

## 2016-01-24 MED ORDER — OXYCODONE HCL 5 MG/5ML PO SOLN: 5.0000 mg | Freq: Once | ORAL | Status: DC | PRN

## 2016-01-24 MED ORDER — LIDOCAINE HCL (CARDIAC) 20 MG/ML IV SOLN
INTRAVENOUS | Status: DC | PRN
  Administered 2016-01-24: 40 mg via INTRAVENOUS

## 2016-01-24 MED ORDER — PROPOFOL 10 MG/ML IV BOLUS
INTRAVENOUS | Status: DC | PRN
  Administered 2016-01-24: 130 mg via INTRAVENOUS

## 2016-01-24 MED ORDER — ROCURONIUM BROMIDE 100 MG/10ML IV SOLN
INTRAVENOUS | Status: DC | PRN
  Administered 2016-01-24: 25 mg via INTRAVENOUS

## 2016-01-24 MED ORDER — CEFDINIR 300 MG PO CAPS: 300.0000 mg | ORAL_CAPSULE | Freq: Two times a day (BID) | ORAL | Status: DC

## 2016-01-24 MED ORDER — OXYMETAZOLINE HCL 0.05 % NA SOLN
NASAL | Status: DC | PRN
  Administered 2016-01-24: 1 via TOPICAL

## 2016-01-24 MED ORDER — SCOPOLAMINE 1 MG/3DAYS TD PT72
1.0000 | MEDICATED_PATCH | TRANSDERMAL | Status: DC
  Administered 2016-01-24: 1.5 mg via TRANSDERMAL

## 2016-01-24 SURGICAL SUPPLY — 36 items
BALLOON SINUPLASTY SYSTEM (BALLOONS) IMPLANT
BATTERY INSTRU NAVIGATION (MISCELLANEOUS) ×16 IMPLANT
BLADE IRRIGATOR 40D CVD (IRRIGATION / IRRIGATOR) IMPLANT
BTRY SRG DRVR LF (MISCELLANEOUS) ×8
CANISTER SUCT 1200ML W/VALVE (MISCELLANEOUS) ×4 IMPLANT
COAG SUCT 10F 3.5MM HAND CTRL (MISCELLANEOUS) ×4 IMPLANT
DEVICE INFLATION SEID (MISCELLANEOUS) IMPLANT
DRAPE HEAD BAR (DRAPES) ×4 IMPLANT
DRESSING NASL FOAM PST OP SINU (MISCELLANEOUS) IMPLANT
DRSG NASAL 4CM NASOPORE (MISCELLANEOUS) ×4 IMPLANT
DRSG NASAL FOAM POST OP SINU (MISCELLANEOUS)
GLOVE BIO SURGEON STRL SZ7.5 (GLOVE) ×8 IMPLANT
GLOVE EXAM LX STRL 7.5 (GLOVE) ×8 IMPLANT
IRRIGATOR 4MM STR (IRRIGATION / IRRIGATOR) IMPLANT
IV NS 500ML (IV SOLUTION) ×4
IV NS 500ML BAXH (IV SOLUTION) ×2 IMPLANT
KIT ROOM TURNOVER OR (KITS) ×4 IMPLANT
NAVIGATION MASK REG  ST (MISCELLANEOUS) ×4 IMPLANT
NEEDLE HYPO 25GX1X1/2 BEV (NEEDLE) ×4 IMPLANT
NS IRRIG 500ML POUR BTL (IV SOLUTION) ×4 IMPLANT
PACK DRAPE NASAL/ENT (PACKS) ×4 IMPLANT
PACKING NASAL EPIS 4X2.4 XEROG (MISCELLANEOUS) IMPLANT
PAD GROUND ADULT SPLIT (MISCELLANEOUS) ×4 IMPLANT
PATTIES SURGICAL .5 X3 (DISPOSABLE) ×8 IMPLANT
SET HANDPIECE IRR DIEGO (MISCELLANEOUS) IMPLANT
SOL ANTI-FOG 6CC FOG-OUT (MISCELLANEOUS) ×2 IMPLANT
SOL FOG-OUT ANTI-FOG 6CC (MISCELLANEOUS) ×2
SPLINT NASAL SEPTAL PRE-CUT (MISCELLANEOUS) ×4 IMPLANT
SUT CHROMIC 4 0 RB 1X27 (SUTURE) ×4 IMPLANT
SUT ETHILON 4-0 (SUTURE) ×4
SUT ETHILON 4-0 FS2 18XMFL BLK (SUTURE) ×2
SUT PLAIN GUT 4-0 (SUTURE) ×4 IMPLANT
SUTURE ETHLN 4-0 FS2 18XMF BLK (SUTURE) ×2 IMPLANT
SYRINGE 10CC LL (SYRINGE) ×4 IMPLANT
TOWEL OR 17X26 4PK STRL BLUE (TOWEL DISPOSABLE) ×4 IMPLANT
WATER STERILE IRR 500ML POUR (IV SOLUTION) IMPLANT

## 2016-01-24 NOTE — Op Note (Signed)
01/24/2016  11:07 AM    [name] Day  [id number]   Pre-Op Diagnosis:  DEVIATED SEPTUM CHRONIC SINUSITIS  Post-op Diagnosis: deviated septum, chronic sinusitis  Procedure:  1)  Image Guided Sinus Surgery,   2)  Bilateral Endoscopic Maxillary Antrostomy with Tissue Removal   3)  Bilateral Frontal Sinusotomy   4)  Bilateral total ethmoidectomy   5)  Nasal Septoplasty    Surgeon:  Riley Nearing  Anesthesia:  General endotracheal  EBL:  75 cc  Complications:  None  Findings: Polypoid mucosal thickening in the ethmoid sinuses and frontal recess bilaterally with frank purulence in the right maxillary sinus, which was cultured. The septum was deviated to the right  Procedure: After the patient was identified in holding and the benefits of the procedure were reviewed as well as the consent and risks, the patient was taken to the operating room and with the patient in a comfortable supine position,  general orotracheal anesthesia was induced without difficulty.  A proper time-out was performed.  The Stryker image guidance system was set up and calibrated in the normal fashion and felt to be acceptable.  Next 1% Xylocaine with 1:100,000 epinephrine was infiltrated into the inferior turbinates, septum, and anterior middle turbinates bilaterally.  Several minutes were allowed for this to take effect.  Cottoniod pledgets soaked in Afrin were placed into both nasal cavities and left while the patient was prepped and draped in the standard fashion. The image guided suction was calibrated and used to inspect known points in the nasal cavity to assess accuracy of the image guided system. Accuracy was felt to be excellent.   The patient was then prepped and draped in the usual sterile fashion. Beginning on the left hand side a hemitransfixion incision was then created on the leading edge of the septum on the left.  A subperichondrial plane was elevated posteriorly on the left and taken back to  the perpendicular plate of the ethmoid where subperiosteal plane was elevated posteriorly on the left. A large septal spur was identified on the right hand side.  An inferior rim of redundant septal cartilage was removed from where it deviated over the maxillary crest. The perpendicular plate of the ethmoid was separated from the quadrangular cartilage, and a subperiosteal plane elevated on the right of the bony septum. The large septal spur was removed, dividing the septal bone superiorly and inferiorly with Knight scissors.  The septum was then replaced in the midline. Reinspection through each nostril showed excellent reduction of the septal deformity. A left posterior inferior fenestration was then created to allow hematoma drainage.  The septal incision was closed with 4-0 chromic gut suture. A 4-0 plain gut suture was used to reapproximate the septal flaps to the underlying cartilage, utilizing a running, quilting type stitch.   Next, the left middle turbinate, which was noted to have a concha bullosa, was incised with the sickle knife. Curved endoscopic scissors were then used to resect the lateral aspect of the concha bullosa, which widely opened the middle meatus. The uncinate process then resected with through-cutting forceps as well as the microdebrider. In this fashion the uncinate was completely removed along with soft tissue and bone of the medial wall of the maxillary sinus to create a large patent maxillary antrostomy. The left maxillary sinus was suctioned to clear secretions.   Next the left anterior ethmoid sinuses were dissected beginning inferomedially, entering the ethmoid bulla. Thru cut forceps were used to open the anterior ethmoids. The  microdebrider was used as needed to trim loose mucosal edges.  Next the basal lamella was entered and, working back in a sequential fashion through the ethmoid air cells, the ethmoid sinuses were dissected to the posterior ethmoid sinuses, utilizing  the image guided suction the whole time to reassess the anatomy frequently. Thru cutting forceps were used for this dissection. Some polypoid areas were noted and small polyps resected. Care was taken to avoid injury to the lamina papyracea laterally and the skull base superiorly.   Dissection proceeded anteriorly and superiorly into the left frontal recess which was dissected utilizing a 30 and then a 70 scope and frontal curved instruments. The curved image guided suction was used during this dissection to frequently reassess the anatomy on the CT scan. The frontal recess was dissected , opening the Agger nasi cell, until a suction could be passed up into the region of the frontal sinus.   Attention was then turned to the right side where the same procedure was performed, opening the maxillary sinus, ethmoid cavities, and the frontal recess in the same fashion as described above. On this side there was frank purulence in the right maxillary sinus, and this was cultured followed by suctioning the sinus clear and irrigating it with sterile saline.  The nose was suctioned and inspected. Stammberger absorbable sinus packing was then placed in the ethmoid cavities bilaterally. There was felt to be no need for septal splints, as adequate re-apposition of the septal mucoperichondrium had been obtained with the quilting stitch.  The patient was then returned to the anesthesiologist for awakening and taken to recovery room in good condition postoperatively.  Disposition:   PACU and d/c home  Plan: Ice, elevation, narcotic analgesia and prophylactic antibiotics. Begin sinus irrigations with saline tomrrow, irrigating 3-4 times daily. Return to the office in 7 days.  Return to work in 7-10 days, no strenuous activities for two weeks.   Riley Nearing 01/24/2016 11:07 AM 01/24/2016

## 2016-01-24 NOTE — H&P (Signed)
[name] Day, [name] Day [id number] 1945/12/11  Date of Admission: @TODAY @ Admitting Physician: Riley Nearing  Chief Complaint: Chronic sinusitis  HPI: This 70 y.o. year old female has had chronic sinusitis unresponsive to medical management with extended courses of antibiotics, prednisone and nasal steroids. She presents for endoscopic sinus surgery as well as septoplasty. There has been no change in her sinus symptoms since she was last seen, though she did have flu like symptoms with asthmatic wheezing in the interim, which required canceling her original surgery date. The symptoms have now resolved and she denies any shortness of breath or wheezing.  Medications:  Medications Prior to Admission  Medication Sig Dispense Refill  . albuterol (PROAIR HFA) 108 (90 BASE) MCG/ACT inhaler Inhale 2 puffs into the lungs every 4 (four) hours as needed for wheezing. 1 Inhaler 2  . ALPRAZolam (XANAX) 0.25 MG tablet Take 1 tablet (0.25 mg total) by mouth at bedtime as needed. 30 tablet 2  . chlorpheniramine-HYDROcodone (TUSSIONEX PENNKINETIC ER) 10-8 MG/5ML SUER Take 5 mLs by mouth every 12 (twelve) hours as needed. 115 mL 0  . cholecalciferol (VITAMIN D) 400 UNITS TABS tablet Take 400 Units by mouth.    . doxycycline (VIBRA-TABS) 100 MG tablet Take 1 tablet (100 mg total) by mouth 2 (two) times daily. 14 tablet 0  . fexofenadine (ALLEGRA) 180 MG tablet Take 180 mg by mouth daily.    Marland Kitchen FLUoxetine (PROZAC) 40 MG capsule TAKE 1 CAPSULE BY MOUTH EVERY DAY 90 capsule 2  . Fluticasone-Salmeterol (ADVAIR) 100-50 MCG/DOSE AEPB Inhale 1 puff into the lungs 2 (two) times daily as needed.    . gabapentin (NEURONTIN) 300 MG capsule TAKE ONE CAPSULE BY MOUTH WITH BREAKFAST AND LUNCH AND 2 CAPSULES EVERY NIGHT AT BEDTIME 360 capsule 3  . lactobacillus acidophilus (BACID) TABS tablet Take 1 tablet by mouth daily.    Marland Kitchen levothyroxine (SYNTHROID, LEVOTHROID) 100 MCG tablet TAKE 1 TABLET BY MOUTH EVERY DAY 30 tablet 11  .  lisinopril-hydrochlorothiazide (PRINZIDE,ZESTORETIC) 10-12.5 MG tablet Take 1 tablet by mouth daily. 90 tablet 1  . Multiple Vitamins-Minerals (SENIOR MULTIVITAMIN PLUS) TABS Take 1 tablet by mouth.      . Omega-3 Fatty Acids (FISH OIL) 1000 MG CAPS Take 1 capsule by mouth daily.    . predniSONE (DELTASONE) 50 MG tablet 1 tablet daily x 5 days. 5 tablet 0    Allergies:  Allergies  Allergen Reactions  . Aspirin Other (See Comments)    GI  bleed  . Statins Other (See Comments)    LEG & ARM CRAMPS  . Codeine Rash  . Latex Rash    Gloves, condoms, bandaids    PMH:  Past Medical History  Diagnosis Date  . Lichen sclerosus et atrophicus of the vulva 01/2011  . allergic rhinitis   . COPD (chronic obstructive pulmonary disease) ([place])     PFTS 2013 FEV1 nearly normalized  . Depression   . GERD (gastroesophageal reflux disease)   . Hyperlipidemia   . Hypertension   . Neuropathy ([place])     diabetic  . Hypothyroid 2002  . Rheumatic fever     HX  . Raynauds disease   . Arthritis     hands  . Diabetes mellitus     diet controlled  . Headache     sinus    Fam Hx:  Family History  Problem Relation Age of Onset  . Heart disease Mother     CHF and cardiomyopathy  . Thyroid disease Mother   .  Hyperlipidemia Mother   . Hyperlipidemia Father   . Cancer Father     liver  . Lupus Sister   . Hyperlipidemia Sister     Soc Hx:  Social History   Social History  . Marital Status: Married    Spouse Name: N/A  . Number of Children: N/A  . Years of Education: N/A   Occupational History  . 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 Smoking status: Former Smoker -- 0.50 packs/day for 25 years    Types: Cigarettes    Quit date: 09/17/1993  . Smokeless tobacco: Never Used  . Alcohol Use: No  . Drug Use: No  . Sexual Activity: Not Currently   Other Topics Concern  . Not on file   Social History Narrative    PSH:  Past Surgical  History  Procedure Laterality Date  . Colonoscopy  2004  . Abdominal hysterectomy  1978  . Spine surgery      L3 to L5  . Back surgery  2000    repair cord damage after 1st back surgery  . Tonsillectomy    .   ROS: Negative for Cough or wheezing or fever.   PHYSICAL EXAM  Vitals: Blood pressure 177/99, pulse 71, temperature 98.1 F (36.7 C), temperature source Temporal, height 5\' 5"  (1.651 m), weight 64.411 kg (142 lb), SpO2 99 %.. General: Well-developed, Well-nourished in no acute distress Mood: Mood and affect well adjusted, pleasant and cooperative. Orientation: Grossly alert and oriented. Vocal Quality: No hoarseness. Communicates verbally. head and Face: NCAT. No facial asymmetry. No visible skin lesions. No significant facial scars. Facial strength normal and symmetric. Respiratory: Normal respiratory effort without labored breathing. Cardiovascular: Carotid pulse shows regular rate and rhythm Neurologic: Cranial Nerves II through XII are grossly intact. Eyes: Gaze and Ocular Motility are grossly normal. PERRLA. No visible nystagmus.  MEDICAL DECISION MAKING: Data Review: No results found for this or any previous visit (from the past 48 hour(s)).Marland Kitchen No results found..   ASSESSMENT: Patient with chronic sinusitis and septal deviation unresponsive to medical management  PLAN: We will proceed with bilateral endoscopic maxillary antrostomies, total ethmoidectomy, and frontal recess exploration as well as nasal septoplasty. Surgical risk is been discussed and patient agrees to proceed.   Riley Nearing 01/24/2016 8:00 AM

## 2016-01-24 NOTE — Anesthesia Procedure Notes (Addendum)
Procedure Name: Intubation Date/Time: 01/24/2016 8:17 AM Performed by: Mayme Genta Pre-anesthesia Checklist: Patient identified, Emergency Drugs available, Suction available, Patient being monitored and Timeout performed Patient Re-evaluated:Patient Re-evaluated prior to inductionOxygen Delivery Method: Circle system utilized Preoxygenation: Pre-oxygenation with 100% oxygen Intubation Type: IV induction Ventilation: Mask ventilation without difficulty Laryngoscope Size: Miller and 2 Grade View: Grade I Tube type: Oral Rae Tube size: 7.0 mm Number of attempts: 1 Airway Equipment and Method: Stylet Placement Confirmation: ETT inserted through vocal cords under direct vision,  positive ETCO2 and breath sounds checked- equal and bilateral Tube secured with: Tape Dental Injury: Teeth and Oropharynx as per pre-operative assessment

## 2016-01-24 NOTE — Anesthesia Preprocedure Evaluation (Signed)
Anesthesia Evaluation  Patient identified by MRN, date of birth, ID band Patient awake    Reviewed: Allergy & Precautions, NPO status , Patient's Chart, lab work & pertinent test results  Airway Mallampati: II  TM Distance: >3 FB Neck ROM: Full    Dental no notable dental hx.    Pulmonary COPD,  COPD inhaler, former smoker,    Pulmonary exam normal        Cardiovascular hypertension, Pt. on medications + Peripheral Vascular Disease  Normal cardiovascular exam     Neuro/Psych  Headaches, PSYCHIATRIC DISORDERS Anxiety Depression Peripheral neuropathy    GI/Hepatic Neg liver ROS, GERD  ,  Endo/Other  diabetesHypothyroidism   Renal/GU negative Renal ROS     Musculoskeletal  (+) Arthritis , Osteoarthritis,    Abdominal   Peds  Hematology   Anesthesia Other Findings   Reproductive/Obstetrics                             Anesthesia Physical Anesthesia Plan  ASA: II  Anesthesia Plan: General   Post-op Pain Management:    Induction: Intravenous  Airway Management Planned:   Additional Equipment:   Intra-op Plan:   Post-operative Plan:   Informed Consent: I have reviewed the patients History and Physical, chart, labs and discussed the procedure including the risks, benefits and alternatives for the proposed anesthesia with the patient or authorized representative who has indicated his/her understanding and acceptance.     Plan Discussed with: CRNA  Anesthesia Plan Comments:         Anesthesia Quick Evaluation

## 2016-01-24 NOTE — H&P (Signed)
History and physical reviewed and will be scanned in later. No change in medical status reported by the patient or family, appears stable for surgery. All questions regarding the procedure answered, and patient (or family if a child) expressed understanding of the procedure.  Erin Uecker S @TODAY@ 

## 2016-01-24 NOTE — Transfer of Care (Signed)
Immediate Anesthesia Transfer of Care Note  Patient: [name] Day  Procedure(s) Performed: Procedure(s) with comments: IMAGE GUIDED SINUS SURGERY (N/A) - GAVE DISK TO CECE 3/27 SEPTOPLASTY (N/A) MAXILLARY ANTROSTOMY (Bilateral) TOTAL ETHMOIDECTOMY (Bilateral) FRONTAL SINUS EXPLORATION (Bilateral) - Latex sensitivity  Patient Location: PACU  Anesthesia Type: General  Level of Consciousness: awake, alert  and patient cooperative  Airway and Oxygen Therapy: Patient Spontanous Breathing and Patient connected to supplemental oxygen  Post-op Assessment: Post-op Vital signs reviewed, Patient's Cardiovascular Status Stable, Respiratory Function Stable, Patent Airway and No signs of Nausea or vomiting  Post-op Vital Signs: Reviewed and stable  Complications: No apparent anesthesia complications

## 2016-01-24 NOTE — Anesthesia Postprocedure Evaluation (Signed)
Anesthesia Post Note  Patient: [name] Day  Procedure(s) Performed: Procedure(s) (LRB): IMAGE GUIDED SINUS SURGERY (N/A) SEPTOPLASTY NASAL (N/A) MAXILLARY ANTROSTOMY BILATERAL ENDOSCOPICWITH TISSUE REMOVAL (Bilateral) TOTAL ETHMOIDECTOMY BILATERAL (Bilateral) BILATERAL FRONTAL SINUSOTOMY (Bilateral)  Patient location during evaluation: PACU Anesthesia Type: General Level of consciousness: awake and alert and oriented Pain management: pain level controlled Vital Signs Assessment: post-procedure vital signs reviewed and stable Respiratory status: spontaneous breathing and nonlabored ventilation Cardiovascular status: stable Postop Assessment: no signs of nausea or vomiting and adequate PO intake Anesthetic complications: no    Estill Batten

## 2016-01-25 ENCOUNTER — Encounter: Payer: Self-pay | Admitting: Otolaryngology

## 2016-01-26 LAB — SURGICAL PATHOLOGY

## 2016-01-30 DIAGNOSIS — J329 Chronic sinusitis, unspecified: Secondary | ICD-10-CM | POA: Diagnosis not present

## 2016-02-06 DIAGNOSIS — J329 Chronic sinusitis, unspecified: Secondary | ICD-10-CM | POA: Diagnosis not present

## 2016-04-24 ENCOUNTER — Other Ambulatory Visit: Payer: Self-pay | Admitting: Family Medicine

## 2016-04-24 ENCOUNTER — Ambulatory Visit (INDEPENDENT_AMBULATORY_CARE_PROVIDER_SITE_OTHER): Payer: Commercial Managed Care - HMO

## 2016-04-24 VITALS — BP 126/70 | HR 74 | Temp 98.0°F | Resp 12 | Ht 65.0 in | Wt 141.0 lb

## 2016-04-24 DIAGNOSIS — W57XXXA Bitten or stung by nonvenomous insect and other nonvenomous arthropods, initial encounter: Secondary | ICD-10-CM

## 2016-04-24 DIAGNOSIS — Z1239 Encounter for other screening for malignant neoplasm of breast: Secondary | ICD-10-CM

## 2016-04-24 DIAGNOSIS — Z0000 Encounter for general adult medical examination without abnormal findings: Secondary | ICD-10-CM | POA: Diagnosis not present

## 2016-04-24 DIAGNOSIS — T148 Other injury of unspecified body region: Secondary | ICD-10-CM | POA: Diagnosis not present

## 2016-04-24 NOTE — Progress Notes (Signed)
Subjective:   [name] Day is a 70 y.o. female who presents for Medicare Annual (Subsequent) preventive examination.  Review of Systems:  No ROS.  Medicare Wellness Visit.  Cardiac Risk Factors include: advanced age (>11men, >64 women);diabetes mellitus;hypertension     Objective:     Vitals: BP 126/70 (BP Location: Right Arm, Patient Position: Sitting, Cuff Size: Normal)   Pulse 74   Temp 98 F (36.7 C) (Oral)   Resp 12   Ht 5\' 5"  (1.651 m)   Wt 141 lb (64 kg)   SpO2 98%   BMI 23.46 kg/m   Body mass index is 23.46 kg/m.   Tobacco History  Smoking Status  . Former Smoker  . Packs/day: 0.50  . Years: 25.00  . Types: Cigarettes  . Quit date: 09/17/1993  Smokeless Tobacco  . Never Used     Counseling given: Not Answered   Past Medical History:  Diagnosis Date  . allergic rhinitis   . Arthritis    hands  . COPD (chronic obstructive pulmonary disease) ([place])    PFTS 2013 FEV1 nearly normalized  . Depression   . Diabetes mellitus    diet controlled  . GERD (gastroesophageal reflux disease)   . Headache    sinus  . Hyperlipidemia   . Hypertension   . Hypothyroid 2002  . Lichen sclerosus et atrophicus of the vulva 01/2011  . Neuropathy ([place])    diabetic  . Raynauds disease   . Rheumatic fever    HX   Past Surgical History:  Procedure Laterality Date  . ABDOMINAL HYSTERECTOMY  1978  . BACK SURGERY  2000   repair cord damage after 1st back surgery  . COLONOSCOPY  2004  . ETHMOIDECTOMY Bilateral 01/24/2016   Procedure: TOTAL ETHMOIDECTOMY BILATERAL;  Surgeon: Clyde Canterbury, MD;  Location: [place];  Service: ENT;  Laterality: Bilateral;  . FRONTAL SINUS EXPLORATION Bilateral 01/24/2016   Procedure: BILATERAL FRONTAL SINUSOTOMY;  Surgeon: Clyde Canterbury, MD;  Location: [place];  Service: ENT;  Laterality: Bilateral;  Latex sensitivity  . IMAGE GUIDED SINUS SURGERY N/A 01/24/2016   Procedure: IMAGE GUIDED SINUS SURGERY;  Surgeon: Clyde Canterbury, MD;  Location: [facility];  Service: ENT;  Laterality: N/A;  GAVE DISK TO CECE 3/27  . MAXILLARY ANTROSTOMY Bilateral 01/24/2016   Procedure: MAXILLARY ANTROSTOMY BILATERAL ENDOSCOPICWITH TISSUE REMOVAL;  Surgeon: Clyde Canterbury, MD;  Location: [facility];  Service: ENT;  Laterality: Bilateral;  . SEPTOPLASTY N/A 01/24/2016   Procedure: SEPTOPLASTY NASAL;  Surgeon: Clyde Canterbury, MD;  Location: [place] [facility];  Service: ENT;  Laterality: N/A;  . SPINE SURGERY     L3 to L5  . TONSILLECTOMY     Family History  Problem Relation Age of Onset  . Heart disease Mother     CHF and cardiomyopathy  . Thyroid disease Mother   . Hyperlipidemia Mother   . Hyperlipidemia Father   . Cancer Father     liver  . Lupus Sister   . Hyperlipidemia Sister    History  Sexual Activity  . Sexual activity: Not Currently    Outpatient Encounter Prescriptions as of 04/24/2016  Medication Sig  . albuterol (PROAIR HFA) 108 (90 BASE) MCG/ACT inhaler Inhale 2 puffs into the lungs every 4 (four) hours as needed for wheezing.  Marland Kitchen ALPRAZolam (XANAX) 0.25 MG tablet Take 1 tablet (0.25 mg total) by mouth at bedtime as needed.  . cholecalciferol (VITAMIN D) 400 UNITS TABS tablet Take 400  Units by mouth.  . fexofenadine (ALLEGRA) 180 MG tablet Take 180 mg by mouth daily.  Marland Kitchen FLUoxetine (PROZAC) 40 MG capsule TAKE 1 CAPSULE BY MOUTH EVERY DAY  . Fluticasone-Salmeterol (ADVAIR) 100-50 MCG/DOSE AEPB Inhale 1 puff into the lungs 2 (two) times daily as needed.  . gabapentin (NEURONTIN) 300 MG capsule TAKE ONE CAPSULE BY MOUTH WITH BREAKFAST AND LUNCH AND 2 CAPSULES EVERY NIGHT AT BEDTIME  . HYDROcodone-acetaminophen (NORCO/VICODIN) 5-325 MG tablet Take 1-2 tablets by mouth every 4 (four) hours as needed for moderate pain.  Marland Kitchen lactobacillus acidophilus (BACID) TABS tablet Take 1 tablet by mouth daily.  Marland Kitchen levothyroxine (SYNTHROID, LEVOTHROID) 100 MCG tablet TAKE 1 TABLET BY MOUTH EVERY DAY  .  lisinopril-hydrochlorothiazide (PRINZIDE,ZESTORETIC) 10-12.5 MG tablet Take 1 tablet by mouth daily.  . Multiple Vitamins-Minerals (SENIOR MULTIVITAMIN PLUS) TABS Take 1 tablet by mouth.    . Omega-3 Fatty Acids (FISH OIL) 1000 MG CAPS Take 1 capsule by mouth daily.  . [DISCONTINUED] cefdinir (OMNICEF) 300 MG capsule Take 1 capsule (300 mg total) by mouth 2 (two) times daily.   No facility-administered encounter medications on file as of 04/24/2016.     Activities of Daily Living In your present state of health, do you have any difficulty performing the following activities: 04/24/2016 01/24/2016  Hearing? N N  Vision? N N  Difficulty concentrating or making decisions? N N  Walking or climbing stairs? N N  Dressing or bathing? N N  Doing errands, shopping? N -  Preparing Food and eating ? N -  Using the Toilet? N -  In the past six months, have you accidently leaked urine? N -  Do you have problems with loss of bowel control? N -  Managing your Medications? N -  Managing your Finances? N -  Housekeeping or managing your Housekeeping? N -  Some recent data might be hidden    Patient Care Team: Crecencio Mc, MD as PCP - General (Internal Medicine)    Assessment:    This is a routine wellness examination for [name] Day. The goal of the wellness visit is to assist the patient how to close the gaps in care and create a preventative care plan for the patient.   Taking calcium VIT D as appropriate/Osteoporosis risk reviewed.  Medications reviewed; taking without issues or barriers.  Safety issues reviewed; smoke and carbon monoxide detectors in the home. No firearms in the home. Wears seatbelts when driving or riding with others. No violence in the home.  No identified risk were noted; The patient was oriented x 3; appropriate in dress and manner and no objective failures at ADL's or IADL's.   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 Inflamm polyarthrop NOS-stable and  followed by PCP.  Mammogram-ordered and scheduled.  Patient Concerns: States she had a total of four tick bites over the passed several months and has had intermittent diarrhea, muscle aches and rash.  She was offered an appointment to be seen but declined.  She said she feels fine right now and would only like to have labs drawn at this time, but will follow up as needed.  Labs ordered.    Exercise Activities and Dietary recommendations Current Exercise Habits: Home exercise routine, Type of exercise: walking, Time (Minutes): 25, Frequency (Times/Week): 7, Weekly Exercise (Minutes/Week): 175, Intensity: Moderate  Goals    . Healthy Lifestyle          STAY ACTIVE AND MAINTAIN EXERCISE ROUTINE. STAY HYDRATED AND DRINK PLENTY OF WATER. LOW  CARB FOODS.  LEAN MEATS AND VEGETABLES.      Fall Risk Fall Risk  04/24/2016 10/24/2015 01/16/2015 01/13/2015 10/19/2013  Falls in the past year? No No No Yes No  Number falls in past yr: - - - 1 -  Injury with Fall? - - - No -   Depression Screen PHQ 2/9 Scores 04/24/2016 10/24/2015 01/16/2015 01/13/2015  PHQ - 2 Score 0 0 0 0  PHQ- 9 Score - - - -     Cognitive Testing MMSE - Mini Mental State Exam 04/24/2016  Orientation to time 5  Orientation to Place 5  Registration 3  Attention/ Calculation 5  Recall 3  Language- name 2 objects 2  Language- repeat 1  Language- follow 3 step command 3  Language- read & follow direction 1  Write a sentence 1  Copy design 1  Total score 30    Immunization History  Administered Date(s) Administered  . Influenza Split 07/11/2014  . Influenza Whole 05/29/2008, 05/14/2011, 07/11/2012  . Influenza, High Dose Seasonal PF 10/24/2015  . Influenza,inj,Quad PF,36+ Mos 06/16/2013  . Influenza-Unspecified 05/18/2014  . Pneumococcal Conjugate-13 11/05/2013  . Pneumococcal Polysaccharide-23 08/28/2010, 10/24/2015  . Td 09/24/2003, 10/06/2014   Screening Tests Health Maintenance  Topic Date Due  . ZOSTAVAX  12/25/2006    . OPHTHALMOLOGY EXAM  08/20/2014  . MAMMOGRAM  09/26/2014  . FOOT EXAM  03/10/2015  . INFLUENZA VACCINE  04/17/2016  . HEMOGLOBIN A1C  04/22/2016  . COLONOSCOPY  04/17/2017  . TETANUS/TDAP  10/06/2024  . DEXA SCAN  Completed  . Hepatitis C Screening  Completed  . PNA vac Low Risk Adult  Completed      Plan:   End of life planning; Advance aging; Advanced directives discussed. Copy of current HCPOA/Living Will requested.  During the course of the visit the patient was educated and counseled about the following appropriate screening and preventive services:   Vaccines to include Pneumoccal, Influenza, Hepatitis B, Td, Zostavax, HCV  Electrocardiogram  Cardiovascular Disease  Colorectal cancer screening  Bone density screening  Diabetes screening  Glaucoma screening  Mammography/PAP  Nutrition counseling   Patient Instructions (the written plan) was given to the patient.   Varney Biles, LPN  [id number]

## 2016-04-24 NOTE — Progress Notes (Signed)
Care was provided under my supervision. I agree with the management as indicated in the note.  Ozella Comins DO  

## 2016-04-24 NOTE — Progress Notes (Signed)
  I have reviewed the above information and agree with above.  I cannot and will not diagnose a new condition without an office visit, so if the [place] test is negative and she requests additional information about her symptoms plesae offer her an appointment.   Deborra Medina, MD

## 2016-04-24 NOTE — Patient Instructions (Addendum)
[name] Day , Thank you for taking time to come for your Medicare Wellness Visit. I appreciate your ongoing commitment to your health goals. Please review the following plan we discussed and let me know if I can assist you in the future.   FOLLOW UP WITH DR. Derrel Nip AS NEEDED.  MAMMOGRAM AS DIRECTED.  LABS  These are the goals we discussed: Goals    . Healthy Lifestyle          STAY ACTIVE AND MAINTAIN EXERCISE ROUTINE. STAY HYDRATED AND DRINK PLENTY OF WATER. LOW CARB FOODS.  LEAN MEATS AND VEGETABLES.       This is a list of the screening recommended for you and due dates:  Health Maintenance  Topic Date Due  . Shingles Vaccine  12/25/2006  . Eye exam for diabetics  08/20/2014  . Mammogram  09/26/2014  . Complete foot exam   03/10/2015  . Flu Shot  04/17/2016  . Hemoglobin A1C  04/22/2016  . Colon Cancer Screening  04/17/2017  . Tetanus Vaccine  10/06/2024  . DEXA scan (bone density measurement)  Completed  .  Hepatitis C: One time screening is recommended by Center for Disease Control  (CDC) for  adults born from [birth year] through 1964.   Completed  . Pneumonia vaccines  Completed   Diabetes and Foot Care Diabetes may cause you to have problems because of poor blood supply (circulation) to your feet and legs. This may cause the skin on your feet to become thinner, break easier, and heal more slowly. Your skin may become dry, and the skin may peel and crack. You may also have nerve damage in your legs and feet causing decreased feeling in them. You may not notice minor injuries to your feet that could lead to infections or more serious problems. Taking care of your feet is one of the most important things you can do for yourself.  HOME CARE INSTRUCTIONS  Wear shoes at all times, even in the house. Do not go barefoot. Bare feet are easily injured.  Check your feet daily for blisters, cuts, and redness. If you cannot see the bottom of your feet, use a mirror or ask someone for  help.  Wash your feet with warm water (do not use hot water) and mild soap. Then pat your feet and the areas between your toes until they are completely dry. Do not soak your feet as this can dry your skin.  Apply a moisturizing lotion or petroleum jelly (that does not contain alcohol and is unscented) to the skin on your feet and to dry, brittle toenails. Do not apply lotion between your toes.  Trim your toenails straight across. Do not dig under them or around the cuticle. File the edges of your nails with an emery board or nail file.  Do not cut corns or calluses or try to remove them with medicine.  Wear clean socks or stockings every day. Make sure they are not too tight. Do not wear knee-high stockings since they may decrease blood flow to your legs.  Wear shoes that fit properly and have enough cushioning. To break in new shoes, wear them for just a few hours a day. This prevents you from injuring your feet. Always look in your shoes before you put them on to be sure there are no objects inside.  Do not cross your legs. This may decrease the blood flow to your feet.  If you find a minor scrape, cut, or break in the  skin on your feet, keep it and the skin around it clean and dry. These areas may be cleansed with mild soap and water. Do not cleanse the area with peroxide, alcohol, or iodine.  When you remove an adhesive bandage, be sure not to damage the skin around it.  If you have a wound, look at it several times a day to make sure it is healing.  Do not use heating pads or hot water bottles. They may burn your skin. If you have lost feeling in your feet or legs, you may not know it is happening until it is too late.  Make sure your health care provider performs a complete foot exam at least annually or more often if you have foot problems. Report any cuts, sores, or bruises to your health care provider immediately. SEEK MEDICAL CARE IF:   You have an injury that is not  healing.  You have cuts or breaks in the skin.  You have an ingrown nail.  You notice redness on your legs or feet.  You feel burning or tingling in your legs or feet.  You have pain or cramps in your legs and feet.  Your legs or feet are numb.  Your feet always feel cold. SEEK IMMEDIATE MEDICAL CARE IF:   There is increasing redness, swelling, or pain in or around a wound.  There is a red line that goes up your leg.  Pus is coming from a wound.  You develop a fever or as directed by your health care provider.  You notice a bad smell coming from an ulcer or wound.   This information is not intended to replace advice given to you by your health care provider. Make sure you discuss any questions you have with your health care provider.   Document Released: 08/31/2000 Document Revised: 05/06/2013 Document Reviewed: 02/10/2013 Elsevier Interactive Patient Education 2016 [place] A mammogram is an X-ray of the breasts that is done to check for abnormal changes. This procedure can screen for and detect any changes that may suggest breast cancer. A mammogram can also identify other changes and variations in the breast, such as:  Inflammation of the breast tissue (mastitis).  An infected area that contains a collection of pus (abscess).  A fluid-filled sac (cyst).  Fibrocystic changes. This is when breast tissue becomes denser, which can make the tissue feel rope-like or uneven under the skin.  Tumors that are not cancerous (benign). LET [facility] CARE PROVIDER KNOW ABOUT:  Any allergies you have.  If you have breast implants.  If you have had previous breast disease, biopsy, or surgery.  If you are breastfeeding.  Any possibility that you could be pregnant, if this applies.  If you are younger than age 37.  If you have a family history of breast cancer. RISKS AND COMPLICATIONS Generally, this is a safe procedure. However, problems may occur,  including:  Exposure to radiation. Radiation levels are very low with this test.  The results being misinterpreted.  The need for further tests.  The inability of the mammogram to detect certain cancers. BEFORE THE PROCEDURE  Schedule your test about 1-2 weeks after your menstrual period. This is usually when your breasts are the least tender.  If you have had a mammogram done at a different facility in the past, get the mammogram X-rays or have them sent to your current exam facility in order to compare them.  Wash your breasts and under your arms  the day of the test.  Do not wear deodorants, perfumes, lotions, or powders anywhere on your body on the day of the test.  Remove any jewelry from your neck.  Wear clothes that you can change into and out of easily. PROCEDURE  You will undress from the waist up and put on a gown.  You will stand in front of the X-ray machine.  Each breast will be placed between two plastic or glass plates. The plates will compress your breast for a few seconds. Try to stay as relaxed as possible during the procedure. This does not cause any harm to your breasts and any discomfort you feel will be very brief.  X-rays will be taken from different angles of each breast. The procedure may vary among health care providers and hospitals. AFTER THE PROCEDURE  The mammogram will be examined by a specialist (radiologist).  You may need to repeat certain parts of the test, depending on the quality of the images. This is commonly done if the radiologist needs a better view of the breast tissue.  Ask when your test results will be ready. Make sure you get your test results.  You may resume your normal activities.   This information is not intended to replace advice given to you by your health care provider. Make sure you discuss any questions you have with your health care provider.   Document Released: 08/31/2000 Document Revised: 05/25/2015 Document  Reviewed: 11/12/2014 Elsevier Interactive Patient Education [employer].

## 2016-04-25 LAB — LYME AB/WESTERN BLOT REFLEX

## 2016-04-26 ENCOUNTER — Other Ambulatory Visit: Payer: Self-pay | Admitting: Internal Medicine

## 2016-04-30 ENCOUNTER — Other Ambulatory Visit: Payer: Self-pay | Admitting: Internal Medicine

## 2016-05-07 NOTE — Progress Notes (Signed)
  I have reviewed the above information and agree with above.   Katlynn Reginaldo Hazard, MD 

## 2016-05-10 ENCOUNTER — Ambulatory Visit
Admission: RE | Admit: 2016-05-10 | Discharge: 2016-05-10 | Disposition: A | Discharge status: Home / Self Care | Payer: Commercial Managed Care - HMO | Source: Ambulatory Visit | Attending: Internal Medicine | Admitting: Internal Medicine

## 2016-05-10 ENCOUNTER — Other Ambulatory Visit: Payer: Self-pay | Admitting: Internal Medicine

## 2016-05-10 DIAGNOSIS — Z1231 Encounter for screening mammogram for malignant neoplasm of breast: Secondary | ICD-10-CM | POA: Diagnosis not present

## 2016-05-10 DIAGNOSIS — Z1239 Encounter for other screening for malignant neoplasm of breast: Secondary | ICD-10-CM

## 2016-05-10 HISTORY — DX: Malignant (primary) neoplasm, unspecified: C80.1

## 2016-05-16 ENCOUNTER — Other Ambulatory Visit: Payer: Self-pay | Admitting: Internal Medicine

## 2016-06-22 ENCOUNTER — Telehealth: Payer: Self-pay | Admitting: Internal Medicine

## 2016-06-22 DIAGNOSIS — E0840 Diabetes mellitus due to underlying condition with diabetic neuropathy, unspecified: Secondary | ICD-10-CM

## 2016-06-22 NOTE — Telephone Encounter (Signed)
Your referral is in process as requested. Our referral coordinator will call you when the appointment has been made.  

## 2016-06-22 NOTE — Telephone Encounter (Signed)
Patient has been informed.

## 2016-06-22 NOTE — Telephone Encounter (Signed)
Pt sent a Mychart wanting to get a referral to the ophthalmologist. Please and thank you!  Call pt @ [phone].

## 2016-06-22 NOTE — Telephone Encounter (Signed)
Please advise 

## 2016-06-26 ENCOUNTER — Other Ambulatory Visit: Payer: Self-pay | Admitting: Internal Medicine

## 2016-06-27 NOTE — Telephone Encounter (Signed)
Refilled for 30 days.  Needs 6 month folllow up appt

## 2016-06-27 NOTE — Telephone Encounter (Signed)
Refilled on 06/01/15. Pt last seen 01/02/16 with Dr.Cook. Pt was seen by Dr.Tullo on 10/24/15. Pt stated she is taking 1 in the morning, 1 at lunch , and 2 in the evening. Please advise?

## 2016-07-30 ENCOUNTER — Other Ambulatory Visit: Payer: Self-pay | Admitting: Internal Medicine

## 2016-07-30 MED ORDER — LISINOPRIL-HYDROCHLOROTHIAZIDE 10-12.5 MG PO TABS: 1.0000 | ORAL_TABLET | Freq: Every day | ORAL | 0 refills | Status: DC

## 2016-08-01 DIAGNOSIS — E119 Type 2 diabetes mellitus without complications: Secondary | ICD-10-CM | POA: Diagnosis not present

## 2016-08-01 LAB — HM DIABETES EYE EXAM

## 2016-08-29 ENCOUNTER — Encounter: Payer: Self-pay | Admitting: Internal Medicine

## 2016-10-29 ENCOUNTER — Other Ambulatory Visit: Payer: Self-pay | Admitting: Internal Medicine

## 2016-11-01 MED ORDER — LISINOPRIL-HYDROCHLOROTHIAZIDE 10-12.5 MG PO TABS: 1.0000 | ORAL_TABLET | Freq: Every day | ORAL | 0 refills | Status: DC

## 2016-11-01 MED ORDER — GABAPENTIN 300 MG PO CAPS: ORAL_CAPSULE | ORAL | 0 refills | Status: DC

## 2016-11-12 ENCOUNTER — Other Ambulatory Visit: Payer: Self-pay | Admitting: Internal Medicine

## 2016-11-28 ENCOUNTER — Other Ambulatory Visit: Payer: Self-pay | Admitting: Internal Medicine

## 2017-02-05 ENCOUNTER — Other Ambulatory Visit: Payer: Self-pay | Admitting: Internal Medicine

## 2017-02-05 DIAGNOSIS — E0840 Diabetes mellitus due to underlying condition with diabetic neuropathy, unspecified: Secondary | ICD-10-CM

## 2017-02-05 DIAGNOSIS — E782 Mixed hyperlipidemia: Secondary | ICD-10-CM

## 2017-02-05 DIAGNOSIS — I10 Essential (primary) hypertension: Secondary | ICD-10-CM

## 2017-02-05 DIAGNOSIS — R5383 Other fatigue: Secondary | ICD-10-CM

## 2017-02-05 DIAGNOSIS — E559 Vitamin D deficiency, unspecified: Secondary | ICD-10-CM

## 2017-02-05 NOTE — Telephone Encounter (Signed)
Pt called needing a refill for gabapentin (NEURONTIN) 300 MG capsule and lisinopril-hydrochlorothiazide (PRINZIDE,ZESTORETIC) 10-12.5 MG tablet.  Pharmacy is [facility], [place] AT [address]  Call pt @ [phone]. Thank you!

## 2017-02-06 NOTE — Telephone Encounter (Signed)
Gabapentin:   Refilled: 11/01/2016  Lisinopril-hctz:  Refilled: 11/01/2016  Last OV: 01/02/2016 Next OV: 04/03/2017 Last labs: 10/24/2015

## 2017-02-06 NOTE — Telephone Encounter (Signed)
Pt called back and stated that she has been out of medication for 5 days, and that they pharmacy got a response stating that she was not a patient here. Please advise, thank you!  Call pt @ [phone]

## 2017-02-06 NOTE — Telephone Encounter (Signed)
Patients appointment has been moved up to 02/18/17 .   Is there anyway medication can be refilled until appointment

## 2017-02-07 ENCOUNTER — Other Ambulatory Visit: Payer: Self-pay | Admitting: Internal Medicine

## 2017-02-07 MED ORDER — LISINOPRIL-HYDROCHLOROTHIAZIDE 10-12.5 MG PO TABS: 1.0000 | ORAL_TABLET | Freq: Every day | ORAL | 0 refills | Status: DC

## 2017-02-07 MED ORDER — GABAPENTIN 300 MG PO CAPS: ORAL_CAPSULE | ORAL | 0 refills | Status: DC

## 2017-02-07 NOTE — Telephone Encounter (Signed)
Will refill for 30 days,  PLEASE ASKPATIENT TO HAVE LABS DONE PRIOR TO VISIT IF POSSIBLE

## 2017-02-18 ENCOUNTER — Ambulatory Visit (INDEPENDENT_AMBULATORY_CARE_PROVIDER_SITE_OTHER): Payer: Commercial Managed Care - HMO | Admitting: Internal Medicine

## 2017-02-18 ENCOUNTER — Encounter: Payer: Self-pay | Admitting: Internal Medicine

## 2017-02-18 VITALS — BP 162/90 | HR 75 | Temp 98.8°F | Resp 16 | Ht 65.0 in | Wt 147.5 lb

## 2017-02-18 DIAGNOSIS — F411 Generalized anxiety disorder: Secondary | ICD-10-CM | POA: Diagnosis not present

## 2017-02-18 DIAGNOSIS — M064 Inflammatory polyarthropathy: Secondary | ICD-10-CM | POA: Diagnosis not present

## 2017-02-18 DIAGNOSIS — D751 Secondary polycythemia: Secondary | ICD-10-CM | POA: Diagnosis not present

## 2017-02-18 DIAGNOSIS — R7989 Other specified abnormal findings of blood chemistry: Secondary | ICD-10-CM | POA: Diagnosis not present

## 2017-02-18 DIAGNOSIS — R718 Other abnormality of red blood cells: Secondary | ICD-10-CM | POA: Diagnosis not present

## 2017-02-18 DIAGNOSIS — I10 Essential (primary) hypertension: Secondary | ICD-10-CM

## 2017-02-18 DIAGNOSIS — E034 Atrophy of thyroid (acquired): Secondary | ICD-10-CM | POA: Diagnosis not present

## 2017-02-18 DIAGNOSIS — E559 Vitamin D deficiency, unspecified: Secondary | ICD-10-CM | POA: Diagnosis not present

## 2017-02-18 DIAGNOSIS — E0840 Diabetes mellitus due to underlying condition with diabetic neuropathy, unspecified: Secondary | ICD-10-CM | POA: Diagnosis not present

## 2017-02-18 DIAGNOSIS — D582 Other hemoglobinopathies: Secondary | ICD-10-CM

## 2017-02-18 DIAGNOSIS — E782 Mixed hyperlipidemia: Secondary | ICD-10-CM | POA: Diagnosis not present

## 2017-02-18 DIAGNOSIS — D126 Benign neoplasm of colon, unspecified: Secondary | ICD-10-CM | POA: Diagnosis not present

## 2017-02-18 MED ORDER — SERTRALINE HCL 100 MG PO TABS: 100.0000 mg | ORAL_TABLET | Freq: Every day | ORAL | 3 refills | Status: DC

## 2017-02-18 MED ORDER — ALPRAZOLAM 0.25 MG PO TABS: 0.2500 mg | ORAL_TABLET | Freq: Every evening | ORAL | 2 refills | Status: DC | PRN

## 2017-02-18 MED ORDER — ALPRAZOLAM 0.25 MG PO TABS: 5.0000 mg | ORAL_TABLET | Freq: Every evening | ORAL | 2 refills | Status: DC | PRN

## 2017-02-18 NOTE — Patient Instructions (Signed)
Good to see you again!  I will order your colonoscopy  For later in the summer  I will refill your thyroid medication once I see your labs from today

## 2017-02-18 NOTE — Progress Notes (Signed)
Subjective:  Patient ID: [name] Day, female    DOB: October 03, 1945  Age: 71 y.o. MRN: [id number]  CC: The primary encounter diagnosis was Mixed hyperlipidemia. Diagnoses of Anxiety state, Essential hypertension, Diabetes mellitus due to underlying condition with diabetic neuropathy, without long-term current use of insulin ([place]), Hypothyroidism due to acquired atrophy of thyroid, Inflammatory polyarthritis ([place]), Polycythemia, secondary, Elevated ferritin, hemoglobin, and red blood cell count ([place]), Vitamin D deficiency, Tubular adenoma of colon, and Neurosis, anxiety, generalized were also pertinent to this visit.  HPI [name] Day presents for follow up  On multiple issues including type 3 diabetes mellitus,   Hypertension,  and hypothyroidism .  She has been lost to follow up for over a year, following her sinus surgery in 2017 for recurrent sinusitis.   During that time she sold her home and downsized to a smaller home in a retirement community.  she lives alone except for her advanced age dog,  Whom she is very fond of and admits to worrying about incessantly .  She has not been checking sugars  Since her  sinus surgery in 2017 she has had no infections since then   Anxiety:  Not sleeping even 4 hours per night .  Used to use 1/2 of 0.25 mg alprazolam, requesting refill.    Has been takin prozac for over 20 years,  prescribed remotely for depression  After her child died .  Last  Colonoscopy was done in 2008 and a tubular adenoma was found but she deferred earlier follow up colonoscopy (Dr Collene Mares).  Discussed arranging colonoscopy in [place] with an August targeted ate so her sister would be able to accompany her .  She has chronic Leg pain managed with gabapentin . Inflammatory arthritis previously managed by Jefm Bryant with placquenil and celebrex, also lost to follow up .  Would like to resume the celebrex  Has not taken fish oil,  Nose bleeds have stopped .  Last dose of  levothyroxine was this morning..         Outpatient Medications Prior to Visit  Medication Sig Dispense Refill  . albuterol (PROAIR HFA) 108 (90 BASE) MCG/ACT inhaler Inhale 2 puffs into the lungs every 4 (four) hours as needed for wheezing. 1 Inhaler 2  . cholecalciferol (VITAMIN D) 400 UNITS TABS tablet Take 400 Units by mouth.    . fexofenadine (ALLEGRA) 180 MG tablet Take 180 mg by mouth daily.    Marland Kitchen FLUoxetine (PROZAC) 40 MG capsule TAKE 1 CAPSULE BY MOUTH EVERY DAY 90 capsule 0  . Fluticasone-Salmeterol (ADVAIR) 100-50 MCG/DOSE AEPB Inhale 1 puff into the lungs 2 (two) times daily as needed.    . gabapentin (NEURONTIN) 300 MG capsule TAKE 1 CAPSULE BY MOUTH WITH BREAKFAST AND LUNCH, AND 2 CAPSULES BY MOUTH EVERY NIGHT AT BEDTIME 120 capsule 0  . lactobacillus acidophilus (BACID) TABS tablet Take 1 tablet by mouth daily.    Marland Kitchen levothyroxine (SYNTHROID, LEVOTHROID) 100 MCG tablet TAKE 1 TABLET BY MOUTH EVERY DAY 30 tablet 2  . lisinopril-hydrochlorothiazide (PRINZIDE,ZESTORETIC) 10-12.5 MG tablet Take 1 tablet by mouth daily. 30 tablet 0  . Multiple Vitamins-Minerals (SENIOR MULTIVITAMIN PLUS) TABS Take 1 tablet by mouth.      . ALPRAZolam (XANAX) 0.25 MG tablet Take 1 tablet (0.25 mg total) by mouth at bedtime as needed. 30 tablet 2  . HYDROcodone-acetaminophen (NORCO/VICODIN) 5-325 MG tablet Take 1-2 tablets by mouth every 4 (four) hours as needed for moderate pain. 40 tablet 0  . Omega-3  Fatty Acids (FISH OIL) 1000 MG CAPS Take 1 capsule by mouth daily.     No facility-administered medications prior to visit.     Review of Systems;  Patient denies headache, fevers, malaise, unintentional weight loss, skin rash, eye pain, sinus congestion and sinus pain, sore throat, dysphagia,  hemoptysis , cough, dyspnea, wheezing, chest pain, palpitations, orthopnea, edema, abdominal pain, nausea, melena, diarrhea, constipation, flank pain, dysuria, hematuria, urinary  Frequency, nocturia,  numbness, tingling, seizures,  Focal weakness, Loss of consciousness,  Tremor,  depression, , and suicidal ideation.      Objective:  BP (!) 162/90   Pulse 75   Temp 98.8 F (37.1 C) (Oral)   Resp 16   Ht '5\' 5"'  (1.651 m)   Wt 147 lb 8 oz (66.9 kg)   SpO2 96%   BMI 24.55 kg/m   BP Readings from Last 3 Encounters:  02/18/17 (!) 162/90  04/24/16 126/70  01/24/16 (!) 169/82    Wt Readings from Last 3 Encounters:  02/18/17 147 lb 8 oz (66.9 kg)  04/24/16 141 lb (64 kg)  01/24/16 142 lb (64.4 kg)    General appearance: alert, cooperative and appears stated age Ears: normal TM's and external ear canals both ears Throat: lips, mucosa, and tongue normal; teeth and gums normal Neck: no adenopathy, no carotid bruit, supple, symmetrical, trachea midline and thyroid not enlarged, symmetric, no tenderness/mass/nodules Back: symmetric, no curvature. ROM normal. No CVA tenderness. Lungs: clear to auscultation bilaterally Heart: regular rate and rhythm, S1, S2 normal, no murmur, click, rub or gallop Abdomen: soft, non-tender; bowel sounds normal; no masses,  no organomegaly Pulses: 2+ and symmetric Skin: Skin color, texture, turgor normal. No rashes or lesions Lymph nodes: Cervical, supraclavicular, and axillary nodes normal.  Lab Results  Component Value Date   HGBA1C 5.9 10/24/2015   HGBA1C 5.8 01/13/2015   HGBA1C 5.9 03/08/2014    Lab Results  Component Value Date   CREATININE 0.62 10/24/2015   CREATININE 0.62 01/13/2015   CREATININE 0.9 03/08/2014    Lab Results  Component Value Date   WBC 10.2 05/09/2015   HGB 15.5 05/09/2015   HCT 45.3 05/09/2015   PLT 266 05/09/2015   GLUCOSE 87 10/24/2015   CHOL 292 (H) 10/24/2015   TRIG 201.0 (H) 10/24/2015   HDL 47.20 10/24/2015   LDLDIRECT 207.0 10/24/2015   LDLCALC 81 03/08/2014   ALT 16 10/24/2015   AST 18 10/24/2015   NA 137 10/24/2015   K 4.4 10/24/2015   CL 98 10/24/2015   CREATININE 0.62 10/24/2015   BUN 18  10/24/2015   CO2 29 10/24/2015   TSH 1.190 10/24/2015   HGBA1C 5.9 10/24/2015   MICROALBUR <0.7 10/24/2015    Mm Screening Breast Tomo Bilateral  Result Date: 05/10/2016 CLINICAL DATA:  Screening. EXAM: 2D DIGITAL SCREENING BILATERAL MAMMOGRAM WITH CAD AND ADJUNCT TOMO COMPARISON:  Previous exam(s). ACR Breast Density Category c: The breast tissue is heterogeneously dense, which may obscure small masses. FINDINGS: There are no findings suspicious for malignancy. Images were processed with CAD. IMPRESSION: No mammographic evidence of malignancy. A result letter of this screening mammogram will be mailed directly to the patient. RECOMMENDATION: Screening mammogram in one year. (Code:SM-B-01Y) BI-RADS CATEGORY  1: Negative. Electronically Signed   By: Ammie Ferrier M.D.   On: 05/10/2016 12:19    Assessment & Plan:   Problem List Items Addressed This Visit    Tubular adenoma of colon    By report from Dr Juanita Craver  in 2008.  Patient has deferred earlier follow up, now willing to have repeat clonoscopy if done in [place]. refer to [facility].       Polycythemia, secondary   Relevant Orders   CBC with Differential/Platelet   Neurosis, anxiety, generalized    She notes excessive worry, particularly about her aging beloved dog.  Some compulsiveness reported,  Discussed change from prozac to zoloft 100 mg daily.  And prn alprazolam for anxiety related insomnia. The risks and benefits of benzodiazepine use were discussed with patient today including excessive sedation leading to respiratory depression,  impaired thinking/driving, and addiction.  Patient was advised to avoid concurrent use with alcohol, to use medication only as needed and not to share with others  .       Mixed hyperlipidemia - Primary   Inflammatory polyarthritis (HCC)    Previously managed with placquenil and celebrex by Dr Jefm Bryant  Checking ESR today . Resume celebrex pending assessment of renal function         Relevant Orders   Sedimentation rate   Hypothyroidism    Will refill levothyroxine pending evaluation of tsh      Relevant Orders   TSH   Hypertension    Elevated today  On lisinopril 10/12.5.  Will change medication to losartan hct pending evaluation of labs.       Relevant Orders   Comprehensive metabolic panel   Elevated ferritin, hemoglobin, and red blood cell count (HCC)    Repeating today ,  History of secondary polycythemia      Relevant Orders   Iron   Ferritin   Diabetes mellitus with neuropathy ([place])     Historically well-controlled on diet alone given hemoglobin A1c which has been consistently at or  less than 6.0 . Patient is up-to-date on eye exams and foot exam was normal today   Patient is overdue for labs and has no microalbuminuria. Patient has avoided  statin therapy for CAD risk reduction but has tolerated  ACE/ARB for renal protection and hypertension       Relevant Orders   Hemoglobin A1c   Lipid panel    Other Visit Diagnoses    Anxiety state       Relevant Medications   sertraline (ZOLOFT) 100 MG tablet   ALPRAZolam (XANAX) 0.25 MG tablet   Vitamin D deficiency        A total of 40 minutes was spent with patient more than half of which was spent in counseling patient on the above mentioned issues , reviewing and explaining recent labs and imaging studies done, and coordination of care.  I have discontinued Ms. Elbaum Fish Oil and HYDROcodone-acetaminophen. I am also having her start on sertraline. Additionally, I am having her maintain her SENIOR MULTIVITAMIN PLUS, fexofenadine, albuterol, cholecalciferol, lactobacillus acidophilus, Fluticasone-Salmeterol, levothyroxine, FLUoxetine, gabapentin, lisinopril-hydrochlorothiazide, and ALPRAZolam.  Meds ordered this encounter  Medications  . sertraline (ZOLOFT) 100 MG tablet    Sig: Take 1 tablet (100 mg total) by mouth daily.    Dispense:  30 tablet    Refill:  3  . DISCONTD: ALPRAZolam (XANAX)  0.25 MG tablet    Sig: Take 20 tablets (5 mg total) by mouth at bedtime as needed for sleep.    Dispense:  30 tablet    Refill:  2  . ALPRAZolam (XANAX) 0.25 MG tablet    Sig: Take 1 tablet (0.25 mg total) by mouth at bedtime as needed.    Dispense:  30 tablet  Refill:  2    Medications Discontinued During This Encounter  Medication Reason  . HYDROcodone-acetaminophen (NORCO/VICODIN) 5-325 MG tablet No longer needed (for PRN medications)  . Omega-3 Fatty Acids (FISH OIL) 1000 MG CAPS Patient Discharge  . ALPRAZolam (XANAX) 0.25 MG tablet Reorder  . ALPRAZolam (XANAX) 0.25 MG tablet Entry Error    Follow-up: No Follow-up on file.   Crecencio Mc, MD

## 2017-02-19 ENCOUNTER — Other Ambulatory Visit: Payer: Self-pay | Admitting: Internal Medicine

## 2017-02-19 ENCOUNTER — Encounter: Payer: Self-pay | Admitting: Internal Medicine

## 2017-02-19 DIAGNOSIS — F411 Generalized anxiety disorder: Secondary | ICD-10-CM | POA: Insufficient documentation

## 2017-02-19 DIAGNOSIS — D126 Benign neoplasm of colon, unspecified: Secondary | ICD-10-CM | POA: Insufficient documentation

## 2017-02-19 LAB — CBC WITH DIFFERENTIAL/PLATELET
Basophils Absolute: 0.1 10*3/uL (ref 0.0–0.1)
Basophils Relative: 1 % (ref 0.0–3.0)
EOS PCT: 2.4 % (ref 0.0–5.0)
Eosinophils Absolute: 0.3 10*3/uL (ref 0.0–0.7)
HCT: 46.8 % — ABNORMAL HIGH (ref 36.0–46.0)
HEMOGLOBIN: 15.9 g/dL — AB (ref 12.0–15.0)
LYMPHS PCT: 27.5 % (ref 12.0–46.0)
Lymphs Abs: 2.8 10*3/uL (ref 0.7–4.0)
MCHC: 34.1 g/dL (ref 30.0–36.0)
MCV: 85.6 fl (ref 78.0–100.0)
Monocytes Absolute: 0.9 10*3/uL (ref 0.1–1.0)
Monocytes Relative: 8.4 % (ref 3.0–12.0)
Neutro Abs: 6.2 10*3/uL (ref 1.4–7.7)
Neutrophils Relative %: 60.7 % (ref 43.0–77.0)
Platelets: 299 10*3/uL (ref 150.0–400.0)
RBC: 5.47 Mil/uL — AB (ref 3.87–5.11)
RDW: 13.3 % (ref 11.5–15.5)
WBC: 10.3 10*3/uL (ref 4.0–10.5)

## 2017-02-19 LAB — LDL CHOLESTEROL, DIRECT: LDL DIRECT: 197 mg/dL

## 2017-02-19 LAB — COMPREHENSIVE METABOLIC PANEL
ALT: 22 U/L (ref 0–35)
AST: 20 U/L (ref 0–37)
Albumin: 4.6 g/dL (ref 3.5–5.2)
Alkaline Phosphatase: 71 U/L (ref 39–117)
BILIRUBIN TOTAL: 0.4 mg/dL (ref 0.2–1.2)
BUN: 26 mg/dL — ABNORMAL HIGH (ref 6–23)
CO2: 27 meq/L (ref 19–32)
Calcium: 10.3 mg/dL (ref 8.4–10.5)
Chloride: 100 mEq/L (ref 96–112)
Creatinine, Ser: 0.81 mg/dL (ref 0.40–1.20)
GFR: 74.26 mL/min (ref >60.00)
GLUCOSE: 108 mg/dL — AB (ref 70–99)
Potassium: 3.9 mEq/L (ref 3.5–5.1)
Sodium: 136 mEq/L (ref 135–145)
Total Protein: 7.5 g/dL (ref 6.0–8.3)

## 2017-02-19 LAB — LIPID PANEL
Cholesterol: 272 mg/dL — ABNORMAL HIGH (ref 0–200)
HDL: 47.5 mg/dL (ref >39.00)
NONHDL: 224.86
Total CHOL/HDL Ratio: 6
Triglycerides: 216 mg/dL — ABNORMAL HIGH (ref 0.0–149.0)
VLDL: 43.2 mg/dL — AB (ref 0.0–40.0)

## 2017-02-19 LAB — VITAMIN D 25 HYDROXY (VIT D DEFICIENCY, FRACTURES): VITD: 33.63 ng/mL (ref 30.00–100.00)

## 2017-02-19 LAB — MICROALBUMIN / CREATININE URINE RATIO
CREATININE, U: 101.1 mg/dL
Microalb Creat Ratio: 0.7 mg/g (ref 0.0–30.0)

## 2017-02-19 LAB — FERRITIN: Ferritin: 84.5 ng/mL (ref 10.0–291.0)

## 2017-02-19 LAB — TSH: TSH: 1.61 u[IU]/mL (ref 0.35–4.50)

## 2017-02-19 LAB — HEMOGLOBIN A1C: HEMOGLOBIN A1C: 6 % (ref 4.6–6.5)

## 2017-02-19 LAB — SEDIMENTATION RATE: SED RATE: 20 mm/h (ref 0–30)

## 2017-02-19 LAB — IRON: Iron: 68 ug/dL (ref 42–145)

## 2017-02-19 MED ORDER — LEVOTHYROXINE SODIUM 100 MCG PO TABS: 100.0000 ug | ORAL_TABLET | Freq: Every day | ORAL | 1 refills | Status: DC

## 2017-02-19 MED ORDER — LISINOPRIL-HYDROCHLOROTHIAZIDE 20-25 MG PO TABS: 1.0000 | ORAL_TABLET | Freq: Every day | ORAL | 3 refills | Status: DC

## 2017-02-19 NOTE — Assessment & Plan Note (Signed)
Repeating today ,  History of secondary polycythemia

## 2017-02-19 NOTE — Assessment & Plan Note (Signed)
Will refill levothyroxine pending evaluation of tsh

## 2017-02-19 NOTE — Assessment & Plan Note (Signed)
By report from Dr Juanita Craver in 2008.  Patient has deferred earlier follow up, now willing to have repeat clonoscopy if done in [place]. refer to [facility].

## 2017-02-19 NOTE — Assessment & Plan Note (Signed)
Elevated today  On lisinopril 10/12.5.  Will change medication to losartan hct pending evaluation of labs.

## 2017-02-19 NOTE — Assessment & Plan Note (Signed)
She notes excessive worry, particularly about her aging beloved dog.  Some compulsiveness reported,  Discussed change from prozac to zoloft 100 mg daily.  And prn alprazolam for anxiety related insomnia. The risks and benefits of benzodiazepine use were discussed with patient today including excessive sedation leading to respiratory depression,  impaired thinking/driving, and addiction.  Patient was advised to avoid concurrent use with alcohol, to use medication only as needed and not to share with others  .

## 2017-02-19 NOTE — Assessment & Plan Note (Signed)
Previously managed with placquenil and celebrex by Dr Jefm Bryant  Checking ESR today . Resume celebrex pending assessment of renal function

## 2017-02-19 NOTE — Assessment & Plan Note (Signed)
Historically well-controlled on diet alone given hemoglobin A1c which has been consistently at or  less than 6.0 . Patient is up-to-date on eye exams and foot exam was normal today   Patient is overdue for labs and has no microalbuminuria. Patient has avoided  statin therapy for CAD risk reduction but has tolerated  ACE/ARB for renal protection and hypertension

## 2017-03-06 ENCOUNTER — Other Ambulatory Visit: Payer: Self-pay | Admitting: Internal Medicine

## 2017-04-03 ENCOUNTER — Ambulatory Visit: Payer: Self-pay | Admitting: Internal Medicine

## 2017-04-05 ENCOUNTER — Telehealth: Payer: Self-pay | Admitting: *Deleted

## 2017-04-05 MED ORDER — LISINOPRIL-HYDROCHLOROTHIAZIDE 20-25 MG PO TABS: 1.0000 | ORAL_TABLET | Freq: Every day | ORAL | 3 refills | Status: DC

## 2017-04-05 MED ORDER — GABAPENTIN 300 MG PO CAPS: ORAL_CAPSULE | ORAL | 1 refills | Status: DC

## 2017-04-05 NOTE — Telephone Encounter (Signed)
Patient has requested to have lisinopril and gabapentin  Pharmacy [facility] in [place]

## 2017-04-05 NOTE — Telephone Encounter (Signed)
[facility] for patient to return call.  Also advised to speak with [facility] if I am out of the office.

## 2017-04-05 NOTE — Telephone Encounter (Signed)
Medications have been sent

## 2017-04-09 NOTE — Telephone Encounter (Signed)
On 04/05/17 spoke with the patient.  Asked what issues with her refills were this am.  She stated that her pharmacy stated that they had been sending over requests to our office and that we responded with "the patient is not our patient".  I researched in the chart.  We have received no requests from the pharmacy and I explained that to the patient.  The only requests we had were from her.  The medications were filled this morning by the CMA and the patient said of course they were because I talked to the office manager and she was going to take care of them.  I went back to the Engineer, building services and the [place] office team lead and discussed concerns.  This will be addressed. thanks

## 2017-04-24 ENCOUNTER — Ambulatory Visit (INDEPENDENT_AMBULATORY_CARE_PROVIDER_SITE_OTHER): Payer: Medicare HMO

## 2017-04-24 VITALS — BP 128/62 | HR 65 | Temp 98.5°F | Resp 12 | Ht 65.0 in | Wt 147.0 lb

## 2017-04-24 DIAGNOSIS — Z0000 Encounter for general adult medical examination without abnormal findings: Secondary | ICD-10-CM | POA: Diagnosis not present

## 2017-04-24 NOTE — Patient Instructions (Addendum)
  [name] Day , Thank you for taking time to come for your Medicare Wellness Visit. I appreciate your ongoing commitment to your health goals. Please review the following plan we discussed and let me know if I can assist you in the future.   Follow up with Dr. Derrel Nip as needed.    Bring a copy of your [place] and/or Living Will to be scanned into chart.  Have a great day!  These are the goals we discussed: Goals    . Healthy Lifestyle          Low carb foods Stay hydrated Stay active Stretch       This is a list of the screening recommended for you and due dates:  Health Maintenance  Topic Date Due  . Complete foot exam   03/10/2015  . Colon Cancer Screening  04/17/2017  . Flu Shot  04/17/2017  . Eye exam for diabetics  08/01/2017  . Hemoglobin A1C  08/20/2017  . Mammogram  05/10/2018  . Tetanus Vaccine  10/06/2024  . DEXA scan (bone density measurement)  Completed  .  Hepatitis C: One time screening is recommended by Center for Disease Control  (CDC) for  adults born from [birth year] through 1964.   Completed  . Pneumonia vaccines  Completed

## 2017-04-24 NOTE — Progress Notes (Signed)
Subjective:   [name] Day is a 71 y.o. female who presents for Medicare Annual (Subsequent) preventive examination.  Review of Systems:  No ROS.  Medicare Wellness Visit. Additional risk factors are reflected in the social history.  Cardiac Risk Factors include: advanced age (>21men, >18 women);diabetes mellitus;hypertension     Objective:     Vitals: BP 128/62 (BP Location: Left Arm, Patient Position: Sitting, Cuff Size: Normal)   Pulse 65   Temp 98.5 F (36.9 C) (Oral)   Resp 12   Ht 5\' 5"  (1.651 m)   Wt 147 lb (66.7 kg)   SpO2 98%   BMI 24.46 kg/m   Body mass index is 24.46 kg/m.   Tobacco History  Smoking Status  . Former Smoker  . Packs/day: 0.50  . Years: 25.00  . Types: Cigarettes  . Quit date: 09/17/1993  Smokeless Tobacco  . Never Used     Counseling given: Not Answered   Past Medical History:  Diagnosis Date  . allergic rhinitis   . Arthritis    hands  . Cancer ([place])    skin  . COPD (chronic obstructive pulmonary disease) ([place])    PFTS 2013 FEV1 nearly normalized  . Depression   . Diabetes mellitus    diet controlled  . GERD (gastroesophageal reflux disease)   . Headache    sinus  . Hyperlipidemia   . Hypertension   . Hypothyroid 2002  . Lichen sclerosus et atrophicus of the vulva 01/2011  . Neuropathy    diabetic  . Raynauds disease   . Rheumatic fever    HX   Past Surgical History:  Procedure Laterality Date  . ABDOMINAL HYSTERECTOMY  1978  . BACK SURGERY  2000   repair cord damage after 1st back surgery  . COLONOSCOPY  2004  . ETHMOIDECTOMY Bilateral 01/24/2016   Procedure: TOTAL ETHMOIDECTOMY BILATERAL;  Surgeon: Clyde Canterbury, MD;  Location: [facility];  Service: ENT;  Laterality: Bilateral;  . FRONTAL SINUS EXPLORATION Bilateral 01/24/2016   Procedure: BILATERAL FRONTAL SINUSOTOMY;  Surgeon: Clyde Canterbury, MD;  Location: [facility];  Service: ENT;  Laterality: Bilateral;  Latex sensitivity  . IMAGE GUIDED  SINUS SURGERY N/A 01/24/2016   Procedure: IMAGE GUIDED SINUS SURGERY;  Surgeon: Clyde Canterbury, MD;  Location: [facility];  Service: ENT;  Laterality: N/A;  GAVE DISK TO [facility] 3/27  . MAXILLARY ANTROSTOMY Bilateral 01/24/2016   Procedure: MAXILLARY ANTROSTOMY BILATERAL ENDOSCOPICWITH TISSUE REMOVAL;  Surgeon: Clyde Canterbury, MD;  Location: [facility];  Service: ENT;  Laterality: Bilateral;  . SEPTOPLASTY N/A 01/24/2016   Procedure: SEPTOPLASTY NASAL;  Surgeon: Clyde Canterbury, MD;  Location: [facility];  Service: ENT;  Laterality: N/A;  . SPINE SURGERY     L3 to L5  . TONSILLECTOMY     Family History  Problem Relation Age of Onset  . Heart disease Mother        CHF and cardiomyopathy  . Thyroid disease Mother   . Hyperlipidemia Mother   . Hyperlipidemia Father   . Cancer Father        liver  . Lupus Sister   . Hyperlipidemia Sister   . Breast cancer Neg Hx    History  Sexual Activity  . Sexual activity: Not Currently    Outpatient Encounter Prescriptions as of 04/24/2017  Medication Sig  . albuterol (PROAIR HFA) 108 (90 BASE) MCG/ACT inhaler Inhale 2 puffs into the lungs every 4 (four) hours as needed for wheezing.  Marland Kitchen  ALPRAZolam (XANAX) 0.25 MG tablet Take 1 tablet (0.25 mg total) by mouth at bedtime as needed.  . cholecalciferol (VITAMIN D) 400 UNITS TABS tablet Take 400 Units by mouth.  . fexofenadine (ALLEGRA) 180 MG tablet Take 180 mg by mouth daily.  Marland Kitchen gabapentin (NEURONTIN) 300 MG capsule TAKE 1 CAPSULE BY MOUTH WITH BREAKFAST AND LUNCH, AND 2 CAPSULES EVERY NIGHT AT BEDTIME  . lactobacillus acidophilus (BACID) TABS tablet Take 1 tablet by mouth daily.  Marland Kitchen levothyroxine (SYNTHROID, LEVOTHROID) 100 MCG tablet Take 1 tablet (100 mcg total) by mouth daily.  Marland Kitchen lisinopril-hydrochlorothiazide (PRINZIDE,ZESTORETIC) 20-25 MG tablet Take 1 tablet by mouth daily.  . Multiple Vitamins-Minerals (SENIOR MULTIVITAMIN PLUS) TABS Take 1 tablet by mouth.    . sertraline (ZOLOFT)  100 MG tablet Take 1 tablet (100 mg total) by mouth daily.  . [DISCONTINUED] FLUoxetine (PROZAC) 40 MG capsule TAKE 1 CAPSULE BY MOUTH EVERY DAY  . [DISCONTINUED] Fluticasone-Salmeterol (ADVAIR) 100-50 MCG/DOSE AEPB Inhale 1 puff into the lungs 2 (two) times daily as needed.  . [DISCONTINUED] lisinopril-hydrochlorothiazide (PRINZIDE,ZESTORETIC) 10-12.5 MG tablet TAKE 1 TABLET BY MOUTH DAILY   No facility-administered encounter medications on file as of 04/24/2017.     Activities of Daily Living In your present state of health, do you have any difficulty performing the following activities: 04/24/2017 04/24/2016  Hearing? N N  Vision? N N  Difficulty concentrating or making decisions? N N  Walking or climbing stairs? N N  Dressing or bathing? N N  Doing errands, shopping? N N  Preparing Food and eating ? N N  Using the Toilet? N N  In the past six months, have you accidently leaked urine? N N  Do you have problems with loss of bowel control? N N  Managing your Medications? N N  Managing your Finances? N N  Housekeeping or managing your Housekeeping? N N  Some recent data might be hidden    Patient Care Team: Crecencio Mc, MD as PCP - General (Internal Medicine)    Assessment:    This is a routine wellness examination for [name] Day. The goal of the wellness visit is to assist the patient how to close the gaps in care and create a preventative care plan for the patient.   The roster of all physicians providing medical care to patient is listed in the Snapshot section of the chart.  Taking calcium VIT D as appropriate/Osteoporosis risk reviewed.    Safety issues reviewed; Smoke and carbon monoxide detectors in the home. No firearms in the home.  Wears seatbelts when driving or riding with others. Patient does wear sunscreen or protective clothing when in direct sunlight. No violence in the home.  Patient is alert, normal appearance, oriented to person/place/and time.  Correctly  identified the president of the [place], recall of 3/3 words, and performing simple calculations. Displays appropriate judgement and can read correct time from watch face.   No new identified risk were noted.  No failures at ADL's or IADL's.   BMI- discussed the importance of a healthy diet, water intake and the benefits of aerobic exercise. Educational material provided.   24 hour diet recall: Low carb foods  Daily fluid intake: 0 cups of caffeine, 6 cups of water  Eye- Visual acuity not assessed per patient preference since they have regular follow up with the ophthalmologist.  Wears corrective lenses.  Sleep patterns- Sleeps 4 hours at night.  Naps during the day as needed.  Patient Concerns: Requests celebrex for inflammatory polyarthritis.  Deferred to PCP for follow up.  Exercise Activities and Dietary recommendations Current Exercise Habits: Home exercise routine, Type of exercise: walking, Time (Minutes): 20, Frequency (Times/Week): 4, Weekly Exercise (Minutes/Week): 80, Intensity: Moderate  Goals    . Healthy Lifestyle          Low carb foods Stay hydrated Stay active Stretch      Fall Risk Fall Risk  04/24/2017 04/24/2016 10/24/2015 01/16/2015 01/13/2015  Falls in the past year? No No No No Yes  Number falls in past yr: - - - - 1  Injury with Fall? - - - - No   Depression Screen PHQ 2/9 Scores 04/24/2017 04/24/2016 10/24/2015 01/16/2015  PHQ - 2 Score 0 0 0 0  PHQ- 9 Score - - - -     Cognitive Function MMSE - Mini Mental State Exam 04/24/2017 04/24/2016  Orientation to time 5 5  Orientation to Place 5 5  Registration 3 3  Attention/ Calculation 5 5  Recall 3 3  Language- name 2 objects 2 2  Language- repeat 1 1  Language- follow 3 step command 3 3  Language- read & follow direction 1 1  Write a sentence 1 1  Copy design 1 1  Total score 30 30        Immunization History  Administered Date(s) Administered  . Influenza Split 07/11/2014  . Influenza Whole 05/29/2008,  05/14/2011, 07/11/2012  . Influenza, High Dose Seasonal PF 10/24/2015  . Influenza,inj,Quad PF,36+ Mos 06/16/2013  . Influenza-Unspecified 05/18/2014  . Pneumococcal Conjugate-13 11/05/2013  . Pneumococcal Polysaccharide-23 08/28/2010, 10/24/2015  . Td 09/24/2003, 10/06/2014   Screening Tests Health Maintenance  Topic Date Due  . FOOT EXAM  03/10/2015  . COLONOSCOPY  04/17/2017  . INFLUENZA VACCINE  04/17/2017  . OPHTHALMOLOGY EXAM  08/01/2017  . HEMOGLOBIN A1C  08/20/2017  . MAMMOGRAM  05/10/2018  . TETANUS/TDAP  10/06/2024  . DEXA SCAN  Completed  . Hepatitis C Screening  Completed  . PNA vac Low Risk Adult  Completed      Plan:    End of life planning; Advance aging; Advanced directives discussed. Copy of current HCPOA/Living Will requested.    I have personally reviewed and noted the following in the patient's chart:   . Medical and social history . Use of alcohol, tobacco or illicit drugs  . Current medications and supplements . Functional ability and status . Nutritional status . Physical activity . Advanced directives . List of other physicians . Hospitalizations, surgeries, and ER visits in previous 12 months . Vitals . Screenings to include cognitive, depression, and falls . Referrals and appointments  In addition, I have reviewed and discussed with patient certain preventive protocols, quality metrics, and best practice recommendations. A written personalized care plan for preventive services as well as general preventive health recommendations were provided to patient.     OBrien-Blaney, Taleeya Blondin L, LPN  09/23/7114   I have reviewed the above information and agree with above.   Deborra Medina, MD

## 2017-06-12 NOTE — Telephone Encounter (Signed)
Error

## 2017-06-27 ENCOUNTER — Other Ambulatory Visit: Payer: Self-pay | Admitting: Internal Medicine

## 2017-07-01 ENCOUNTER — Ambulatory Visit (INDEPENDENT_AMBULATORY_CARE_PROVIDER_SITE_OTHER): Payer: Medicare HMO | Admitting: Family

## 2017-07-01 ENCOUNTER — Encounter: Payer: Self-pay | Admitting: Family

## 2017-07-01 VITALS — BP 140/70 | HR 75 | Temp 98.7°F | Ht 65.0 in | Wt 150.0 lb

## 2017-07-01 DIAGNOSIS — B029 Zoster without complications: Secondary | ICD-10-CM | POA: Diagnosis not present

## 2017-07-01 MED ORDER — VALACYCLOVIR HCL 1 G PO TABS: 1000.0000 mg | ORAL_TABLET | Freq: Three times a day (TID) | ORAL | 0 refills | Status: DC

## 2017-07-01 NOTE — Progress Notes (Signed)
Pre visit review using our clinic review tool, if applicable. No additional management support is needed unless otherwise documented below in the visit note. 

## 2017-07-01 NOTE — Progress Notes (Signed)
Subjective:    Patient ID: SOPHIA SPERRY, female    DOB: [birth year], 71 y.o.   MRN: [id number]  CC: SHAKERRA RED is a 71 y.o. female who presents today for an acute visit.    HPI: CC: rash on left flank x 10 day, worsening  Does note had fever prior to rash however resolved, then rash started  Painful. No fever now, n,v. Otherwise feels well.  Not shingles vaccinated . Had shingles years ago.   No new lotions, creams    HISTORY:  Past Medical History:  Diagnosis Date  . allergic rhinitis   . Arthritis    hands  . Cancer ([place])    skin  . COPD (chronic obstructive pulmonary disease) ([place])    PFTS 2013 FEV1 nearly normalized  . Depression   . Diabetes mellitus    diet controlled  . GERD (gastroesophageal reflux disease)   . Headache    sinus  . Hyperlipidemia   . Hypertension   . Hypothyroid 2002  . Lichen sclerosus et atrophicus of the vulva 01/2011  . Neuropathy    diabetic  . Raynauds disease   . Rheumatic fever    HX   Past Surgical History:  Procedure Laterality Date  . ABDOMINAL HYSTERECTOMY  1978  . BACK SURGERY  2000   repair cord damage after 1st back surgery  . COLONOSCOPY  2004  . ETHMOIDECTOMY Bilateral 01/24/2016   Procedure: TOTAL ETHMOIDECTOMY BILATERAL;  Surgeon: Clyde Canterbury, MD;  Location: [facility];  Service: ENT;  Laterality: Bilateral;  . FRONTAL SINUS EXPLORATION Bilateral 01/24/2016   Procedure: BILATERAL FRONTAL SINUSOTOMY;  Surgeon: Clyde Canterbury, MD;  Location: [facility];  Service: ENT;  Laterality: Bilateral;  Latex sensitivity  . IMAGE GUIDED SINUS SURGERY N/A 01/24/2016   Procedure: IMAGE GUIDED SINUS SURGERY;  Surgeon: Clyde Canterbury, MD;  Location: [facility];  Service: ENT;  Laterality: N/A;  GAVE DISK TO [facility] 3/27  . MAXILLARY ANTROSTOMY Bilateral 01/24/2016   Procedure: MAXILLARY ANTROSTOMY BILATERAL ENDOSCOPICWITH TISSUE REMOVAL;  Surgeon: Clyde Canterbury, MD;  Location: [facility];  Service:  ENT;  Laterality: Bilateral;  . SEPTOPLASTY N/A 01/24/2016   Procedure: SEPTOPLASTY NASAL;  Surgeon: Clyde Canterbury, MD;  Location: [facility];  Service: ENT;  Laterality: N/A;  . SPINE SURGERY     L3 to L5  . TONSILLECTOMY     Family History  Problem Relation Age of Onset  . Heart disease Mother        CHF and cardiomyopathy  . Thyroid disease Mother   . Hyperlipidemia Mother   . Hyperlipidemia Father   . Cancer Father        liver  . Lupus Sister   . Hyperlipidemia Sister   . Breast cancer Neg Hx     Allergies: Aspirin; Statins; Codeine; and Latex Current Outpatient Prescriptions on File Prior to Visit  Medication Sig Dispense Refill  . albuterol (PROAIR HFA) 108 (90 BASE) MCG/ACT inhaler Inhale 2 puffs into the lungs every 4 (four) hours as needed for wheezing. 1 Inhaler 2  . ALPRAZolam (XANAX) 0.25 MG tablet Take 1 tablet (0.25 mg total) by mouth at bedtime as needed. 30 tablet 2  . cholecalciferol (VITAMIN D) 400 UNITS TABS tablet Take 400 Units by mouth.    . fexofenadine (ALLEGRA) 180 MG tablet Take 180 mg by mouth daily.    Marland Kitchen gabapentin (NEURONTIN) 300 MG capsule TAKE 1 CAPSULE BY MOUTH WITH BREAKFAST AND LUNCH, AND 2  CAPSULES EVERY NIGHT AT BEDTIME 120 capsule 1  . lactobacillus acidophilus (BACID) TABS tablet Take 1 tablet by mouth daily.    Marland Kitchen levothyroxine (SYNTHROID, LEVOTHROID) 100 MCG tablet Take 1 tablet (100 mcg total) by mouth daily. 90 tablet 1  . lisinopril-hydrochlorothiazide (PRINZIDE,ZESTORETIC) 20-25 MG tablet Take 1 tablet by mouth daily. 90 tablet 3  . Multiple Vitamins-Minerals (SENIOR MULTIVITAMIN PLUS) TABS Take 1 tablet by mouth.      . sertraline (ZOLOFT) 100 MG tablet TAKE 1 TABLET(100 MG) BY MOUTH DAILY 90 tablet 3   No current facility-administered medications on file prior to visit.     Social History  Substance Use Topics  . Smoking status: Former Smoker    Packs/day: 0.50    Years: 25.00    Types: Cigarettes    Quit date: 09/17/1993    . Smokeless tobacco: Never Used  . Alcohol use No    Review of Systems  Constitutional: Negative for chills and fever.  Respiratory: Negative for cough.   Cardiovascular: Negative for chest pain and palpitations.  Gastrointestinal: Negative for nausea and vomiting.  Skin: Positive for rash.      Objective:    BP 140/70   Pulse 75   Temp 98.7 F (37.1 C) (Oral)   Ht 5\' 5"  (1.651 m)   Wt 150 lb (68 kg)   SpO2 95%   BMI 24.96 kg/m    Physical Exam  Constitutional: She appears well-developed and well-nourished.  Eyes: Conjunctivae are normal.  Cardiovascular: Normal rate, regular rhythm, normal heart sounds and normal pulses.   Pulmonary/Chest: Effort normal and breath sounds normal. She has no wheezes. She has no rhonchi. She has no rales.  Neurological: She is alert.  Skin: Skin is warm and dry. Rash noted. Rash is vesicular.     Vesicular lesions noted in dermatomal pattern left low back as marked on diagram. No drainage, increased warmth.  Psychiatric: She has a normal mood and affect. Her speech is normal and behavior is normal. Thought content normal.  Vitals reviewed.      Assessment & Plan:  1. Herpes zoster without complication Afebrile. Patient's well-appearing. Symptoms most consistent with zoster. Will treat with valtrex.  Return precautions given. - valACYclovir (VALTREX) 1000 MG tablet; Take 1 tablet (1,000 mg total) by mouth 3 (three) times daily.  Dispense: 21 tablet; Refill: 0     I am having Ms. Galati start on valACYclovir. I am also having her maintain her SENIOR MULTIVITAMIN PLUS, fexofenadine, albuterol, cholecalciferol, lactobacillus acidophilus, ALPRAZolam, levothyroxine, gabapentin, lisinopril-hydrochlorothiazide, and sertraline.   Meds ordered this encounter  Medications  . valACYclovir (VALTREX) 1000 MG tablet    Sig: Take 1 tablet (1,000 mg total) by mouth 3 (three) times daily.    Dispense:  21 tablet    Refill:  0    Order  Specific Question:   Supervising Provider    Answer:   Crecencio Mc [[id number]]    Return precautions given.   Risks, benefits, and alternatives of the medications and treatment plan prescribed today were discussed, and patient expressed understanding.   Education regarding symptom management and diagnosis given to patient on AVS.  Continue to follow with Crecencio Mc, MD for routine health maintenance.   Ardis Hughs and I agreed with plan.   Mable Paris, FNP

## 2017-07-01 NOTE — Patient Instructions (Signed)
Start valtrex  Shingrex series once rash has resolved  Let me know if not better   Shingles Shingles, which is also known as herpes zoster, is an infection that causes a painful skin rash and fluid-filled blisters. Shingles is not related to genital herpes, which is a sexually transmitted infection. Shingles only develops in people who:  Have had chickenpox.  Have received the chickenpox vaccine. (This is rare.)  What are the causes? Shingles is caused by varicella-zoster virus (VZV). This is the same virus that causes chickenpox. After exposure to VZV, the virus stays in the body in an inactive (dormant) state. Shingles develops if the virus reactivates. This can happen many years after the initial exposure to VZV. It is not known what causes this virus to reactivate. What increases the risk? People who have had chickenpox or received the chickenpox vaccine are at risk for shingles. Infection is more common in people who:  Are older than age 57.  Have a weakened defense (immune) system, such as those with HIV, AIDS, or cancer.  Are taking medicines that weaken the immune system, such as transplant medicines.  Are under great stress.  What are the signs or symptoms? Early symptoms of this condition include itching, tingling, and pain in an area on your skin. Pain may be described as burning, stabbing, or throbbing. A few days or weeks after symptoms start, a painful red rash appears, usually on one side of the body in a bandlike or beltlike pattern. The rash eventually turns into fluid-filled blisters that break open, scab over, and dry up in about 2-3 weeks. At any time during the infection, you may also develop:  A fever.  Chills.  A headache.  An upset stomach.  How is this diagnosed? This condition is diagnosed with a skin exam. Sometimes, skin or fluid samples are taken from the blisters before a diagnosis is made. These samples are examined under a microscope or sent  to a lab for testing. How is this treated? There is no specific cure for this condition. Your health care provider will probably prescribe medicines to help you manage pain, recover more quickly, and avoid long-term problems. Medicines may include:  Antiviral drugs.  Anti-inflammatory drugs.  Pain medicines.  If the area involved is on your face, you may be referred to a specialist, such as an eye doctor (ophthalmologist) or an ear, nose, and throat (ENT) doctor to help you avoid eye problems, chronic pain, or disability. Follow these instructions at home: Medicines  Take medicines only as directed by your health care provider.  Apply an anti-itch or numbing cream to the affected area as directed by your health care provider. Blister and Rash Care  Take a cool bath or apply cool compresses to the area of the rash or blisters as directed by your health care provider. This may help with pain and itching.  Keep your rash covered with a loose bandage (dressing). Wear loose-fitting clothing to help ease the pain of material rubbing against the rash.  Keep your rash and blisters clean with mild soap and cool water or as directed by your health care provider.  Check your rash every day for signs of infection. These include redness, swelling, and pain that lasts or increases.  Do not pick your blisters.  Do not scratch your rash. General instructions  Rest as directed by your health care provider.  Keep all follow-up visits as directed by your health care provider. This is important.  Until your  blisters scab over, your infection can cause chickenpox in people who have never had it or been vaccinated against it. To prevent this from happening, avoid contact with other people, especially: ? Babies. ? Pregnant women. ? Children who have eczema. ? Elderly people who have transplants. ? People who have chronic illnesses, such as leukemia or AIDS. Contact a health care provider  if:  Your pain is not relieved with prescribed medicines.  Your pain does not get better after the rash heals.  Your rash looks infected. Signs of infection include redness, swelling, and pain that lasts or increases. Get help right away if:  The rash is on your face or nose.  You have facial pain, pain around your eye area, or loss of feeling on one side of your face.  You have ear pain or you have ringing in your ear.  You have loss of taste.  Your condition gets worse. This information is not intended to replace advice given to you by your health care provider. Make sure you discuss any questions you have with your health care provider. Document Released: 09/03/2005 Document Revised: 04/29/2016 Document Reviewed: 07/15/2014 Elsevier Interactive Patient Education  2017 [employer].

## 2017-08-01 ENCOUNTER — Encounter: Payer: Self-pay | Admitting: Family

## 2017-08-01 MED ORDER — GABAPENTIN 300 MG PO CAPS: ORAL_CAPSULE | ORAL | 1 refills | Status: DC

## 2017-08-14 ENCOUNTER — Other Ambulatory Visit: Payer: Self-pay | Admitting: Internal Medicine

## 2017-08-19 ENCOUNTER — Other Ambulatory Visit: Payer: Self-pay | Admitting: Internal Medicine

## 2017-08-19 MED ORDER — LEVOTHYROXINE SODIUM 100 MCG PO TABS: 100.0000 ug | ORAL_TABLET | Freq: Every day | ORAL | 0 refills | Status: DC

## 2017-10-16 ENCOUNTER — Other Ambulatory Visit: Payer: Self-pay | Admitting: Internal Medicine

## 2017-10-16 ENCOUNTER — Telehealth: Payer: Self-pay

## 2017-10-16 DIAGNOSIS — Z1239 Encounter for other screening for malignant neoplasm of breast: Secondary | ICD-10-CM

## 2017-10-16 NOTE — Telephone Encounter (Signed)
Copied from [place] [id number]. Topic: Referral - Request >> Oct 16, 2017  8:39 AM Aurelio Brash B wrote: Reason for CRM: Pt is requesting a referral for a dx mammogram - She is wanting to have the mammogram at [facility]  at [facility]

## 2017-10-16 NOTE — Telephone Encounter (Signed)
Please advise 

## 2017-10-17 NOTE — Telephone Encounter (Signed)
Ordered i

## 2017-10-23 ENCOUNTER — Telehealth: Payer: Self-pay | Admitting: Internal Medicine

## 2017-10-23 ENCOUNTER — Telehealth: Payer: Self-pay

## 2017-10-23 DIAGNOSIS — N644 Mastodynia: Secondary | ICD-10-CM

## 2017-10-23 DIAGNOSIS — N6452 Nipple discharge: Secondary | ICD-10-CM

## 2017-10-23 NOTE — Telephone Encounter (Signed)
Spoke with [facility] and they stated that the pt told them that she is having left breast pain, and nipple discharge. The diagnostic mammogram and [place] have been ordered they just need to be signed off on.

## 2017-10-23 NOTE — Telephone Encounter (Signed)
Copied from [place] [phone]. Topic: General - Other >> Oct 23, 2017  9:09 AM Lolita Rieger, RMA wrote: Reason for CRM: pt called and and stated that [facility] informed her that she needed a diagnostic mammogram and that the order needed to be changed in order for her to schedule an appointment Pt would like a call once this has been changed

## 2017-10-23 NOTE — Telephone Encounter (Signed)
Duplicate encounter

## 2017-10-23 NOTE — Telephone Encounter (Signed)
Copied from [place] [phone]. Topic: General - Other >> Oct 23, 2017  9:09 AM Lolita Rieger, RMA wrote: Reason for CRM: pt called and and stated that [facility] informed her that she needed a diagnostic mammogram and that the order needed to be changed in order for her to schedule an appointment Pt would like a call once this has been changed

## 2017-10-30 ENCOUNTER — Ambulatory Visit
Admission: RE | Admit: 2017-10-30 | Discharge: 2017-10-30 | Disposition: A | Discharge status: Home / Self Care | Payer: Medicare HMO | Source: Ambulatory Visit | Attending: Internal Medicine | Admitting: Internal Medicine

## 2017-10-30 ENCOUNTER — Encounter: Payer: Self-pay | Admitting: Internal Medicine

## 2017-10-30 DIAGNOSIS — N6452 Nipple discharge: Secondary | ICD-10-CM | POA: Diagnosis not present

## 2017-10-30 DIAGNOSIS — N644 Mastodynia: Secondary | ICD-10-CM

## 2017-10-30 DIAGNOSIS — N6489 Other specified disorders of breast: Secondary | ICD-10-CM | POA: Diagnosis not present

## 2017-10-30 DIAGNOSIS — R928 Other abnormal and inconclusive findings on diagnostic imaging of breast: Secondary | ICD-10-CM | POA: Insufficient documentation

## 2017-10-30 DIAGNOSIS — R922 Inconclusive mammogram: Secondary | ICD-10-CM | POA: Diagnosis not present

## 2017-10-31 MED ORDER — GABAPENTIN 300 MG PO CAPS: ORAL_CAPSULE | ORAL | 1 refills | Status: DC

## 2017-11-14 ENCOUNTER — Other Ambulatory Visit: Payer: Self-pay | Admitting: Internal Medicine

## 2017-11-14 MED ORDER — LEVOTHYROXINE SODIUM 100 MCG PO TABS: 100.0000 ug | ORAL_TABLET | Freq: Every day | ORAL | 0 refills | Status: DC

## 2017-12-26 ENCOUNTER — Other Ambulatory Visit: Payer: Self-pay | Admitting: Internal Medicine

## 2018-01-24 ENCOUNTER — Other Ambulatory Visit: Payer: Self-pay | Admitting: Internal Medicine

## 2018-01-24 MED ORDER — GABAPENTIN 300 MG PO CAPS: ORAL_CAPSULE | ORAL | 1 refills | Status: DC

## 2018-01-24 NOTE — Telephone Encounter (Signed)
Last OV 02/18/17 ok to fill?

## 2018-02-12 ENCOUNTER — Other Ambulatory Visit: Payer: Self-pay | Admitting: Internal Medicine

## 2018-02-13 MED ORDER — LEVOTHYROXINE SODIUM 100 MCG PO TABS: 100.0000 ug | ORAL_TABLET | Freq: Every day | ORAL | 0 refills | Status: DC

## 2018-02-13 NOTE — Telephone Encounter (Signed)
Refill for 90 days OFFICE VISIT NEEDED prior to any more refills

## 2018-02-13 NOTE — Telephone Encounter (Signed)
Refilled: 11/14/2017 Last OV: 07/01/2017 Next OV: not scheduled Last TSH: 02/18/2017

## 2018-03-14 ENCOUNTER — Telehealth: Payer: Self-pay | Admitting: Internal Medicine

## 2018-03-14 NOTE — Telephone Encounter (Signed)
Copied from [place] [id number]. Topic: Quick Communication - Rx Refill/Question >> Mar 14, 2018  8:53 AM Synthia Innocent wrote: Medication: lisinopril-hydrochlorothiazide (PRINZIDE,ZESTORETIC) 20-25 MG tablet   Has the patient contacted their pharmacy?  (Agent: If no, request that the patient contact the pharmacy for the refill.) (Agent: If yes, when and what did the pharmacy advise?)  Preferred Pharmacy (with phone number or street name): [facility] [phone]  Agent: Please be advised that RX refills may take up to 3 business days. We ask that you follow-up with your pharmacy.

## 2018-03-15 MED ORDER — LISINOPRIL-HYDROCHLOROTHIAZIDE 20-25 MG PO TABS: 1.0000 | ORAL_TABLET | Freq: Every day | ORAL | 0 refills | Status: DC

## 2018-04-25 ENCOUNTER — Ambulatory Visit: Payer: Medicare HMO

## 2018-04-28 ENCOUNTER — Ambulatory Visit (INDEPENDENT_AMBULATORY_CARE_PROVIDER_SITE_OTHER): Payer: Medicare HMO

## 2018-04-28 ENCOUNTER — Ambulatory Visit (INDEPENDENT_AMBULATORY_CARE_PROVIDER_SITE_OTHER): Payer: Medicare HMO | Admitting: Internal Medicine

## 2018-04-28 ENCOUNTER — Encounter: Payer: Self-pay | Admitting: Internal Medicine

## 2018-04-28 VITALS — BP 130/74 | HR 88 | Temp 98.4°F | Resp 16 | Ht 65.0 in | Wt 152.8 lb

## 2018-04-28 DIAGNOSIS — J988 Other specified respiratory disorders: Secondary | ICD-10-CM

## 2018-04-28 DIAGNOSIS — J069 Acute upper respiratory infection, unspecified: Secondary | ICD-10-CM | POA: Diagnosis not present

## 2018-04-28 DIAGNOSIS — D751 Secondary polycythemia: Secondary | ICD-10-CM | POA: Diagnosis not present

## 2018-04-28 DIAGNOSIS — E1140 Type 2 diabetes mellitus with diabetic neuropathy, unspecified: Secondary | ICD-10-CM | POA: Diagnosis not present

## 2018-04-28 DIAGNOSIS — M064 Inflammatory polyarthropathy: Secondary | ICD-10-CM

## 2018-04-28 DIAGNOSIS — E782 Mixed hyperlipidemia: Secondary | ICD-10-CM

## 2018-04-28 DIAGNOSIS — F411 Generalized anxiety disorder: Secondary | ICD-10-CM

## 2018-04-28 DIAGNOSIS — E034 Atrophy of thyroid (acquired): Secondary | ICD-10-CM

## 2018-04-28 DIAGNOSIS — I10 Essential (primary) hypertension: Secondary | ICD-10-CM | POA: Diagnosis not present

## 2018-04-28 LAB — CBC WITH DIFFERENTIAL/PLATELET
BASOS ABS: 0.1 10*3/uL (ref 0.0–0.1)
Basophils Relative: 0.6 % (ref 0.0–3.0)
EOS ABS: 0.1 10*3/uL (ref 0.0–0.7)
Eosinophils Relative: 1.2 % (ref 0.0–5.0)
HCT: 44 % (ref 36.0–46.0)
Hemoglobin: 14.9 g/dL (ref 12.0–15.0)
LYMPHS ABS: 1.6 10*3/uL (ref 0.7–4.0)
Lymphocytes Relative: 14.4 % (ref 12.0–46.0)
MCHC: 34 g/dL (ref 30.0–36.0)
MCV: 86.5 fl (ref 78.0–100.0)
MONOS PCT: 8.5 % (ref 3.0–12.0)
Monocytes Absolute: 1 10*3/uL (ref 0.1–1.0)
NEUTROS ABS: 8.6 10*3/uL — AB (ref 1.4–7.7)
NEUTROS PCT: 75.3 % (ref 43.0–77.0)
Platelets: 267 10*3/uL (ref 150.0–400.0)
RBC: 5.08 Mil/uL (ref 3.87–5.11)
RDW: 13.6 % (ref 11.5–15.5)
WBC: 11.4 10*3/uL — ABNORMAL HIGH (ref 4.0–10.5)

## 2018-04-28 LAB — LDL CHOLESTEROL, DIRECT: LDL DIRECT: 202 mg/dL

## 2018-04-28 LAB — HEMOGLOBIN A1C: HEMOGLOBIN A1C: 6 % (ref 4.6–6.5)

## 2018-04-28 LAB — LIPID PANEL
CHOL/HDL RATIO: 7
Cholesterol: 281 mg/dL — ABNORMAL HIGH (ref 0–200)
HDL: 42.5 mg/dL (ref >39.00)
NONHDL: 238.82
Triglycerides: 304 mg/dL — ABNORMAL HIGH (ref 0.0–149.0)
VLDL: 60.8 mg/dL — AB (ref 0.0–40.0)

## 2018-04-28 LAB — MICROALBUMIN / CREATININE URINE RATIO
Creatinine,U: 77.4 mg/dL
MICROALB UR: 0.7 mg/dL (ref 0.0–1.9)
Microalb Creat Ratio: 0.9 mg/g (ref 0.0–30.0)

## 2018-04-28 LAB — TSH: TSH: 3.09 u[IU]/mL (ref 0.35–4.50)

## 2018-04-28 LAB — SEDIMENTATION RATE: SED RATE: 25 mm/h (ref 0–30)

## 2018-04-28 LAB — C-REACTIVE PROTEIN: CRP: 0.8 mg/dL (ref 0.5–20.0)

## 2018-04-28 NOTE — Patient Instructions (Signed)
Let me know if you need referral to [facility] and x ray today  I  Will have a standing order for repeat labs when you have a flare
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
## 2018-04-29 ENCOUNTER — Other Ambulatory Visit (INDEPENDENT_AMBULATORY_CARE_PROVIDER_SITE_OTHER): Payer: Medicare HMO

## 2018-04-29 DIAGNOSIS — E1140 Type 2 diabetes mellitus with diabetic neuropathy, unspecified: Secondary | ICD-10-CM | POA: Diagnosis not present

## 2018-04-29 DIAGNOSIS — J988 Other specified respiratory disorders: Secondary | ICD-10-CM | POA: Insufficient documentation

## 2018-04-29 DIAGNOSIS — I10 Essential (primary) hypertension: Secondary | ICD-10-CM

## 2018-04-29 LAB — COMPREHENSIVE METABOLIC PANEL
ALT: 26 U/L (ref 0–35)
AST: 21 U/L (ref 0–37)
Albumin: 4.4 g/dL (ref 3.5–5.2)
Alkaline Phosphatase: 63 U/L (ref 39–117)
BUN: 24 mg/dL — AB (ref 6–23)
CHLORIDE: 99 meq/L (ref 96–112)
CO2: 30 meq/L (ref 19–32)
Calcium: 10.4 mg/dL (ref 8.4–10.5)
Creatinine, Ser: 0.84 mg/dL (ref 0.40–1.20)
GFR: 70.97 mL/min (ref >60.00)
GLUCOSE: 164 mg/dL — AB (ref 70–99)
POTASSIUM: 3.8 meq/L (ref 3.5–5.1)
SODIUM: 138 meq/L (ref 135–145)
Total Bilirubin: 0.4 mg/dL (ref 0.2–1.2)
Total Protein: 7.6 g/dL (ref 6.0–8.3)

## 2018-04-29 NOTE — Assessment & Plan Note (Addendum)
With aortic atherosclerosis noted on plain chest film.  Direct LDL today is 202.  She has type 2 DM as well and an aspirinand statin intolerance.    Will discuss therapy with Repatha at follow up    Lab Results  Component Value Date   CHOL 281 (H) 04/28/2018   HDL 42.50 04/28/2018   LDLCALC 81 03/08/2014   LDLDIRECT 202.0 04/28/2018   TRIG 304.0 (H) 04/28/2018   CHOLHDL 7 04/28/2018

## 2018-04-29 NOTE — Assessment & Plan Note (Signed)
Remains  well-controlled on diet alone, despite being lost to follow up.   hemoglobin A1c which has been consistently at or  less than 6.0 . Patient is up-to-date on eye exams and foot exam was normal today   Patient  has no microalbuminuria. Patient has avoided  statin therapy for CAD risk reduction but has tolerated  ACE/ARB for renal protection and hypertension   Lab Results  Component Value Date   HGBA1C 6.0 04/28/2018   Lab Results  Component Value Date   MICROALBUR 0.7 04/28/2018

## 2018-04-29 NOTE — Addendum Note (Signed)
Addended by: Crecencio Mc on: 04/29/2018 10:22 AM   Modules accepted: Orders

## 2018-04-29 NOTE — Assessment & Plan Note (Addendum)
Has been without treatment (Placquenil) for over 2 years due to loss of follow up with rheumatology.  Inflammatory markers are normal today,  Patient advised to return for rrepeat abs when she is having a flare to determine if her current pain complaints are OA vs IPA  Lab Results  Component Value Date   ESRSEDRATE 25 04/28/2018   Lab Results  Component Value Date   CRP 0.8 04/28/2018

## 2018-04-29 NOTE — Assessment & Plan Note (Signed)
TSH is therapeutic,  Continue current levothyoxine dose,  Recheck in 6 montths   Lab Results  Component Value Date   TSH 3.09 04/28/2018

## 2018-04-29 NOTE — Assessment & Plan Note (Signed)
Aggravted by recent events (loss of beloved dog to old age)  and new onset health concerns. Alprazolam refilled for prn use,  Will retur in one month after health issues investigated and recommend SSRI therapy

## 2018-04-29 NOTE — Assessment & Plan Note (Signed)
Last iron stores were normal in 2016 and hgb is normal today. Previous elevations attributed to smoking     Lab Results  Component Value Date   WBC 11.4 (H) 04/28/2018   HGB 14.9 04/28/2018   HCT 44.0 04/28/2018   MCV 86.5 04/28/2018   PLT 267.0 04/28/2018   Lab Results  Component Value Date   IRON 68 02/18/2017   TIBC 310 01/13/2015   FERRITIN 84.5 02/18/2017

## 2018-04-29 NOTE — Assessment & Plan Note (Signed)
Well controlled on current regimenof lisinopril/hctz for age. . Renal function is overdue ,  no changes today.  Lab Results  Component Value Date   CREATININE 0.81 02/18/2017   Lab Results  Component Value Date   NA 136 02/18/2017   K 3.9 02/18/2017   CL 100 02/18/2017   CO2 27 02/18/2017

## 2018-04-29 NOTE — Assessment & Plan Note (Signed)
ddx includes COPD,  Bronchiectasis,  Lung mass,  In an ex smoker.  Chest x ray today is negative for masses and infiltrates.  Will refer to Pulmonary

## 2018-04-30 ENCOUNTER — Other Ambulatory Visit: Payer: Self-pay

## 2018-04-30 MED ORDER — LISINOPRIL-HYDROCHLOROTHIAZIDE 20-25 MG PO TABS: 1.0000 | ORAL_TABLET | Freq: Every day | ORAL | 0 refills | Status: DC

## 2018-05-01 ENCOUNTER — Other Ambulatory Visit: Payer: Self-pay | Admitting: Internal Medicine

## 2018-05-01 DIAGNOSIS — D729 Disorder of white blood cells, unspecified: Secondary | ICD-10-CM

## 2018-05-01 NOTE — Progress Notes (Signed)
c 

## 2018-05-08 ENCOUNTER — Ambulatory Visit: Payer: Medicare HMO | Admitting: Pulmonary Disease

## 2018-05-08 ENCOUNTER — Encounter: Payer: Self-pay | Admitting: Pulmonary Disease

## 2018-05-08 ENCOUNTER — Other Ambulatory Visit
Admission: RE | Admit: 2018-05-08 | Discharge: 2018-05-08 | Disposition: A | Discharge status: Home / Self Care | Payer: Medicare HMO | Source: Ambulatory Visit | Attending: Pulmonary Disease | Admitting: Pulmonary Disease

## 2018-05-08 VITALS — BP 110/80 | HR 65 | Ht 65.0 in | Wt 151.0 lb

## 2018-05-08 DIAGNOSIS — Z87891 Personal history of nicotine dependence: Secondary | ICD-10-CM

## 2018-05-08 DIAGNOSIS — Z8709 Personal history of other diseases of the respiratory system: Secondary | ICD-10-CM | POA: Diagnosis not present

## 2018-05-08 DIAGNOSIS — R0609 Other forms of dyspnea: Secondary | ICD-10-CM | POA: Diagnosis not present

## 2018-05-08 DIAGNOSIS — R0601 Orthopnea: Secondary | ICD-10-CM

## 2018-05-08 DIAGNOSIS — R0600 Dyspnea, unspecified: Secondary | ICD-10-CM | POA: Insufficient documentation

## 2018-05-08 LAB — SEDIMENTATION RATE: Sed Rate: 6 mm/hr (ref 0–30)

## 2018-05-08 LAB — BRAIN NATRIURETIC PEPTIDE: B Natriuretic Peptide: 55 pg/mL (ref 0.0–100.0)

## 2018-05-08 MED ORDER — ALBUTEROL SULFATE HFA 108 (90 BASE) MCG/ACT IN AERS: 2.0000 | INHALATION_SPRAY | RESPIRATORY_TRACT | 2 refills | Status: DC | PRN

## 2018-05-08 NOTE — Patient Instructions (Addendum)
Blood test today: B natruretic peptide, ESR (sedimentation rate), hypersensitivity pneumonitis panel  Trial of Breo inhaler: 1 inhalation daily in the morning.  Rinse mouth thoroughly after use  Continue albuterol inhaler as needed for increased shortness of breath, chest tightness, wheezing, cough  Follow-up in 4 to 6 weeks with lung function tests (PFTs) prior to that visit

## 2018-05-12 ENCOUNTER — Other Ambulatory Visit: Payer: Self-pay | Admitting: Internal Medicine

## 2018-05-12 LAB — HYPERSENSITIVITY PNEUMONITIS
A. Pullulans Abs: NEGATIVE
A.Fumigatus #1 Abs: NEGATIVE
MICROPOLYSPORA FAENI IGG: NEGATIVE
PIGEON SERUM ABS: NEGATIVE
THERMOACTINOMYCES VULGARIS IGG: NEGATIVE
Thermoact. Saccharii: NEGATIVE

## 2018-05-14 ENCOUNTER — Encounter: Payer: Self-pay | Admitting: Pulmonary Disease

## 2018-05-14 NOTE — Progress Notes (Signed)
PULMONARY CONSULT NOTE  Requesting MD/Service: Derrel Nip Date of initial consultation: 05/08/18 Reason for consultation: Episodic respiratory and "flu-like" symptoms  PT PROFILE: 72 y.o. female former smoker (1 PPD x 29 yrs, quit 1994) with hx of asthma as a child and "inflammatory arthritis" referred for 6 mos of recurrent "flu-like" illness including cough and dyspnea  DATA: 2013 PFTs: normal   INTERVAL:  HPI:  As above. She reports a syndrome of cough, fatigue, malaise, dyspnea, chest pain occurring approximately every 4-5 weeks with symptoms lasting up to a week at a time. Her cough is NP. She has never had hemoptysis. She denies fever. She does note chest tightness and orthopnea. Also notes possible PND and occasional ankle edema. Albuterol helps her cough and dyspnea. On good days, she reports little or no significant dyspnea.   Past Medical History:  Diagnosis Date  . allergic rhinitis   . Arthritis    hands  . Cancer ([place])    skin  . COPD (chronic obstructive pulmonary disease) ([place])    PFTS 2013 FEV1 nearly normalized  . Depression   . Diabetes mellitus    diet controlled  . GERD (gastroesophageal reflux disease)   . Headache    sinus  . Hyperlipidemia   . Hypertension   . Hypothyroid 2002  . Lichen sclerosus et atrophicus of the vulva 01/2011  . Neuropathy    diabetic  . Raynauds disease   . Rheumatic fever    HX    Past Surgical History:  Procedure Laterality Date  . ABDOMINAL HYSTERECTOMY  1978  . BACK SURGERY  2000   repair cord damage after 1st back surgery  . COLONOSCOPY  2004  . ETHMOIDECTOMY Bilateral 01/24/2016   Procedure: TOTAL ETHMOIDECTOMY BILATERAL;  Surgeon: Clyde Canterbury, MD;  Location: [place];  Service: ENT;  Laterality: Bilateral;  . FRONTAL SINUS EXPLORATION Bilateral 01/24/2016   Procedure: BILATERAL FRONTAL SINUSOTOMY;  Surgeon: Clyde Canterbury, MD;  Location: [place];  Service: ENT;  Laterality: Bilateral;  Latex  sensitivity  . IMAGE GUIDED SINUS SURGERY N/A 01/24/2016   Procedure: IMAGE GUIDED SINUS SURGERY;  Surgeon: Clyde Canterbury, MD;  Location: [place];  Service: ENT;  Laterality: N/A;  GAVE DISK TO [facility] 3/27  . MAXILLARY ANTROSTOMY Bilateral 01/24/2016   Procedure: MAXILLARY ANTROSTOMY BILATERAL ENDOSCOPICWITH TISSUE REMOVAL;  Surgeon: Clyde Canterbury, MD;  Location: [place];  Service: ENT;  Laterality: Bilateral;  . SEPTOPLASTY N/A 01/24/2016   Procedure: SEPTOPLASTY NASAL;  Surgeon: Clyde Canterbury, MD;  Location: [place];  Service: ENT;  Laterality: N/A;  . SPINE SURGERY     L3 to L5  . TONSILLECTOMY      MEDICATIONS: I have reviewed all medications and confirmed regimen as documented  Social History   Socioeconomic History  . Marital status: Widowed    Spouse name: Not on file  . Number of children: Not on file  . Years of education: Not on file  . Highest education level: Not on file  Occupational History  . 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 Financial resource strain: Not on file  . Food insecurity:    Worry: Not on file    Inability: Not on file  . Transportation needs:    Medical: Not on file    Non-medical: Not on file  Tobacco Use  . Smoking status: Former Smoker    Packs/day: 0.50    Years: 25.00    Pack years: 12.50  Types: Cigarettes    Last attempt to quit: 09/17/1993    Years since quitting: 24.6  . Smokeless tobacco: Never Used  Substance and Sexual Activity  . Alcohol use: No  . Drug use: No  . Sexual activity: Not Currently  Lifestyle  . Physical activity:    Days per week: Not on file    Minutes per session: Not on file  . Stress: Not on file  Relationships  . Social connections:    Talks on phone: Not on file    Gets together: Not on file    Attends religious service: Not on file    Active member of club or organization: Not on file    Attends meetings of clubs or organizations:  Not on file    Relationship status: Not on file  . Intimate partner violence:    Fear of current or ex partner: Not on file    Emotionally abused: Not on file    Physically abused: Not on file    Forced sexual activity: Not on file  Other Topics Concern  . Not on file  Social History Narrative  . Not on file    Family History  Problem Relation Age of Onset  . Heart disease Mother        CHF and cardiomyopathy  . Thyroid disease Mother   . Hyperlipidemia Mother   . Hyperlipidemia Father   . Cancer Father        liver  . Lupus Sister   . Hyperlipidemia Sister   . Breast cancer Neg Hx     ROS: No fever, myalgias/arthralgias, unexplained weight loss or weight gain No new focal weakness or sensory deficits No otalgia, hearing loss, visual changes, nasal and sinus symptoms, mouth and throat problems No neck pain or adenopathy No abdominal pain, N/V/D, diarrhea, change in bowel pattern No dysuria, change in urinary pattern   Vitals:   05/08/18 0929 05/08/18 0935  BP:  110/80  Pulse:  65  SpO2:  95%  Weight: 151 lb (68.5 kg)   Height: '5\' 5"'  (1.651 m)      EXAM:  Gen: WDWN, No overt respiratory distress HEENT: NCAT, sclera white, oropharynx normal Neck: Supple without LAN, thyromegaly, JVD Lungs: breath sounds full, percussion normal, adventitious sounds: none Cardiovascular: RRR, no murmurs noted Abdomen: Soft, nontender, normal BS Ext: without clubbing, cyanosis, edema Neuro: CNs grossly intact, motor and sensory intact Skin: Limited exam, no lesions noted  DATA:   BMP Latest Ref Rng & Units 04/29/2018 02/18/2017 10/24/2015  Glucose 70 - 99 mg/dL 164(H) 108(H) 87  BUN 6 - 23 mg/dL 24(H) 26(H) 18  Creatinine 0.40 - 1.20 mg/dL 0.84 0.81 0.62  Sodium 135 - 145 mEq/L 138 136 137  Potassium 3.5 - 5.1 mEq/L 3.8 3.9 4.4  Chloride 96 - 112 mEq/L 99 100 98  CO2 19 - 32 mEq/L '30 27 29  ' Calcium 8.4 - 10.5 mg/dL 10.4 10.3 10.2    CBC Latest Ref Rng & Units 04/28/2018  02/18/2017 05/09/2015  WBC 4.0 - 10.5 K/uL 11.4(H) 10.3 10.2  Hemoglobin 12.0 - 15.0 g/dL 14.9 15.9(H) 15.5  Hematocrit 36.0 - 46.0 % 44.0 46.8(H) 45.3  Platelets 150.0 - 400.0 K/uL 267.0 299.0 266    CXR 08/12: normal    I have personally reviewed all chest radiographs reported above including CXRs and CT chest unless otherwise indicated  IMPRESSION:     ICD-10-CM   1. Former smoker Z87.891 Pulmonary Function Test [facility] Only  2. History of asthma Z87.09 Pulmonary Function Test [facility] Only    Hypersensitivity pnuemonitis profile    Sedimentation rate    CANCELED: Sedimentation rate  3. Episodic dyspnea with associated fatigue and malaise R06.09 Hypersensitivity pnuemonitis profile    Brain natriuretic peptide    Sedimentation rate    CANCELED: Sedimentation rate    CANCELED: Brain natriuretic peptide  4. Orthopnea R06.01 Brain natriuretic peptide    CANCELED: Brain natriuretic peptide  5. Paroxysmal nocturnal dyspnea R06.00 Brain natriuretic peptide    CANCELED: Brain natriuretic peptide  6. History of childhood asthma   PLAN:  Diagnostic blood tests: BNP, ESR, HSP panel Trial of Breo inhaler Continue albuterol as needed ROV 4-6 wks with PFTs prior   Merton Border, MD PCCM service Mobile [phone] Pager [phone] 05/14/2018 12:56 PM

## 2018-05-20 ENCOUNTER — Other Ambulatory Visit: Payer: Self-pay

## 2018-05-20 MED ORDER — LEVOTHYROXINE SODIUM 100 MCG PO TABS: 100.0000 ug | ORAL_TABLET | Freq: Every day | ORAL | 0 refills | Status: DC

## 2018-05-26 ENCOUNTER — Other Ambulatory Visit: Payer: Self-pay | Admitting: Internal Medicine

## 2018-05-26 ENCOUNTER — Ambulatory Visit: Payer: Medicare HMO | Admitting: Internal Medicine

## 2018-06-10 ENCOUNTER — Ambulatory Visit: Payer: Medicare HMO | Attending: Pulmonary Disease

## 2018-06-10 DIAGNOSIS — Z8709 Personal history of other diseases of the respiratory system: Secondary | ICD-10-CM

## 2018-06-10 DIAGNOSIS — Z87890 Personal history of sex reassignment: Secondary | ICD-10-CM | POA: Insufficient documentation

## 2018-06-10 DIAGNOSIS — J45909 Unspecified asthma, uncomplicated: Secondary | ICD-10-CM | POA: Insufficient documentation

## 2018-06-10 DIAGNOSIS — Z87891 Personal history of nicotine dependence: Secondary | ICD-10-CM | POA: Diagnosis not present

## 2018-06-18 ENCOUNTER — Ambulatory Visit: Payer: Medicare HMO | Admitting: Pulmonary Disease

## 2018-06-18 ENCOUNTER — Encounter: Payer: Self-pay | Admitting: Pulmonary Disease

## 2018-06-18 VITALS — BP 110/64 | HR 74 | Resp 16 | Ht 67.0 in | Wt 151.0 lb

## 2018-06-18 DIAGNOSIS — Z23 Encounter for immunization: Secondary | ICD-10-CM | POA: Diagnosis not present

## 2018-06-18 DIAGNOSIS — J4530 Mild persistent asthma, uncomplicated: Secondary | ICD-10-CM | POA: Diagnosis not present

## 2018-06-18 DIAGNOSIS — Z87891 Personal history of nicotine dependence: Secondary | ICD-10-CM

## 2018-06-18 MED ORDER — FLUTICASONE FUROATE-VILANTEROL 100-25 MCG/INH IN AEPB: 1.0000 | INHALATION_SPRAY | Freq: Every day | RESPIRATORY_TRACT | 5 refills | Status: DC

## 2018-06-18 NOTE — Patient Instructions (Signed)
Continue Breo inhaler.  Prescription entered  Continue albuterol as needed for increased shortness of breath, cough, wheezing, chest tightness  Discuss with Dr. Derrel Nip your symptom of rapid heart rate with exertion  Follow-up in 6 months.  Call sooner if needed

## 2018-06-18 NOTE — Progress Notes (Signed)
PULMONARY CONSULT NOTE  Requesting MD/Service: Derrel Nip Date of initial consultation: 05/08/18 Reason for consultation: Episodic respiratory and "flu-like" symptoms  PT PROFILE: 72 y.o. female former smoker (1 PPD x 29 yrs, quit 1994) with hx of asthma as a child and "inflammatory arthritis" referred for 6 mos of recurrent "flu-like" illness including cough and dyspnea  DATA: 2013 PFTs: normal 06/10/18 PFTs: FVC: 2.82 L (96 %pred), FEV1: 2.03 L (90 %pred), FEV1/FVC: 72%, TLC: 4.86 L (93 %pred), DLCO 88 %pred.  Flow volume curve consistent with possible mild obstruction    INTERVAL: No major events  SUBJ:  She returns today for scheduled evaluation.  Last visit, BNP was normal, sed rate was normal, hypersensitivity panel was entirely normal.  She was given a sample of Beio inhaler which she believes was beneficial.  She has used the inhaler and is no longer on any controller medication.  She uses albuterol inhaler approximately once a day with improvement in shortness of breath and chest tightness.  She also describes exertional tachycardia without chest pain.  She denies CP, fever, purulent sputum, hemoptysis, LE edema and calf tenderness    OBJ: Vitals:   06/18/18 0814 06/18/18 0817  BP:  110/64  Pulse:  74  Resp: 16   SpO2:  95%  Weight: 151 lb (68.5 kg)   Height: 5\' 7"  (1.702 m)   RA   EXAM:  Gen: NAD HEENT: NCAT, sclera white Neck: No JVD Lungs: breath sounds full, no wheezes or other adventitious sounds Cardiovascular: RRR, no murmurs Abdomen: Soft, nontender, normal BS Ext: without clubbing, cyanosis, edema Neuro: grossly intact Skin: Limited exam, no lesions noted   DATA:   BMP Latest Ref Rng & Units 04/29/2018 02/18/2017 10/24/2015  Glucose 70 - 99 mg/dL 164(H) 108(H) 87  BUN 6 - 23 mg/dL 24(H) 26(H) 18  Creatinine 0.40 - 1.20 mg/dL 0.84 0.81 0.62  Sodium 135 - 145 mEq/L 138 136 137  Potassium 3.5 - 5.1 mEq/L 3.8 3.9 4.4  Chloride 96 - 112 mEq/L 99 100 98  CO2 19  - 32 mEq/L 30 27 29   Calcium 8.4 - 10.5 mg/dL 10.4 10.3 10.2    CBC Latest Ref Rng & Units 04/28/2018 02/18/2017 05/09/2015  WBC 4.0 - 10.5 K/uL 11.4(H) 10.3 10.2  Hemoglobin 12.0 - 15.0 g/dL 14.9 15.9(H) 15.5  Hematocrit 36.0 - 46.0 % 44.0 46.8(H) 45.3  Platelets 150.0 - 400.0 K/uL 267.0 299.0 266    CXR: NNF  I have personally reviewed all chest radiographs reported above including CXRs and CT chest unless otherwise indicated  IMPRESSION:     ICD-10-CM   1. Former smoker Z87.891   2. Mild persistent asthma without complication H85.27   3. Encounter for immunization Z23 Flu vaccine HIGH DOSE PF     PLAN:  Continue Breo inhaler.  Prescription entered  Continue albuterol as needed for increased shortness of breath, cough, wheezing, chest tightness  Discuss with Dr. Derrel Nip your symptom of rapid heart rate with exertion  Follow-up in 6 months.  Call sooner if needed   Merton Border, MD PCCM service Mobile [phone] Pager [phone] 06/18/2018 8:48 AM

## 2018-06-23 ENCOUNTER — Encounter: Payer: Self-pay | Admitting: Internal Medicine

## 2018-06-23 ENCOUNTER — Ambulatory Visit (INDEPENDENT_AMBULATORY_CARE_PROVIDER_SITE_OTHER): Payer: Medicare HMO | Admitting: Internal Medicine

## 2018-06-23 VITALS — BP 122/58 | HR 74 | Temp 98.2°F | Resp 15 | Ht 67.0 in | Wt 153.4 lb

## 2018-06-23 DIAGNOSIS — E1140 Type 2 diabetes mellitus with diabetic neuropathy, unspecified: Secondary | ICD-10-CM

## 2018-06-23 DIAGNOSIS — R000 Tachycardia, unspecified: Secondary | ICD-10-CM | POA: Diagnosis not present

## 2018-06-23 DIAGNOSIS — J439 Emphysema, unspecified: Secondary | ICD-10-CM | POA: Diagnosis not present

## 2018-06-23 DIAGNOSIS — E034 Atrophy of thyroid (acquired): Secondary | ICD-10-CM | POA: Diagnosis not present

## 2018-06-23 DIAGNOSIS — I10 Essential (primary) hypertension: Secondary | ICD-10-CM

## 2018-06-23 DIAGNOSIS — D751 Secondary polycythemia: Secondary | ICD-10-CM

## 2018-06-23 DIAGNOSIS — F4321 Adjustment disorder with depressed mood: Secondary | ICD-10-CM

## 2018-06-23 MED ORDER — ZOSTER VAC RECOMB ADJUVANTED 50 MCG/0.5ML IM SUSR: 0.5000 mL | Freq: Once | INTRAMUSCULAR | 1 refills | Status: AC

## 2018-06-23 MED ORDER — CELECOXIB 200 MG PO CAPS: 200.0000 mg | ORAL_CAPSULE | Freq: Every day | ORAL | 5 refills | Status: DC

## 2018-06-23 NOTE — Progress Notes (Signed)
Subjective:  Patient ID: [name] Day, female    DOB: 01-10-1946  Age: 72 y.o. MRN: [id number]  CC: The primary encounter diagnosis was Type 2 diabetes mellitus with diabetic neuropathy, without long-term current use of insulin ([place]). Diagnoses of Hypothyroidism due to acquired atrophy of thyroid, Polycythemia, secondary, Essential hypertension, Pulmonary emphysema, unspecified emphysema type ([place]), Tachycardia, and Grief were also pertinent to this visit.  HPI ELFREDA BLANCHET presents for follow up on  Multiple issues raised at last visit 2 moths ago .  GAD : symptoms greatly improved with resolution of the conflict with a former neighbor.  Grief:  Continues to have a difficult time after the loss of her beloved dog Abby.  Dyspnea:  Was referred to pulmonology fore recurrent flu like respiratory infections.  Mild COPD suggested by PFTs.  Symptoms improved with use of Breo daily and prn albuterol   Rapid heart rate : she hs episodes of tachycardia occurring in the evenig at rest.   Has been drinking a lot of green tea, and presumed that all green tea was decaffeinated   Recent fall:  Fell back ward over [employer].  Fell onto [employer].  Felt she had whiplash for 1-2 days but symptoms resolved.   Lab Results  Component Value Date   HGBA1C 6.0 04/28/2018   Lab Results  Component Value Date   TSH 3.09 04/28/2018     Outpatient Medications Prior to Visit  Medication Sig Dispense Refill  . albuterol (PROAIR HFA) 108 (90 Base) MCG/ACT inhaler Inhale 2 puffs into the lungs every 4 (four) hours as needed for wheezing. 1 Inhaler 2  . cholecalciferol (VITAMIN D) 400 UNITS TABS tablet Take 400 Units by mouth.    . fexofenadine (ALLEGRA) 180 MG tablet Take 180 mg by mouth daily.    . fluticasone furoate-vilanterol (BREO ELLIPTA) 100-25 MCG/INH AEPB Inhale 1 puff into the lungs daily. 60 each 5  . gabapentin (NEURONTIN) 300 MG capsule TAKE ONE CAPSULE BY MOUTH WITH BREAKFAST  AND LUNCH, AND 2 CAPSULES EVERY NIGHT AT BEDTIME 120 capsule 1  . lactobacillus acidophilus (BACID) TABS tablet Take 1 tablet by mouth daily.    Marland Kitchen levothyroxine (SYNTHROID, LEVOTHROID) 100 MCG tablet Take 1 tablet (100 mcg total) by mouth daily. 90 tablet 0  . lisinopril-hydrochlorothiazide (PRINZIDE,ZESTORETIC) 20-25 MG tablet TAKE 1 TABLET BY MOUTH DAILY 30 tablet 0  . Multiple Vitamins-Minerals (SENIOR MULTIVITAMIN PLUS) TABS Take 1 tablet by mouth.      . sertraline (ZOLOFT) 100 MG tablet TAKE 1 TABLET(100 MG) BY MOUTH DAILY 90 tablet 3   No facility-administered medications prior to visit.     Review of Systems;  Patient denies headache, fevers, malaise, unintentional weight loss, skin rash, eye pain, sinus congestion and sinus pain, sore throat, dysphagia,  hemoptysis , cough, dyspnea, wheezing, chest pain, palpitations, orthopnea, edema, abdominal pain, nausea, melena, diarrhea, constipation, flank pain, dysuria, hematuria, urinary  Frequency, nocturia, numbness, tingling, seizures,  Focal weakness, Loss of consciousness,  Tremor, insomnia, depression, anxiety, and suicidal ideation.      Objective:  BP (!) 122/58 (BP Location: Left Arm, Patient Position: Sitting, Cuff Size: Normal)   Pulse 74   Temp 98.2 F (36.8 C) (Oral)   Resp 15   Ht 5\' 7"  (1.702 m)   Wt 153 lb 6.4 oz (69.6 kg)   SpO2 94%   BMI 24.03 kg/m   BP Readings from Last 3 Encounters:  06/23/18 (!) 122/58  06/18/18 110/64  05/08/18  110/80    Wt Readings from Last 3 Encounters:  06/23/18 153 lb 6.4 oz (69.6 kg)  06/18/18 151 lb (68.5 kg)  05/08/18 151 lb (68.5 kg)    General appearance: alert, cooperative and appears stated age Ears: normal TM's and external ear canals both ears Throat: lips, mucosa, and tongue normal; teeth and gums normal Neck: no adenopathy, no carotid bruit, supple, symmetrical, trachea midline and thyroid not enlarged, symmetric, no tenderness/mass/nodules Back: symmetric, no  curvature. ROM normal. No CVA tenderness. Lungs: clear to auscultation bilaterally Heart: regular rate and rhythm, S1, S2 normal, no murmur, click, rub or gallop Abdomen: soft, non-tender; bowel sounds normal; no masses,  no organomegaly Pulses: 2+ and symmetric Skin: Skin color, texture, turgor normal. No rashes or lesions Lymph nodes: Cervical, supraclavicular, and axillary nodes normal.  Lab Results  Component Value Date   HGBA1C 6.0 04/28/2018   HGBA1C 6.0 02/18/2017   HGBA1C 5.9 10/24/2015    Lab Results  Component Value Date   CREATININE 0.84 04/29/2018   CREATININE 0.81 02/18/2017   CREATININE 0.62 10/24/2015    Lab Results  Component Value Date   WBC 11.4 (H) 04/28/2018   HGB 14.9 04/28/2018   HCT 44.0 04/28/2018   PLT 267.0 04/28/2018   GLUCOSE 164 (H) 04/29/2018   CHOL 281 (H) 04/28/2018   TRIG 304.0 (H) 04/28/2018   HDL 42.50 04/28/2018   LDLDIRECT 202.0 04/28/2018   LDLCALC 81 03/08/2014   ALT 26 04/29/2018   AST 21 04/29/2018   NA 138 04/29/2018   K 3.8 04/29/2018   CL 99 04/29/2018   CREATININE 0.84 04/29/2018   BUN 24 (H) 04/29/2018   CO2 30 04/29/2018   TSH 3.09 04/28/2018   HGBA1C 6.0 04/28/2018   MICROALBUR 0.7 04/28/2018    No results found.  Assessment & Plan:   Problem List Items Addressed This Visit    COPD (chronic obstructive pulmonary disease) ([place])    Secondary to tobacco abuse in settinn of childhood related asthma. Mild by PFTs  . Symptoms have resolved with  Regular  use of Breo.  She has had the appropriate vaccinations       Diabetes mellitus with neuropathy ([place]) - Primary   Relevant Orders   Hemoglobin A1c   Lipid panel   Grief    Patient is dealing with the  loss of her dog of 13 years and has adequate coping skills and emotional support .       Hypertension   Relevant Orders   Comprehensive metabolic panel   Hypothyroidism   Relevant Orders   TSH   Polycythemia, secondary   Relevant Orders   CBC with  Differential/Platelet   Tachycardia    Episodic ,  Not present today,.  May be related to caffeine (unintentional) .  Advised to use another tea.          I have changed Maidie P. Fonner's CELEBREX to celecoxib. I am also having her start on Zoster Vaccine Adjuvanted. Additionally, I am having her maintain her SENIOR MULTIVITAMIN PLUS, fexofenadine, cholecalciferol, lactobacillus acidophilus, sertraline, albuterol, gabapentin, levothyroxine, lisinopril-hydrochlorothiazide, and fluticasone furoate-vilanterol.  Meds ordered this encounter  Medications  . celecoxib (CELEBREX) 200 MG capsule    Sig: Take 1 capsule (200 mg total) by mouth daily.    Dispense:  30 capsule    Refill:  5  . Zoster Vaccine Adjuvanted [facility]) injection    Sig: Inject 0.5 mLs into the muscle once for 1 dose.    Dispense:  1 each    Refill:  1    There are no discontinued medications.  Follow-up: Return in about 4 months (around 10/24/2018) for follow up diabetes.   Crecencio Mc, MD

## 2018-06-23 NOTE — Patient Instructions (Addendum)
Resume celebrex 200 mg once daily.  Continue tylenol  Check BP and if  140/90o or higher persistently,  Let me know   Green tea  May be  the cause of your palpitations due to the  presence of caffeine    The ShingRx vaccine is now available in local pharmacies and is much more protective thant Zostavaxs,  It is therefore ADVISED for all interested adults over 50 to prevent shingles   I will see you again in february.  Fasting labs have been ordered

## 2018-06-24 DIAGNOSIS — F4321 Adjustment disorder with depressed mood: Secondary | ICD-10-CM | POA: Insufficient documentation

## 2018-06-24 DIAGNOSIS — R000 Tachycardia, unspecified: Secondary | ICD-10-CM | POA: Insufficient documentation

## 2018-06-24 NOTE — Assessment & Plan Note (Signed)
Episodic ,  Not present today,.  May be related to caffeine (unintentional) .  Advised to use another tea.

## 2018-06-24 NOTE — Assessment & Plan Note (Addendum)
Secondary to tobacco abuse in settinn of childhood related asthma. Mild by PFTs  . Symptoms have resolved with  Regular  use of Breo.  She has had the appropriate vaccinations

## 2018-06-24 NOTE — Assessment & Plan Note (Signed)
Patient is dealing with the  loss of her dog of 13 years and has adequate coping skills and emotional support .

## 2018-06-30 ENCOUNTER — Other Ambulatory Visit: Payer: Self-pay | Admitting: Internal Medicine

## 2018-08-05 ENCOUNTER — Other Ambulatory Visit: Payer: Self-pay | Admitting: Internal Medicine

## 2018-08-18 ENCOUNTER — Other Ambulatory Visit: Payer: Self-pay

## 2018-08-18 ENCOUNTER — Other Ambulatory Visit: Payer: Self-pay | Admitting: Internal Medicine

## 2018-08-18 MED ORDER — LEVOTHYROXINE SODIUM 100 MCG PO TABS: 100.0000 ug | ORAL_TABLET | Freq: Every day | ORAL | 0 refills | Status: DC

## 2018-11-13 ENCOUNTER — Other Ambulatory Visit: Payer: Self-pay | Admitting: Internal Medicine

## 2018-11-17 ENCOUNTER — Ambulatory Visit (INDEPENDENT_AMBULATORY_CARE_PROVIDER_SITE_OTHER): Payer: Medicare HMO

## 2018-11-17 ENCOUNTER — Encounter: Payer: Self-pay | Admitting: Internal Medicine

## 2018-11-17 ENCOUNTER — Ambulatory Visit (INDEPENDENT_AMBULATORY_CARE_PROVIDER_SITE_OTHER): Payer: Medicare HMO | Admitting: Internal Medicine

## 2018-11-17 VITALS — BP 122/70 | HR 70 | Temp 98.0°F | Resp 15 | Ht 64.75 in | Wt 156.8 lb

## 2018-11-17 VITALS — BP 122/70 | HR 70 | Temp 98.0°F | Resp 15 | Ht 64.75 in | Wt 156.0 lb

## 2018-11-17 DIAGNOSIS — Z0000 Encounter for general adult medical examination without abnormal findings: Secondary | ICD-10-CM | POA: Diagnosis not present

## 2018-11-17 DIAGNOSIS — E1140 Type 2 diabetes mellitus with diabetic neuropathy, unspecified: Secondary | ICD-10-CM | POA: Diagnosis not present

## 2018-11-17 DIAGNOSIS — Z1211 Encounter for screening for malignant neoplasm of colon: Secondary | ICD-10-CM

## 2018-11-17 DIAGNOSIS — Z1239 Encounter for other screening for malignant neoplasm of breast: Secondary | ICD-10-CM | POA: Diagnosis not present

## 2018-11-17 DIAGNOSIS — E782 Mixed hyperlipidemia: Secondary | ICD-10-CM

## 2018-11-17 DIAGNOSIS — G2581 Restless legs syndrome: Secondary | ICD-10-CM | POA: Diagnosis not present

## 2018-11-17 DIAGNOSIS — F4321 Adjustment disorder with depressed mood: Secondary | ICD-10-CM | POA: Diagnosis not present

## 2018-11-17 DIAGNOSIS — I10 Essential (primary) hypertension: Secondary | ICD-10-CM

## 2018-11-17 DIAGNOSIS — E039 Hypothyroidism, unspecified: Secondary | ICD-10-CM | POA: Diagnosis not present

## 2018-11-17 DIAGNOSIS — Z87891 Personal history of nicotine dependence: Secondary | ICD-10-CM

## 2018-11-17 MED ORDER — CELECOXIB 200 MG PO CAPS: 200.0000 mg | ORAL_CAPSULE | Freq: Every day | ORAL | 1 refills | Status: DC

## 2018-11-17 MED ORDER — SERTRALINE HCL 100 MG PO TABS: ORAL_TABLET | ORAL | 1 refills | Status: DC

## 2018-11-17 MED ORDER — HYDROCHLOROTHIAZIDE 25 MG PO TABS: 25.0000 mg | ORAL_TABLET | Freq: Every day | ORAL | 3 refills | Status: DC

## 2018-11-17 MED ORDER — TELMISARTAN 40 MG PO TABS: 40.0000 mg | ORAL_TABLET | Freq: Every day | ORAL | 0 refills | Status: DC

## 2018-11-17 MED ORDER — GABAPENTIN 300 MG PO CAPS: 300.0000 mg | ORAL_CAPSULE | Freq: Three times a day (TID) | ORAL | 3 refills | Status: DC

## 2018-11-17 NOTE — Patient Instructions (Addendum)
I have refilled most of your medications for 90 days.  Fill them as soon as your insurance will allow.   I am making a decision to change your lisinopril to telmisartan, based on increased reports by one of my ENT colleagues of patients  developing tongue and throat swelling from lisinopril.  The condition , called "angioedema," can be fatal if a person's airway is compromised.  I also want you to take it at night instead of morning,  as recent studies have shown that taking your blood pressure medications at night protects you better from heart attacks and strokes. Continue to take the hctz in the morning   Your annual mammogram has been ordered.  You are encouraged (required) to call to make your appointment at [facility]   I will initiate the order for your home test for colon cancer .    It is called  Cologuard.  It will be delivered to your house, and you will send off a stool sample in the envelope it provides.

## 2018-11-17 NOTE — Patient Instructions (Addendum)
  [name] Day , Thank you for taking time to come for your Medicare Wellness Visit. I appreciate your ongoing commitment to your health goals. Please review the following plan we discussed and let me know if I can assist you in the future.   These are the goals we discussed: Goals      Patient Stated   . DIET - INCREASE WATER INTAKE (pt-stated)     Stay hydrated       This is a list of the screening recommended for you and due dates:  Health Maintenance  Topic Date Due  . Complete foot exam   03/10/2015  . Colon Cancer Screening  04/17/2017  . Eye exam for diabetics  08/01/2017  . Hemoglobin A1C  10/29/2018  . Mammogram  10/31/2019  . Tetanus Vaccine  10/06/2024  . Flu Shot  Completed  . DEXA scan (bone density measurement)  Completed  .  Hepatitis C: One time screening is recommended by Center for Disease Control  (CDC) for  adults born from [birth year] through 1964.   Completed  . Pneumonia vaccines  Completed

## 2018-11-17 NOTE — Progress Notes (Signed)
Subjective:  Patient ID: [name] Day, female    DOB: [birth year]  Age: 73 y.o. MRN: [id number]  CC: The primary encounter diagnosis was Acquired hypothyroidism. Diagnoses of Breast cancer screening, Restless legs, Mixed hyperlipidemia, Former smoker, Type 2 diabetes mellitus with diabetic neuropathy, without long-term current use of insulin ([place]), Colon cancer screening, Grief, and Essential hypertension were also pertinent to this visit.  HPI [name] Day presents for 6 month follow up on diabetes, type 2 with neuropathy, hypertension and hyperlipidemia.  Patient has no complaints today.  Patient is following a low glycemic index diet and taking all prescribed medications regularly without side effects.  Fasting sugars have been under less than 140 most of the time and post prandials have been under 160 except on rare occasions. Patient is exercising about 3 times per week and intentionally trying to lose weight .  Patient has had an eye exam in the last 12 months and checks feet regularly for signs of infection.  Patient does not walk barefoot outside,  And denies an numbness tingling or burning in feet. Patient is up to date on all recommended vaccinations  Foot exam done today  callouses and decreased sensation bilaterally left greater than right   Legs bothering her at night  Right knee pain since the fall,  Last year.  Mild uses a race    Mild COPD: by Sept PFTs started on steroid/LABA MDI by Alva Garnet and ginve flu vaccine in October  Had a flu like illness In january   Lab Results  Component Value Date   TSH 7.31 (H) 11/17/2018      Outpatient Medications Prior to Visit  Medication Sig Dispense Refill  . albuterol (PROAIR HFA) 108 (90 Base) MCG/ACT inhaler Inhale 2 puffs into the lungs every 4 (four) hours as needed for wheezing. 1 Inhaler 2  . cholecalciferol (VITAMIN D) 400 UNITS TABS tablet Take 400 Units by mouth.    . fexofenadine (ALLEGRA) 180 MG tablet Take 180  mg by mouth daily.    . fluticasone furoate-vilanterol (BREO ELLIPTA) 100-25 MCG/INH AEPB Inhale 1 puff into the lungs daily. 60 each 5  . lactobacillus acidophilus (BACID) TABS tablet Take 1 tablet by mouth daily.    . Multiple Vitamins-Minerals (SENIOR MULTIVITAMIN PLUS) TABS Take 1 tablet by mouth.      . celecoxib (CELEBREX) 200 MG capsule Take 1 capsule (200 mg total) by mouth daily. 30 capsule 5  . gabapentin (NEURONTIN) 300 MG capsule TAKE ONE CAPSULE BY MOUTH WITH BREAKFAST AND LUNCH, AND 2 CAPSULES EVERY NIGHT AT BEDTIME 120 capsule 5  . levothyroxine (SYNTHROID, LEVOTHROID) 100 MCG tablet TAKE 1 TABLET BY MOUTH DAILY( LABS NEEDED PRIOR TO NEXT REFILL) 90 tablet 0  . lisinopril-hydrochlorothiazide (PRINZIDE,ZESTORETIC) 20-25 MG tablet TAKE 1 TABLET BY MOUTH DAILY 90 tablet 1  . sertraline (ZOLOFT) 100 MG tablet TAKE 1 TABLET(100 MG) BY MOUTH DAILY 90 tablet 1   No facility-administered medications prior to visit.     Review of Systems;  Patient denies headache, fevers, malaise, unintentional weight loss, skin rash, eye pain, sinus congestion and sinus pain, sore throat, dysphagia,  hemoptysis , cough, dyspnea, wheezing, chest pain, palpitations, orthopnea, edema, abdominal pain, nausea, melena, diarrhea, constipation, flank pain, dysuria, hematuria, urinary  Frequency, nocturia, numbness, tingling, seizures,  Focal weakness, Loss of consciousness,  Tremor, insomnia, depression, anxiety, and suicidal ideation.      Objective:  BP 122/70 (BP Location: Left Arm, Patient Position: Sitting, Cuff Size: Normal)  Pulse 70   Temp 98 F (36.7 C) (Oral)   Resp 15   Ht 5' 4.75" (1.645 m)   Wt 156 lb (70.8 kg)   BMI 26.16 kg/m   BP Readings from Last 3 Encounters:  11/17/18 122/70  11/17/18 122/70  06/23/18 (!) 122/58    Wt Readings from Last 3 Encounters:  11/17/18 156 lb (70.8 kg)  11/17/18 156 lb 12.8 oz (71.1 kg)  06/23/18 153 lb 6.4 oz (69.6 kg)    General appearance:  alert, cooperative and appears stated age Ears: normal TM's and external ear canals both ears Throat: lips, mucosa, and tongue normal; teeth and gums normal Neck: no adenopathy, no carotid bruit, supple, symmetrical, trachea midline and thyroid not enlarged, symmetric, no tenderness/mass/nodules Back: symmetric, no curvature. ROM normal. No CVA tenderness. Lungs: clear to auscultation bilaterally Heart: regular rate and rhythm, S1, S2 normal, no murmur, click, rub or gallop Abdomen: soft, non-tender; bowel sounds normal; no masses,  no organomegaly Pulses: 2+ and symmetric Skin: Skin color, texture, turgor normal. No rashes or lesions Lymph nodes: Cervical, supraclavicular, and axillary nodes normal.  Lab Results  Component Value Date   HGBA1C 6.0 11/17/2018   HGBA1C 6.0 04/28/2018   HGBA1C 6.0 02/18/2017    Lab Results  Component Value Date   CREATININE 0.73 11/17/2018   CREATININE 0.84 04/29/2018   CREATININE 0.81 02/18/2017    Lab Results  Component Value Date   WBC 11.4 (H) 04/28/2018   HGB 14.9 04/28/2018   HCT 44.0 04/28/2018   PLT 267.0 04/28/2018   GLUCOSE 94 11/17/2018   CHOL 297 (H) 11/17/2018   TRIG 252.0 (H) 11/17/2018   HDL 46.00 11/17/2018   LDLDIRECT 215.0 11/17/2018   LDLCALC 81 03/08/2014   ALT 27 11/17/2018   AST 25 11/17/2018   NA 136 11/17/2018   K 4.3 11/17/2018   CL 100 11/17/2018   CREATININE 0.73 11/17/2018   BUN 28 (H) 11/17/2018   CO2 24 11/17/2018   TSH 7.31 (H) 11/17/2018   HGBA1C 6.0 11/17/2018   MICROALBUR 0.7 04/28/2018    No results found.  Assessment & Plan:   Problem List Items Addressed This Visit    Mixed hyperlipidemia   Relevant Medications   hydrochlorothiazide (HYDRODIURIL) 25 MG tablet   telmisartan (MICARDIS) 40 MG tablet   Other Relevant Orders   Lipid panel (Completed)   Hypothyroidism - Primary    TSH is elevated, ,  Will increase her  levothyoxine dose,  Recheck in 6 montths   Lab Results  Component Value  Date   TSH 7.31 (H) 11/17/2018         Relevant Medications   levothyroxine (SYNTHROID, LEVOTHROID) 112 MCG tablet   Other Relevant Orders   TSH (Completed)   Hypertension    Well controlled on current regimenof lisinopril/hctz for age. Changing to telmisartan . Marland Kitchen Renal function is overdue ,  no changes today.  Lab Results  Component Value Date   CREATININE 0.73 11/17/2018   Lab Results  Component Value Date   NA 136 11/17/2018   K 4.3 11/17/2018   CL 100 11/17/2018   CO2 24 11/17/2018         Relevant Medications   hydrochlorothiazide (HYDRODIURIL) 25 MG tablet   telmisartan (MICARDIS) 40 MG tablet   Grief    Patient is recovering from the  loss of her dog of 13 years, Abby and has adequate coping skills and emotional support .  Former smoker   Diabetes mellitus with neuropathy ([place])    Remains  well-controlled on diet alone, despite being lost to follow up.   hemoglobin A1c which has been consistently at or  less than 7.0 . Patient is up-to-date on eye exams and foot exam was normal today   Patient  has no microalbuminuria. Patient has avoided  statin therapy for CAD risk reduction but has tolerated  ACE/ARB for renal protection and hypertension   Lab Results  Component Value Date   HGBA1C 6.0 11/17/2018   Lab Results  Component Value Date   MICROALBUR 0.7 04/28/2018         Relevant Medications   telmisartan (MICARDIS) 40 MG tablet   Other Relevant Orders   Hemoglobin A1c (Completed)   Comprehensive metabolic panel (Completed)   Ambulatory referral to Ophthalmology    Other Visit Diagnoses    Breast cancer screening       Relevant Orders   MM 3D SCREEN BREAST BILATERAL   Restless legs       Relevant Orders   Iron, TIBC and Ferritin Panel (Completed)   Colon cancer screening       Relevant Orders   Cologuard      I have discontinued [name] Day's lisinopril-hydrochlorothiazide. I have also changed her gabapentin and levothyroxine.  Additionally, I am having her start on hydrochlorothiazide and telmisartan. Lastly, I am having her maintain her SENIOR MULTIVITAMIN PLUS, fexofenadine, cholecalciferol, lactobacillus acidophilus, albuterol, fluticasone furoate-vilanterol, celecoxib, and sertraline.  Meds ordered this encounter  Medications  . celecoxib (CELEBREX) 200 MG capsule    Sig: Take 1 capsule (200 mg total) by mouth daily.    Dispense:  90 capsule    Refill:  1    Keep on file for future refill  . gabapentin (NEURONTIN) 300 MG capsule    Sig: Take 1 capsule (300 mg total) by mouth 3 (three) times daily.    Dispense:  360 capsule    Refill:  3  . hydrochlorothiazide (HYDRODIURIL) 25 MG tablet    Sig: Take 1 tablet (25 mg total) by mouth daily.    Dispense:  90 tablet    Refill:  3  . telmisartan (MICARDIS) 40 MG tablet    Sig: Take 1 tablet (40 mg total) by mouth at bedtime.    Dispense:  90 tablet    Refill:  0  . sertraline (ZOLOFT) 100 MG tablet    Sig: TAKE 1 TABLET(100 MG) BY MOUTH DAILY    Dispense:  90 tablet    Refill:  1  . levothyroxine (SYNTHROID, LEVOTHROID) 112 MCG tablet    Sig: Take 1 tablet (112 mcg total) by mouth daily before breakfast.    Dispense:  90 tablet    Refill:  1    Medications Discontinued During This Encounter  Medication Reason  . celecoxib (CELEBREX) 200 MG capsule Reorder  . gabapentin (NEURONTIN) 300 MG capsule   . lisinopril-hydrochlorothiazide (PRINZIDE,ZESTORETIC) 20-25 MG tablet   . sertraline (ZOLOFT) 100 MG tablet Reorder  . levothyroxine (SYNTHROID, LEVOTHROID) 100 MCG tablet     Follow-up: No follow-ups on file.   Crecencio Mc, MD

## 2018-11-17 NOTE — Progress Notes (Addendum)
Subjective:   CORNEISHA ALVI is a 73 y.o. female who presents for Medicare Annual (Subsequent) preventive examination.  Review of Systems:  No ROS.  Medicare Wellness Visit. Additional risk factors are reflected in the social history. Cardiac Risk Factors include: advanced age (>91men, >86 women);diabetes mellitus;hypertension     Objective:     Vitals: BP 122/70 (BP Location: Left Arm, Patient Position: Sitting, Cuff Size: Normal)   Pulse 70   Temp 98 F (36.7 C) (Oral)   Resp 15   Ht 5' 4.75" (1.645 m)   Wt 156 lb 12.8 oz (71.1 kg)   BMI 26.29 kg/m   Body mass index is 26.29 kg/m.  Advanced Directives 11/17/2018 04/24/2017 04/24/2016 01/24/2016 02/03/2015  Does Patient Have a Medical Advance Directive? Yes Yes Yes Yes Yes  Type of Paramedic of [place];Living will [place];Living will [place];Living will [place];Living will Living will  Does patient want to make changes to medical advance directive? No - Patient declined No - Patient declined - - No - Patient declined  Copy of [place] in Chart? No - copy requested No - copy requested No - copy requested Yes No - copy requested    Tobacco Social History   Tobacco Use  Smoking Status Former Smoker  . Packs/day: 0.50  . Years: 25.00  . Pack years: 12.50  . Types: Cigarettes  . Last attempt to quit: 09/17/1993  . Years since quitting: 25.1  Smokeless Tobacco Never Used     Counseling given: Not Answered   Clinical Intake:  Pre-visit preparation completed: Yes  Pain : No/denies pain     Diabetes: Yes  How often do you need to have someone help you when you read instructions, pamphlets, or other written materials from your doctor or pharmacy?: 1 - Never  Interpreter Needed?: No     Past Medical History:  Diagnosis Date  . allergic rhinitis   . Arthritis    hands  . Cancer ([place])    skin  . COPD  (chronic obstructive pulmonary disease) ([place])    PFTS 2013 FEV1 nearly normalized  . Depression   . Diabetes mellitus    diet controlled  . GERD (gastroesophageal reflux disease)   . Headache    sinus  . Hyperlipidemia   . Hypertension   . Hypothyroid 2002  . Lichen sclerosus et atrophicus of the vulva 01/2011  . Neuropathy    diabetic  . Raynauds disease   . Rheumatic fever    HX   Past Surgical History:  Procedure Laterality Date  . ABDOMINAL HYSTERECTOMY  1978  . BACK SURGERY  2000   repair cord damage after 1st back surgery  . COLONOSCOPY  2004  . ETHMOIDECTOMY Bilateral 01/24/2016   Procedure: TOTAL ETHMOIDECTOMY BILATERAL;  Surgeon: Clyde Canterbury, MD;  Location: [place];  Service: ENT;  Laterality: Bilateral;  . FRONTAL SINUS EXPLORATION Bilateral 01/24/2016   Procedure: BILATERAL FRONTAL SINUSOTOMY;  Surgeon: Clyde Canterbury, MD;  Location: [place];  Service: ENT;  Laterality: Bilateral;  Latex sensitivity  . IMAGE GUIDED SINUS SURGERY N/A 01/24/2016   Procedure: IMAGE GUIDED SINUS SURGERY;  Surgeon: Clyde Canterbury, MD;  Location: [place];  Service: ENT;  Laterality: N/A;  GAVE DISK TO [facility] 3/27  . MAXILLARY ANTROSTOMY Bilateral 01/24/2016   Procedure: MAXILLARY ANTROSTOMY BILATERAL ENDOSCOPICWITH TISSUE REMOVAL;  Surgeon: Clyde Canterbury, MD;  Location: [place];  Service:  ENT;  Laterality: Bilateral;  . SEPTOPLASTY N/A 01/24/2016   Procedure: SEPTOPLASTY NASAL;  Surgeon: Clyde Canterbury, MD;  Location: [place];  Service: ENT;  Laterality: N/A;  . SPINE SURGERY     L3 to L5  . TONSILLECTOMY     Family History  Problem Relation Age of Onset  . Heart disease Mother        CHF and cardiomyopathy  . Thyroid disease Mother   . Hyperlipidemia Mother   . Hyperlipidemia Father   . Cancer Father        liver  . Lupus Sister   . Hyperlipidemia Sister   . Breast cancer Neg Hx    Social History   Socioeconomic History  . Marital  status: Widowed    Spouse name: Not on file  . Number of children: Not on file  . Years of education: Not on file  . Highest education level: Not on file  Occupational History  . 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 Financial resource strain: Not hard at all  . Food insecurity:    Worry: Never true    Inability: Never true  . Transportation needs:    Medical: No    Non-medical: No  Tobacco Use  . Smoking status: Former Smoker    Packs/day: 0.50    Years: 25.00    Pack years: 12.50    Types: Cigarettes    Last attempt to quit: 09/17/1993    Years since quitting: 25.1  . Smokeless tobacco: Never Used  Substance and Sexual Activity  . Alcohol use: No  . Drug use: No  . Sexual activity: Not Currently  Lifestyle  . Physical activity:    Days per week: Not on file    Minutes per session: Not on file  . Stress: Not on file  Relationships  . Social connections:    Talks on phone: Not on file    Gets together: Not on file    Attends religious service: Not on file    Active member of club or organization: Not on file    Attends meetings of clubs or organizations: Not on file    Relationship status: Not on file  Other Topics Concern  . Not on file  Social History Narrative  . Not on file    Outpatient Encounter Medications as of 11/17/2018  Medication Sig  . albuterol (PROAIR HFA) 108 (90 Base) MCG/ACT inhaler Inhale 2 puffs into the lungs every 4 (four) hours as needed for wheezing.  . cholecalciferol (VITAMIN D) 400 UNITS TABS tablet Take 400 Units by mouth.  . fexofenadine (ALLEGRA) 180 MG tablet Take 180 mg by mouth daily.  . fluticasone furoate-vilanterol (BREO ELLIPTA) 100-25 MCG/INH AEPB Inhale 1 puff into the lungs daily.  Marland Kitchen lactobacillus acidophilus (BACID) TABS tablet Take 1 tablet by mouth daily.  Marland Kitchen levothyroxine (SYNTHROID, LEVOTHROID) 100 MCG tablet TAKE 1 TABLET BY MOUTH DAILY( LABS NEEDED PRIOR TO NEXT REFILL)  .  Multiple Vitamins-Minerals (SENIOR MULTIVITAMIN PLUS) TABS Take 1 tablet by mouth.    . [DISCONTINUED] celecoxib (CELEBREX) 200 MG capsule Take 1 capsule (200 mg total) by mouth daily.  . [DISCONTINUED] gabapentin (NEURONTIN) 300 MG capsule TAKE ONE CAPSULE BY MOUTH WITH BREAKFAST AND LUNCH, AND 2 CAPSULES EVERY NIGHT AT BEDTIME  . [DISCONTINUED] levothyroxine (SYNTHROID, LEVOTHROID) 100 MCG tablet TAKE 1 TABLET BY MOUTH DAILY  . [DISCONTINUED] lisinopril-hydrochlorothiazide (PRINZIDE,ZESTORETIC) 20-25 MG tablet TAKE 1 TABLET BY MOUTH DAILY  . [  DISCONTINUED] sertraline (ZOLOFT) 100 MG tablet TAKE 1 TABLET(100 MG) BY MOUTH DAILY   No facility-administered encounter medications on file as of 11/17/2018.     Activities of Daily Living In your present state of health, do you have any difficulty performing the following activities: 11/17/2018  Hearing? N  Vision? N  Difficulty concentrating or making decisions? N  Walking or climbing stairs? Y  Comment R knee pain, intermittent  Dressing or bathing? N  Doing errands, shopping? N  Preparing Food and eating ? N  Using the Toilet? N  In the past six months, have you accidently leaked urine? Y  Comment Managed with daily pad  Do you have problems with loss of bowel control? N  Managing your Medications? N  Managing your Finances? N  Housekeeping or managing your Housekeeping? N  Some recent data might be hidden    Patient Care Team: Crecencio Mc, MD as PCP - General (Internal Medicine)    Assessment:   This is a routine wellness examination for [place].  Health Screenings  Mammogram -05/10/16 Colonoscopy -04/18/07 Bone Density -03/13/11 Glaucoma -none Hearing -demonstrates normal hearing during conversation Hemoglobin A1C -04/28/18 (6.0) Cholesterol -04/28/18 (281) Dental- every 6 months Vision- 2018.  Encouraged to schedule eye exam. Cataracts not removed. No retinopathy reported. Wears glasses.   Social  Alcohol intake -no Smoking  history- former Smokers in home? none Illicit drug use? none Exercise - some walking Diet -regular, monitors Sexually Active -not currently  Safety  Patient feels safe at home.  Patient does have smoke detectors at home  Patient does wear sunscreen or protective clothing when in direct sunlight  Patient does wear seat belt when driving or riding with others.   Activities of Daily Living Patient can do their own household chores. Denies needing assistance with: driving, feeding themselves, getting from bed to chair, getting to the toilet, bathing/showering, dressing, managing money, climbing flight of stairs, or preparing meals.   Depression Screen Patient denies losing interest in daily life, feeling hopeless, or crying easily over simple problems.   Fall Screen Patient denies being afraid of falling. No falls in the last 6 months ago. Twisted R knee; followed by pcp.   Memory Screen Patient denies problems with memory, misplacing items, and is able to balance checkbook/bank accounts.  Patient is alert, normal appearance, oriented to person/place/and time. Correctly identified the president of the [place], recall of 2/3 objects, and performing simple calculations.  Patient displays appropriate judgement and can read correct time from watch face.   Immunizations The following Immunizations are up to date: Influenza, shingles, pneumonia, and tetanus.   Other Providers Patient Care Team: Crecencio Mc, MD as PCP - General (Internal Medicine)  Exercise Activities and Dietary recommendations Current Exercise Habits: Home exercise routine, Type of exercise: walking, Time (Minutes): 20, Frequency (Times/Week): 3, Weekly Exercise (Minutes/Week): 60, Intensity: Mild  Goals      Patient Stated   . DIET - INCREASE WATER INTAKE (pt-stated)     Stay hydrated       Fall Risk Fall Risk  11/17/2018 07/01/2017 04/24/2017 04/24/2016 10/24/2015  Falls in the past year? 1 No No No No  Number  falls in past yr: 0 - - - -  Injury with Fall? 1 - - - -  Comment Followed by pcp - - - -   Depression Screen PHQ 2/9 Scores 11/17/2018 07/01/2017 04/24/2017 04/24/2016  PHQ - 2 Score 0 0 0 0  PHQ- 9 Score - - - -  Cognitive Function MMSE - Mini Mental State Exam 04/24/2017 04/24/2016  Orientation to time 5 5  Orientation to Place 5 5  Registration 3 3  Attention/ Calculation 5 5  Recall 3 3  Language- name 2 objects 2 2  Language- repeat 1 1  Language- follow 3 step command 3 3  Language- read & follow direction 1 1  Write a sentence 1 1  Copy design 1 1  Total score 30 30     6CIT Screen 11/17/2018  What Year? 0 points  What month? 0 points  What time? 0 points  Count back from 20 0 points  Months in reverse 0 points  Repeat phrase 0 points  Total Score 0    Immunization History  Administered Date(s) Administered  . Influenza Split 07/11/2014  . Influenza Whole 05/29/2008, 05/14/2011, 07/11/2012  . Influenza, High Dose Seasonal PF 10/24/2015, 06/18/2018  . Influenza,inj,Quad PF,6+ Mos 06/16/2013  . Influenza-Unspecified 05/18/2014, 07/26/2017  . Pneumococcal Conjugate-13 11/05/2013  . Pneumococcal Polysaccharide-23 08/28/2010, 10/24/2015  . Td 09/24/2003, 10/06/2014   Screening Tests Health Maintenance  Topic Date Due  . FOOT EXAM  03/10/2015  . COLONOSCOPY  04/17/2017  . OPHTHALMOLOGY EXAM  08/01/2017  . HEMOGLOBIN A1C  10/29/2018  . MAMMOGRAM  10/31/2019  . TETANUS/TDAP  10/06/2024  . INFLUENZA VACCINE  Completed  . DEXA SCAN  Completed  . Hepatitis C Screening  Completed  . PNA vac Low Risk Adult  Completed       Plan:    End of life planning; Advance aging; Advanced directives discussed. Copy of current HCPOA/Living Will requested.    I have personally reviewed and noted the following in the patient's chart:   . Medical and social history . Use of alcohol, tobacco or illicit drugs  . Current medications and supplements . Functional ability and  status . Nutritional status . Physical activity . Advanced directives . List of other physicians . Hospitalizations, surgeries, and ER visits in previous 12 months . Vitals . Screenings to include cognitive, depression, and falls . Referrals and appointments  In addition, I have reviewed and discussed with patient certain preventive protocols, quality metrics, and best practice recommendations. A written personalized care plan for preventive services as well as general preventive health recommendations were provided to patient.     OBrien-Blaney, Emile Ringgenberg L, LPN  11/21/1060   I have reviewed the above information and agree with above.   Deborra Medina, MD

## 2018-11-18 LAB — IRON,TIBC AND FERRITIN PANEL
%SAT: 14 % (calc) — ABNORMAL LOW (ref 16–45)
Ferritin: 90 ng/mL (ref 16–288)
Iron: 49 ug/dL (ref 45–160)
TIBC: 340 mcg/dL (calc) (ref 250–450)

## 2018-11-18 LAB — LIPID PANEL
CHOLESTEROL: 297 mg/dL — AB (ref 0–200)
HDL: 46 mg/dL (ref >39.00)
NonHDL: 251.48
Total CHOL/HDL Ratio: 6
Triglycerides: 252 mg/dL — ABNORMAL HIGH (ref 0.0–149.0)
VLDL: 50.4 mg/dL — ABNORMAL HIGH (ref 0.0–40.0)

## 2018-11-18 LAB — TSH: TSH: 7.31 u[IU]/mL — AB (ref 0.35–4.50)

## 2018-11-18 LAB — COMPREHENSIVE METABOLIC PANEL
ALBUMIN: 4.4 g/dL (ref 3.5–5.2)
ALT: 27 U/L (ref 0–35)
AST: 25 U/L (ref 0–37)
Alkaline Phosphatase: 74 U/L (ref 39–117)
BUN: 28 mg/dL — ABNORMAL HIGH (ref 6–23)
CO2: 24 mEq/L (ref 19–32)
Calcium: 9.9 mg/dL (ref 8.4–10.5)
Chloride: 100 mEq/L (ref 96–112)
Creatinine, Ser: 0.73 mg/dL (ref 0.40–1.20)
GFR: 78.39 mL/min (ref >60.00)
Glucose, Bld: 94 mg/dL (ref 70–99)
Potassium: 4.3 mEq/L (ref 3.5–5.1)
Sodium: 136 mEq/L (ref 135–145)
Total Bilirubin: 0.4 mg/dL (ref 0.2–1.2)
Total Protein: 7.6 g/dL (ref 6.0–8.3)

## 2018-11-18 LAB — HEMOGLOBIN A1C: Hgb A1c MFr Bld: 6 % (ref 4.6–6.5)

## 2018-11-18 LAB — LDL CHOLESTEROL, DIRECT: Direct LDL: 215 mg/dL

## 2018-11-18 MED ORDER — LEVOTHYROXINE SODIUM 112 MCG PO TABS: 112.0000 ug | ORAL_TABLET | Freq: Every day | ORAL | 1 refills | Status: DC

## 2018-11-18 NOTE — Assessment & Plan Note (Signed)
TSH is elevated, ,  Will increase her  levothyoxine dose,  Recheck in 6 montths   Lab Results  Component Value Date   TSH 7.31 (H) 11/17/2018

## 2018-11-18 NOTE — Assessment & Plan Note (Signed)
Patient is recovering from the  loss of her dog of 13 years, [name] Day and has adequate coping skills and emotional support .

## 2018-11-18 NOTE — Assessment & Plan Note (Signed)
Secondary to tobacco abuse in setting of childhood related asthma. Mild by PFTs  . Symptoms have resolved with  Regular  use of Breo.  She has had the appropriate vaccinations  

## 2018-11-18 NOTE — Assessment & Plan Note (Signed)
Well controlled on current regimenof lisinopril/hctz for age. Changing to telmisartan . Marland Kitchen Renal function is overdue ,  no changes today.  Lab Results  Component Value Date   CREATININE 0.73 11/17/2018   Lab Results  Component Value Date   NA 136 11/17/2018   K 4.3 11/17/2018   CL 100 11/17/2018   CO2 24 11/17/2018

## 2018-11-18 NOTE — Assessment & Plan Note (Signed)
Remains  well-controlled on diet alone, despite being lost to follow up.   hemoglobin A1c which has been consistently at or  less than 7.0 . Patient is up-to-date on eye exams and foot exam was normal today   Patient  has no microalbuminuria. Patient has avoided  statin therapy for CAD risk reduction but has tolerated  ACE/ARB for renal protection and hypertension   Lab Results  Component Value Date   HGBA1C 6.0 11/17/2018   Lab Results  Component Value Date   MICROALBUR 0.7 04/28/2018

## 2018-11-20 ENCOUNTER — Other Ambulatory Visit: Payer: Medicare HMO

## 2018-11-21 DIAGNOSIS — Z1211 Encounter for screening for malignant neoplasm of colon: Secondary | ICD-10-CM | POA: Diagnosis not present

## 2018-11-24 ENCOUNTER — Ambulatory Visit: Payer: Medicare HMO | Admitting: Internal Medicine

## 2018-12-04 ENCOUNTER — Encounter: Payer: Self-pay | Admitting: Internal Medicine

## 2018-12-04 LAB — COLOGUARD: Cologuard: NEGATIVE

## 2018-12-04 NOTE — Telephone Encounter (Signed)
The results of patient's cologuard is negative. Results abstracted and MyChart message sent  

## 2018-12-12 ENCOUNTER — Encounter: Payer: Self-pay | Admitting: Internal Medicine

## 2018-12-19 ENCOUNTER — Other Ambulatory Visit: Payer: Self-pay | Admitting: Internal Medicine

## 2018-12-25 ENCOUNTER — Other Ambulatory Visit: Payer: Self-pay | Admitting: Internal Medicine

## 2018-12-29 ENCOUNTER — Other Ambulatory Visit: Payer: Self-pay | Admitting: Internal Medicine

## 2018-12-31 ENCOUNTER — Other Ambulatory Visit (INDEPENDENT_AMBULATORY_CARE_PROVIDER_SITE_OTHER): Payer: Medicare HMO

## 2018-12-31 ENCOUNTER — Ambulatory Visit (INDEPENDENT_AMBULATORY_CARE_PROVIDER_SITE_OTHER): Payer: Medicare HMO | Admitting: Internal Medicine

## 2018-12-31 ENCOUNTER — Other Ambulatory Visit: Payer: Self-pay

## 2018-12-31 DIAGNOSIS — E1140 Type 2 diabetes mellitus with diabetic neuropathy, unspecified: Secondary | ICD-10-CM | POA: Diagnosis not present

## 2018-12-31 DIAGNOSIS — I10 Essential (primary) hypertension: Secondary | ICD-10-CM

## 2018-12-31 DIAGNOSIS — Z7189 Other specified counseling: Secondary | ICD-10-CM

## 2018-12-31 DIAGNOSIS — M064 Inflammatory polyarthropathy: Secondary | ICD-10-CM

## 2018-12-31 DIAGNOSIS — M2550 Pain in unspecified joint: Secondary | ICD-10-CM

## 2018-12-31 DIAGNOSIS — J439 Emphysema, unspecified: Secondary | ICD-10-CM | POA: Diagnosis not present

## 2018-12-31 DIAGNOSIS — E034 Atrophy of thyroid (acquired): Secondary | ICD-10-CM

## 2018-12-31 LAB — LIPID PANEL
Cholesterol: 302 mg/dL — ABNORMAL HIGH (ref 0–200)
HDL: 44.8 mg/dL (ref >39.00)
NonHDL: 257.51
Total CHOL/HDL Ratio: 7
Triglycerides: 378 mg/dL — ABNORMAL HIGH (ref 0.0–149.0)
VLDL: 75.6 mg/dL — ABNORMAL HIGH (ref 0.0–40.0)

## 2018-12-31 LAB — COMPREHENSIVE METABOLIC PANEL
ALT: 26 U/L (ref 0–35)
AST: 22 U/L (ref 0–37)
Albumin: 4.2 g/dL (ref 3.5–5.2)
Alkaline Phosphatase: 77 U/L (ref 39–117)
BUN: 22 mg/dL (ref 6–23)
CO2: 31 mEq/L (ref 19–32)
Calcium: 9.8 mg/dL (ref 8.4–10.5)
Chloride: 100 mEq/L (ref 96–112)
Creatinine, Ser: 0.75 mg/dL (ref 0.40–1.20)
GFR: 75.96 mL/min (ref >60.00)
Glucose, Bld: 108 mg/dL — ABNORMAL HIGH (ref 70–99)
Potassium: 3.9 mEq/L (ref 3.5–5.1)
Sodium: 140 mEq/L (ref 135–145)
Total Bilirubin: 0.4 mg/dL (ref 0.2–1.2)
Total Protein: 7.2 g/dL (ref 6.0–8.3)

## 2018-12-31 LAB — HEMOGLOBIN A1C: Hgb A1c MFr Bld: 6.1 % (ref 4.6–6.5)

## 2018-12-31 LAB — TSH: TSH: 9.18 u[IU]/mL — ABNORMAL HIGH (ref 0.35–4.50)

## 2018-12-31 NOTE — Patient Instructions (Signed)
Come in today for labs to check for a recurrent episode of inflammatory arthritis (vs the type we all get ).  IF your sedimentation rate is elevated,  I'll prescribe a tapering prednisone dose to take over the next week.    In the meantime, You can increase the celebrex to every 12 hours for a week ONLY .  Then reduce dose to once daily.  If I prescribe prednisone,  You will need to suspend the celebrex during the same time period    You can ALSO take up to 2000 mg of acetominophen (tylenol) every day safely  In divided doses (500 mg every 6 hours  Or 1000 mg every 12 hours.) THIS IS IN ADDITION TO THE CELEBREX  You can increase the gabapentin dose to 600 mg in the morning and afternoon,  And 900 mg at bedtime for your sciatica  I

## 2018-12-31 NOTE — Progress Notes (Signed)
Telephone  Visit via Telemedicne NOte   This visit type was conducted due to national recommendations for restrictions regarding the COVID-19 pandemic (e.g. social distancing).  This format is felt to be most appropriate for this patient at this time.  All issues noted in this document were discussed and addressed.  No physical exam was performed (except for noted visual exam findings with Video Visits).   I connected with@ on 01/01/19 at  8:00 AM EDT by a video enabled telemedicine application or telephone and verified that I am speaking with the correct person using two identifiers. Location patient: home Location provider: work or home office Persons participating in the virtual visit: patient, provider  I discussed the limitations, risks, security and privacy concerns of performing an evaluation and management service by telephone and the availability of in person appointments. I also discussed with the patient that there may be a patient responsible charge related to this service. The patient expressed understanding and agreed to proceed.  Interactive audio and video telecommunications were attempted between this provider and patient, however failed, due to patient having technical difficulties OR patient did not have access to video capability.  We continued and completed visit with audio only.   Reason for visit:  Increased joint pain.    HPI:  Patient states that or the last several week she has had increased difficulty walking due to bilateral  Hip and knee pain,  The pain is worse with inactivity and improves with walking,  She is taking celebrex,  Gabapentin and tylenol.  She denies back pain . No history of fall. jooints are not red or swollen, no fevers or body aches ,    She has a history of inflammatory arthritis that dates back to 1970's when she received an experimental vaccine for hepatitis called Hepta Vax.  Has not taken Placquenil for years and no recent rheumatology follow  up.  Activity has been severely diminished due to sheltering at home because of the COVID 19 EPIDEMIC   2) Patient is taking her medications as prescribed and notes no adverse effects.  Home BP readings have been done about once per week and are  generally < 130/80 .  She is avoiding added salt in her diet but not exercising regularly   3) 3 month follow up on diabetes.  Patient has no complaints today.  Patient is following a low glycemic index diet and taking all prescribed medications regularly without side effects.  Fasting sugars have been under less than 140 most of the time and post prandials have been under 160 except on rare occasions. Patient is exercising about 3 times per week and intentionally trying to lose weight .  Patient has had an eye exam in the last 12 months and checks feet regularly for signs of infection.  Patient does not walk barefoot outside,  And denies any numbness tingling or burning in feet. Patient is up to date on all recommended vaccinations  4) COPD:  She is currently asymptomatic using Breo.  She is sheltering at home.   ROS: See pertinent positives and negatives per HPI.  Past Medical History:  Diagnosis Date  . allergic rhinitis   . Arthritis    hands  . Cancer ([place])    skin  . COPD (chronic obstructive pulmonary disease) ([place])    PFTS 2013 FEV1 nearly normalized  . Depression   . Diabetes mellitus    diet controlled  . GERD (gastroesophageal reflux disease)   . Headache  sinus  . Hyperlipidemia   . Hypertension   . Hypothyroid 2002  . Lichen sclerosus et atrophicus of the vulva 01/2011  . Neuropathy    diabetic  . Raynauds disease   . Rheumatic fever    HX    Past Surgical History:  Procedure Laterality Date  . ABDOMINAL HYSTERECTOMY  1978  . BACK SURGERY  2000   repair cord damage after 1st back surgery  . COLONOSCOPY  2004  . ETHMOIDECTOMY Bilateral 01/24/2016   Procedure: TOTAL ETHMOIDECTOMY BILATERAL;  Surgeon: Clyde Canterbury, MD;   Location: [facility];  Service: ENT;  Laterality: Bilateral;  . FRONTAL SINUS EXPLORATION Bilateral 01/24/2016   Procedure: BILATERAL FRONTAL SINUSOTOMY;  Surgeon: Clyde Canterbury, MD;  Location: [place];  Service: ENT;  Laterality: Bilateral;  Latex sensitivity  . IMAGE GUIDED SINUS SURGERY N/A 01/24/2016   Procedure: IMAGE GUIDED SINUS SURGERY;  Surgeon: Clyde Canterbury, MD;  Location: [facility];  Service: ENT;  Laterality: N/A;  GAVE DISK TO [facility] 3/27  . MAXILLARY ANTROSTOMY Bilateral 01/24/2016   Procedure: MAXILLARY ANTROSTOMY BILATERAL ENDOSCOPICWITH TISSUE REMOVAL;  Surgeon: Clyde Canterbury, MD;  Location: [facility];  Service: ENT;  Laterality: Bilateral;  . SEPTOPLASTY N/A 01/24/2016   Procedure: SEPTOPLASTY NASAL;  Surgeon: Clyde Canterbury, MD;  Location: [facility];  Service: ENT;  Laterality: N/A;  . SPINE SURGERY     L3 to L5  . TONSILLECTOMY      Family History  Problem Relation Age of Onset  . Heart disease Mother        CHF and cardiomyopathy  . Thyroid disease Mother   . Hyperlipidemia Mother   . Hyperlipidemia Father   . Cancer Father        liver  . Lupus Sister   . Hyperlipidemia Sister   . Breast cancer Neg Hx     SOCIAL HX: lives alone,  IADL,  No alcohol or tobacco abuse    Current Outpatient Medications:  .  albuterol (PROAIR HFA) 108 (90 Base) MCG/ACT inhaler, Inhale 2 puffs into the lungs every 4 (four) hours as needed for wheezing., Disp: 1 Inhaler, Rfl: 2 .  celecoxib (CELEBREX) 200 MG capsule, TAKE 1 CAPSULE(200 MG) BY MOUTH DAILY, Disp: 30 capsule, Rfl: 0 .  cholecalciferol (VITAMIN D) 400 UNITS TABS tablet, Take 400 Units by mouth., Disp: , Rfl:  .  fexofenadine (ALLEGRA) 180 MG tablet, Take 180 mg by mouth daily., Disp: , Rfl:  .  fluticasone furoate-vilanterol (BREO ELLIPTA) 100-25 MCG/INH AEPB, Inhale 1 puff into the lungs daily., Disp: 60 each, Rfl: 5 .  gabapentin (NEURONTIN) 300 MG capsule, Take 1 capsule (300 mg  total) by mouth 3 (three) times daily., Disp: 360 capsule, Rfl: 3 .  hydrochlorothiazide (HYDRODIURIL) 25 MG tablet, Take 1 tablet (25 mg total) by mouth daily., Disp: 90 tablet, Rfl: 3 .  lactobacillus acidophilus (BACID) TABS tablet, Take 1 tablet by mouth daily., Disp: , Rfl:  .  Multiple Vitamins-Minerals (SENIOR MULTIVITAMIN PLUS) TABS, Take 1 tablet by mouth.  , Disp: , Rfl:  .  sertraline (ZOLOFT) 100 MG tablet, TAKE 1 TABLET(100 MG) BY MOUTH DAILY, Disp: 90 tablet, Rfl: 1 .  telmisartan (MICARDIS) 40 MG tablet, Take 1 tablet (40 mg total) by mouth at bedtime., Disp: 90 tablet, Rfl: 0 .  levothyroxine (SYNTHROID, LEVOTHROID) 125 MCG tablet, Take 1 tablet (125 mcg total) by mouth daily before breakfast., Disp: 90 tablet, Rfl: 0  EXAM:  VITALS per patient if  applicable: General impression : alert, cooperative and articulate.  No signs of being in distress  Lungs: not short of breath ,  No cough, speaking in full sentences  Psych: affect normal,dspeech is articulate and non pressured .  Denies suicidal thoughts   ASSESSMENT AND PLAN:  Discussed the following assessment and plan:  Inflammatory polyarthritis ([place]) - Plan: Sedimentation rate, CBC with Differential/Platelet, C-reactive protein  Pulmonary emphysema, unspecified emphysema type ([place])  Educated About Covid-19 Virus Infection  Joint pain of lower extremity  Essential hypertension  Type 2 diabetes mellitus with diabetic neuropathy, without long-term current use of insulin (HCC)  COPD (chronic obstructive pulmonary disease) ([place]) Secondary to tobacco abuse in setting of childhood related asthma. Mild by PFTs  . Symptoms have resolved with  Regular  use of Breo.  She has had the appropriate vaccinations   Educated About Covid-19 Virus Infection COVID-19 Education: The signs and symptoms of COVID-19 were discussed with the patient and how to seek care for testing (follow up with PCP or arrange E-visit).  The importance of  social distancing was discussed today  Joint pain of lower extremity Symptoms suggestive of OA,  ,  But given her history and persistent pain despite using celebrex and tylenol, will check ESR    Hypertension Well controlled by home readings on current regimenof lisinopril/hctz for age. Changing to telmisartan . Marland Kitchen Renal function is normal.   Lab Results  Component Value Date   CREATININE 0.75 12/31/2018   Lab Results  Component Value Date   NA 140 12/31/2018   K 3.9 12/31/2018   CL 100 12/31/2018   CO2 31 12/31/2018     Diabetes mellitus with neuropathy Remains  well-controlled on diet alone, despite being lost to follow up.   hemoglobin A1c which has been consistently at or  less than 7.0 . Patient is up-to-date on eye exams and foot exam was normal today   Patient  has no microalbuminuria. Patient has avoided  statin therapy for CAD risk reduction but has tolerated  ACE/ARB for renal protection and hypertension   Lab Results  Component Value Date   HGBA1C 6.1 12/31/2018   Lab Results  Component Value Date   MICROALBUR 0.7 04/28/2018       I discussed the assessment and treatment plan with the patient. The patient was provided an opportunity to ask questions and all were answered. The patient agreed with the plan and demonstrated an understanding of the instructions.   The patient was advised to call back or seek an in-person evaluation if the symptoms worsen or if the condition fails to improve as anticipated.  I provided 20 minutes of non-face-to-face time during this encounter.   Crecencio Mc, MD

## 2019-01-01 ENCOUNTER — Other Ambulatory Visit: Payer: Self-pay | Admitting: Internal Medicine

## 2019-01-01 DIAGNOSIS — M1711 Unilateral primary osteoarthritis, right knee: Secondary | ICD-10-CM | POA: Insufficient documentation

## 2019-01-01 DIAGNOSIS — Z7189 Other specified counseling: Secondary | ICD-10-CM | POA: Insufficient documentation

## 2019-01-01 DIAGNOSIS — M2550 Pain in unspecified joint: Secondary | ICD-10-CM | POA: Insufficient documentation

## 2019-01-01 DIAGNOSIS — E782 Mixed hyperlipidemia: Secondary | ICD-10-CM

## 2019-01-01 DIAGNOSIS — E039 Hypothyroidism, unspecified: Secondary | ICD-10-CM

## 2019-01-01 LAB — LDL CHOLESTEROL, DIRECT: Direct LDL: 210 mg/dL

## 2019-01-01 MED ORDER — LEVOTHYROXINE SODIUM 125 MCG PO TABS: 125.0000 ug | ORAL_TABLET | Freq: Every day | ORAL | 0 refills | Status: DC

## 2019-01-01 NOTE — Assessment & Plan Note (Signed)
Symptoms suggestive of OA,  ,  But given her history and persistent pain despite using celebrex and tylenol, will check ESR

## 2019-01-01 NOTE — Assessment & Plan Note (Signed)
Remains  well-controlled on diet alone, despite being lost to follow up.   hemoglobin A1c which has been consistently at or  less than 7.0 . Patient is up-to-date on eye exams and foot exam was normal today   Patient  has no microalbuminuria. Patient has avoided  statin therapy for CAD risk reduction but has tolerated  ACE/ARB for renal protection and hypertension   Lab Results  Component Value Date   HGBA1C 6.1 12/31/2018   Lab Results  Component Value Date   MICROALBUR 0.7 04/28/2018

## 2019-01-01 NOTE — Assessment & Plan Note (Signed)
COVID-19 Education: The signs and symptoms of COVID-19 were discussed with the patient and how to seek care for testing (follow up with PCP or arrange E-visit).  The importance of social distancing was discussed today 

## 2019-01-01 NOTE — Assessment & Plan Note (Signed)
Well controlled by home readings on current regimenof lisinopril/hctz for age. Changing to telmisartan . Marland Kitchen Renal function is normal.   Lab Results  Component Value Date   CREATININE 0.75 12/31/2018   Lab Results  Component Value Date   NA 140 12/31/2018   K 3.9 12/31/2018   CL 100 12/31/2018   CO2 31 12/31/2018

## 2019-01-01 NOTE — Assessment & Plan Note (Signed)
Secondary to tobacco abuse in setting of childhood related asthma. Mild by PFTs  . Symptoms have resolved with  Regular  use of Breo.  She has had the appropriate vaccinations

## 2019-01-02 ENCOUNTER — Other Ambulatory Visit (INDEPENDENT_AMBULATORY_CARE_PROVIDER_SITE_OTHER): Payer: Medicare HMO

## 2019-01-02 DIAGNOSIS — M064 Inflammatory polyarthropathy: Secondary | ICD-10-CM | POA: Diagnosis not present

## 2019-01-02 LAB — C-REACTIVE PROTEIN: CRP: <1 mg/dL (ref 0.5–20.0)

## 2019-01-02 NOTE — Addendum Note (Signed)
Addended by: Leeanne Rio on: 01/02/2019 08:28 AM   Modules accepted: Orders

## 2019-01-08 ENCOUNTER — Other Ambulatory Visit (INDEPENDENT_AMBULATORY_CARE_PROVIDER_SITE_OTHER): Payer: Medicare HMO

## 2019-01-08 ENCOUNTER — Other Ambulatory Visit: Payer: Self-pay

## 2019-01-08 ENCOUNTER — Other Ambulatory Visit: Payer: Self-pay | Admitting: Internal Medicine

## 2019-01-08 DIAGNOSIS — E039 Hypothyroidism, unspecified: Secondary | ICD-10-CM

## 2019-01-08 DIAGNOSIS — M064 Inflammatory polyarthropathy: Secondary | ICD-10-CM | POA: Diagnosis not present

## 2019-01-08 DIAGNOSIS — E782 Mixed hyperlipidemia: Secondary | ICD-10-CM

## 2019-01-08 LAB — CBC WITH DIFFERENTIAL/PLATELET
Basophils Absolute: 0.1 10*3/uL (ref 0.0–0.1)
Basophils Relative: 1.3 % (ref 0.0–3.0)
Eosinophils Absolute: 0.2 10*3/uL (ref 0.0–0.7)
Eosinophils Relative: 2.2 % (ref 0.0–5.0)
HCT: 45.4 % (ref 36.0–46.0)
Hemoglobin: 15.4 g/dL — ABNORMAL HIGH (ref 12.0–15.0)
Lymphocytes Relative: 20 % (ref 12.0–46.0)
Lymphs Abs: 1.8 10*3/uL (ref 0.7–4.0)
MCHC: 33.8 g/dL (ref 30.0–36.0)
MCV: 86.5 fl (ref 78.0–100.0)
Monocytes Absolute: 1 10*3/uL (ref 0.1–1.0)
Monocytes Relative: 11.5 % (ref 3.0–12.0)
Neutro Abs: 5.9 10*3/uL (ref 1.4–7.7)
Neutrophils Relative %: 65 % (ref 43.0–77.0)
Platelets: 253 10*3/uL (ref 150.0–400.0)
RBC: 5.25 Mil/uL — ABNORMAL HIGH (ref 3.87–5.11)
RDW: 14.4 % (ref 11.5–15.5)
WBC: 9.1 10*3/uL (ref 4.0–10.5)

## 2019-01-08 LAB — LDL CHOLESTEROL, DIRECT: Direct LDL: 187 mg/dL

## 2019-01-08 LAB — SEDIMENTATION RATE: Sed Rate: 4 mm/hr (ref 0–30)

## 2019-01-08 LAB — LIPID PANEL
Cholesterol: 268 mg/dL — ABNORMAL HIGH (ref 0–200)
HDL: 44.5 mg/dL (ref >39.00)
NonHDL: 223.43
Total CHOL/HDL Ratio: 6
Triglycerides: 232 mg/dL — ABNORMAL HIGH (ref 0.0–149.0)
VLDL: 46.4 mg/dL — ABNORMAL HIGH (ref 0.0–40.0)

## 2019-01-08 LAB — TSH: TSH: 7.23 u[IU]/mL — ABNORMAL HIGH (ref 0.35–4.50)

## 2019-01-08 MED ORDER — LEVOTHYROXINE SODIUM 137 MCG PO TABS: 137.0000 ug | ORAL_TABLET | Freq: Every day | ORAL | 0 refills | Status: DC

## 2019-01-08 NOTE — Addendum Note (Signed)
Addended by: Crecencio Mc on: 01/08/2019 03:06 PM   Modules accepted: Orders

## 2019-01-26 ENCOUNTER — Telehealth: Payer: Self-pay | Admitting: *Deleted

## 2019-01-26 NOTE — Telephone Encounter (Signed)
Copied from [place] [id number]. Topic: General - Other >> Jan 26, 2019  8:22 AM [name] Day wrote: Reason for CRM: Patient called to say that the issue that she previously spoke to Dr Derrel Nip about with her right leg. She states that it is worst now and starting to affect the rest of her body. Asking what to do

## 2019-01-27 ENCOUNTER — Other Ambulatory Visit: Payer: Self-pay

## 2019-01-27 ENCOUNTER — Ambulatory Visit (INDEPENDENT_AMBULATORY_CARE_PROVIDER_SITE_OTHER): Payer: Medicare HMO | Admitting: Family Medicine

## 2019-01-27 ENCOUNTER — Encounter: Payer: Self-pay | Admitting: Family Medicine

## 2019-01-27 DIAGNOSIS — M5441 Lumbago with sciatica, right side: Secondary | ICD-10-CM

## 2019-01-27 MED ORDER — TRAMADOL HCL 50 MG PO TABS: 50.0000 mg | ORAL_TABLET | Freq: Four times a day (QID) | ORAL | 0 refills | Status: DC | PRN

## 2019-01-27 NOTE — Telephone Encounter (Addendum)
Patient said last time she talked to PCP she thought it was her sciatic nerve bothering her, Right leg said PCP said she  She could prescribe tramadol , patient says the pain much worse. Patient rated pain at a 10 in that leg and sometimes it folds on her due to the pain. Scheduled with NP this morning.

## 2019-01-27 NOTE — Progress Notes (Signed)
Patient ID: [name] Day, female   DOB: Aug 28, 1946, 73 y.o.   MRN: [id number]    Virtual Visit via phone Note  This visit type was conducted due to national recommendations for restrictions regarding the COVID-19 pandemic (e.g. social distancing).  This format is felt to be most appropriate for this patient at this time.  All issues noted in this document were discussed and addressed.  No physical exam was performed (except for noted visual exam findings with Video Visits).   I connected with [name] Day today at 10:40 AM EDT by telephone and verified that I am speaking with the correct person using two identifiers. Location patient: home Location provider: [place] Persons participating in the virtual visit: patient, provider  I discussed the limitations, risks, security and privacy concerns of performing an evaluation and management service by telephone and the availability of in person appointments. I also discussed with the patient that there may be a patient responsible charge related to this service. The patient expressed understanding and agreed to proceed.  HPI:  Patient and I connected via phone due to complaints of right-sided low back pain that radiates down her right leg that has waxed and waned for many weeks.  Patient has been seen by Dr. Colletta Maryland for this, and it was suggested she try tramadol.  However patient want to try other remedies before going to tramadol.  Patient has diagnosis of inflammatory prior arthritis and she takes Celebrex daily for this.  Also her gabapentin was recently increased to see if this would help reduce the right-sided back pain and right leg pain, but does not make much of a difference.  Patient has tried topical rubs like Aspercreme, heating pad and ice without much relief in symptoms.  Patient would like something else on board to help with pain so she can continue to remain active.  Denies numbness or tingling in leg.  Denies loss of bowel  or bladder control.  Denies any recent falls.  Denies fever or chills.  Denies cough, shortness of breath or wheezing.  Denies chest pain.  ROS: See pertinent positives and negatives per HPI.  Past Medical History:  Diagnosis Date  . allergic rhinitis   . Arthritis    hands  . Cancer ([place])    skin  . COPD (chronic obstructive pulmonary disease) ([place])    PFTS 2013 FEV1 nearly normalized  . Depression   . Diabetes mellitus    diet controlled  . GERD (gastroesophageal reflux disease)   . Headache    sinus  . Hyperlipidemia   . Hypertension   . Hypothyroid 2002  . Lichen sclerosus et atrophicus of the vulva 01/2011  . Neuropathy    diabetic  . Raynauds disease   . Rheumatic fever    HX    Past Surgical History:  Procedure Laterality Date  . ABDOMINAL HYSTERECTOMY  1978  . BACK SURGERY  2000   repair cord damage after 1st back surgery  . COLONOSCOPY  2004  . ETHMOIDECTOMY Bilateral 01/24/2016   Procedure: TOTAL ETHMOIDECTOMY BILATERAL;  Surgeon: Clyde Canterbury, MD;  Location: [place];  Service: ENT;  Laterality: Bilateral;  . FRONTAL SINUS EXPLORATION Bilateral 01/24/2016   Procedure: BILATERAL FRONTAL SINUSOTOMY;  Surgeon: Clyde Canterbury, MD;  Location: [place];  Service: ENT;  Laterality: Bilateral;  Latex sensitivity  . IMAGE GUIDED SINUS SURGERY N/A 01/24/2016   Procedure: IMAGE GUIDED SINUS SURGERY;  Surgeon: Clyde Canterbury, MD;  Location: [place];  Service:  ENT;  Laterality: N/A;  GAVE DISK TO [facility] 3/27  . MAXILLARY ANTROSTOMY Bilateral 01/24/2016   Procedure: MAXILLARY ANTROSTOMY BILATERAL ENDOSCOPICWITH TISSUE REMOVAL;  Surgeon: Clyde Canterbury, MD;  Location: [place];  Service: ENT;  Laterality: Bilateral;  . SEPTOPLASTY N/A 01/24/2016   Procedure: SEPTOPLASTY NASAL;  Surgeon: Clyde Canterbury, MD;  Location: [place];  Service: ENT;  Laterality: N/A;  . SPINE SURGERY     L3 to L5  . TONSILLECTOMY      Family History  Problem  Relation Age of Onset  . Heart disease Mother        CHF and cardiomyopathy  . Thyroid disease Mother   . Hyperlipidemia Mother   . Hyperlipidemia Father   . Cancer Father        liver  . Lupus Sister   . Hyperlipidemia Sister   . Breast cancer Neg Hx    Social History   Tobacco Use  . Smoking status: Former Smoker    Packs/day: 0.50    Years: 25.00    Pack years: 12.50    Types: Cigarettes    Last attempt to quit: 09/17/1993    Years since quitting: 25.3  . Smokeless tobacco: Never Used  Substance Use Topics  . Alcohol use: No    Current Outpatient Medications:  .  albuterol (PROAIR HFA) 108 (90 Base) MCG/ACT inhaler, Inhale 2 puffs into the lungs every 4 (four) hours as needed for wheezing., Disp: 1 Inhaler, Rfl: 2 .  celecoxib (CELEBREX) 200 MG capsule, TAKE 1 CAPSULE(200 MG) BY MOUTH DAILY, Disp: 30 capsule, Rfl: 0 .  cholecalciferol (VITAMIN D) 400 UNITS TABS tablet, Take 400 Units by mouth., Disp: , Rfl:  .  fexofenadine (ALLEGRA) 180 MG tablet, Take 180 mg by mouth daily., Disp: , Rfl:  .  fluticasone furoate-vilanterol (BREO ELLIPTA) 100-25 MCG/INH AEPB, Inhale 1 puff into the lungs daily., Disp: 60 each, Rfl: 5 .  gabapentin (NEURONTIN) 300 MG capsule, Take 1 capsule (300 mg total) by mouth 3 (three) times daily., Disp: 360 capsule, Rfl: 3 .  hydrochlorothiazide (HYDRODIURIL) 25 MG tablet, Take 1 tablet (25 mg total) by mouth daily., Disp: 90 tablet, Rfl: 3 .  lactobacillus acidophilus (BACID) TABS tablet, Take 1 tablet by mouth daily., Disp: , Rfl:  .  levothyroxine (SYNTHROID) 137 MCG tablet, Take 1 tablet (137 mcg total) by mouth daily before breakfast., Disp: 90 tablet, Rfl: 0 .  Multiple Vitamins-Minerals (SENIOR MULTIVITAMIN PLUS) TABS, Take 1 tablet by mouth.  , Disp: , Rfl:  .  sertraline (ZOLOFT) 100 MG tablet, TAKE 1 TABLET(100 MG) BY MOUTH DAILY, Disp: 90 tablet, Rfl: 1 .  telmisartan (MICARDIS) 40 MG tablet, Take 1 tablet (40 mg total) by mouth at bedtime.,  Disp: 90 tablet, Rfl: 0  EXAM:  GENERAL: alert, oriented, sounds well and in no acute distress  LUNGS: Speaking in full sentences, no signs of respiratory distress, breathing rate appears normal, no obvious SOB, gasping, coughing or wheezing  PSYCH/NEURO: pleasant and cooperative, no obvious depression or anxiety, speech and thought processing grossly intact  ASSESSMENT AND PLAN:  Discussed the following assessment and plan:  Right-sided low back pain with right-sided sciatica, unspecified chronicity - Plan: traMADol (ULTRAM) 50 MG tablet   Due to description of patient's pain I do suspect she has right-sided sciatica that is causing the pain and right-sided low back radiating down right leg.  She will continue Celebrex daily, and we will have her take tramadol as needed for  more severe pain.  Also recommend she continue use of heating pad or topical rubs like BenGay, Aspercreme or Biofreeze and also recommended she do daily stretches to help keep muscles and bones from becoming too stiff and tight.  Also discussed possibility of going to physical therapy once the coronavirus recommendations or restrictions are lifted.   I discussed the assessment and treatment plan with the patient. The patient was provided an opportunity to ask questions and all were answered. The patient agreed with the plan and demonstrated an understanding of the instructions.   The patient was advised to call back or seek an in-person evaluation if the symptoms worsen or if the condition fails to improve as anticipated.  I provided 15 minutes of non-face-to-face time during this encounter.   Jodelle Green, FNP

## 2019-01-28 ENCOUNTER — Other Ambulatory Visit: Payer: Self-pay | Admitting: Internal Medicine

## 2019-02-11 ENCOUNTER — Other Ambulatory Visit: Payer: Self-pay

## 2019-02-11 DIAGNOSIS — E039 Hypothyroidism, unspecified: Secondary | ICD-10-CM

## 2019-02-11 MED ORDER — LEVOTHYROXINE SODIUM 137 MCG PO TABS: 137.0000 ug | ORAL_TABLET | Freq: Every day | ORAL | 0 refills | Status: DC

## 2019-02-16 ENCOUNTER — Other Ambulatory Visit (INDEPENDENT_AMBULATORY_CARE_PROVIDER_SITE_OTHER): Payer: Medicare HMO

## 2019-02-16 ENCOUNTER — Other Ambulatory Visit: Payer: Self-pay

## 2019-02-16 DIAGNOSIS — E039 Hypothyroidism, unspecified: Secondary | ICD-10-CM

## 2019-02-16 LAB — TSH: TSH: 1.95 u[IU]/mL (ref 0.35–4.50)

## 2019-02-18 ENCOUNTER — Other Ambulatory Visit: Payer: Self-pay

## 2019-02-18 MED ORDER — TELMISARTAN 40 MG PO TABS: 40.0000 mg | ORAL_TABLET | Freq: Every day | ORAL | 1 refills | Status: DC

## 2019-03-25 ENCOUNTER — Other Ambulatory Visit: Payer: Self-pay | Admitting: Internal Medicine

## 2019-03-25 DIAGNOSIS — E039 Hypothyroidism, unspecified: Secondary | ICD-10-CM

## 2019-04-03 ENCOUNTER — Other Ambulatory Visit: Payer: Self-pay | Admitting: Internal Medicine

## 2019-04-03 DIAGNOSIS — E039 Hypothyroidism, unspecified: Secondary | ICD-10-CM

## 2019-04-07 ENCOUNTER — Other Ambulatory Visit: Payer: Self-pay

## 2019-04-07 MED ORDER — GABAPENTIN 300 MG PO CAPS: ORAL_CAPSULE | ORAL | 1 refills | Status: DC

## 2019-06-21 ENCOUNTER — Other Ambulatory Visit: Payer: Self-pay | Admitting: Family Medicine

## 2019-06-21 ENCOUNTER — Other Ambulatory Visit: Payer: Self-pay | Admitting: Internal Medicine

## 2019-06-21 DIAGNOSIS — M5441 Lumbago with sciatica, right side: Secondary | ICD-10-CM

## 2019-06-22 NOTE — Telephone Encounter (Signed)
Not in current medication list.  Refilled: 06/23/2018 Last OV: 01/27/2019 Next OV: 11/18/2019

## 2019-06-23 ENCOUNTER — Other Ambulatory Visit: Payer: Self-pay

## 2019-06-23 MED ORDER — CELECOXIB 200 MG PO CAPS: ORAL_CAPSULE | ORAL | 2 refills | Status: DC

## 2019-07-31 IMAGING — US US BREAST*L* LIMITED INC AXILLA
1 series · 1 of 1 positions shown · non-contrast
Comparison: Previous exam(s).

CLINICAL DATA: 71-year-old patient reports a recent several week
history of upper outer quadrant left breast pain radiating toward
the nipple, with some nipple itching. She states that on 4 occasions
when she was having pain she applied some pressure/squeezed the left
nipple, and had some gummy and cloudy nipple discharge, in very
small amounts expressed from the left nipple. She denies any
spontaneous nipple discharge. She denies any bloody nipple
discharge. She does not palpate a breast mass.

EXAM:
DIGITAL DIAGNOSTIC BILATERAL MAMMOGRAM WITH CAD AND TOMO
ULTRASOUND LEFT BREAST

[Series 1: us breast*left* limited inc axilla · 0.05mm/px · 1 of 1 slices shown]
[im 1/1]
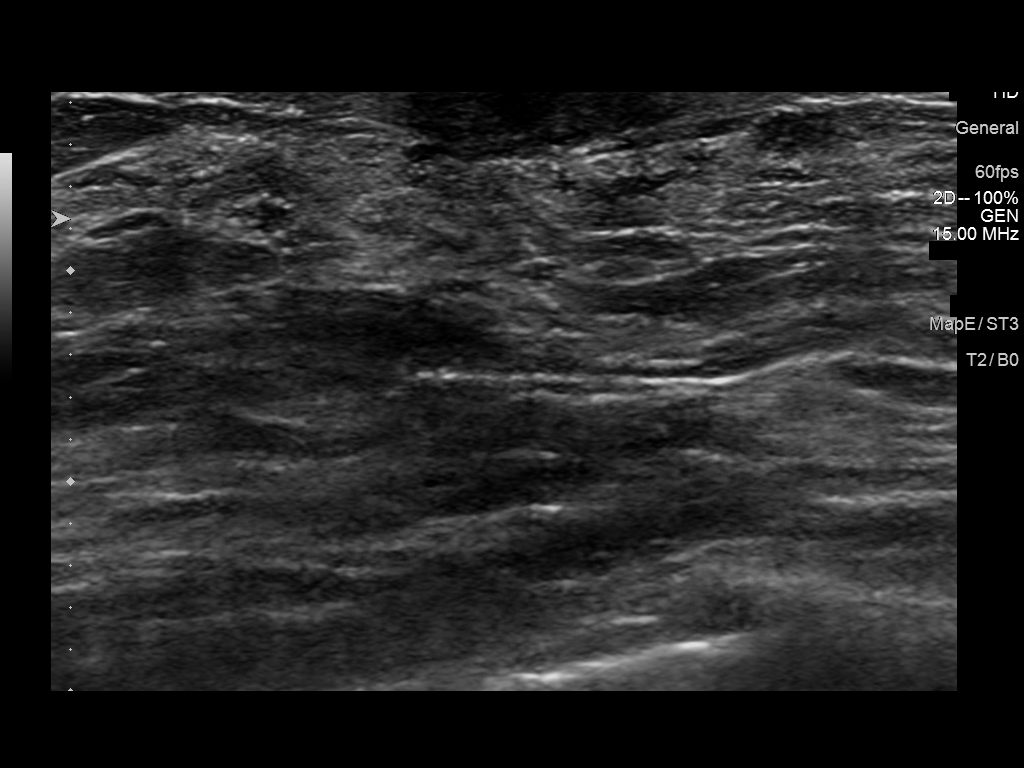

[1 of 1 positions shown; findings below may reference images not displayed]

ACR Breast Density Category c: The breast tissue is heterogeneously
dense, which may obscure small masses.
FINDINGS: The region of recent left breast pain was denoted with a metallic
skin marker. The parenchymal pattern of the left breast appears
stable. No mass, architectural distortion, or suspicious
microcalcification is identified in the left breast to suggest
malignancy. Negative for skin thickening. A spot tangential view of
the region of pain in the upper-outer left breast is negative. No
discharge was elicited with compression by the mammogram.

The right breast parenchymal pattern is stable. No mass,
architectural distortion, or suspicious microcalcification is
identified in the right breast.

Mammographic images were processed with CAD.

On physical exam, the upper-outer quadrant of the left breast and
periareolar left breast is soft to palpation. I do not palpate a
mass. The skin of the left breast and the nipple areolar complex
appear normal. I attempted to manually express discharge from the
left nipple. No discharge is expressed.

Targeted ultrasound is performed, showing normal fibroglandular
tissue in the upper-outer quadrant of the left breast and the
retroareolar left breast. No dilated ducts, intraductal mass,
parenchymal mass, cyst, or fluid collection is present.
IMPRESSION: No evidence of malignancy in either breast.

I discussed with the patient that in the vast majority of cases,
breast pain without a lump is not associated with malignancy. We
discussed that spontaneous nipple discharge is more suspicious than
manually expressed discharge. The patient has not had any
spontaneous nipple discharge. The patient is aware that she should
return for further evaluation if she should have any spontaneous or
bloody nipple discharge or palpate a mass in either breast.

RECOMMENDATION:
Screening mammogram in one year.(Code:DK-8-Y7H)

I have discussed the findings and recommendations with the patient.
Results were also provided in writing at the conclusion of the
visit. If applicable, a reminder letter will be sent to the patient
regarding the next appointment.

BI-RADS CATEGORY  1: Negative.

## 2019-08-18 DIAGNOSIS — Z20828 Contact with and (suspected) exposure to other viral communicable diseases: Secondary | ICD-10-CM | POA: Diagnosis not present

## 2019-08-20 ENCOUNTER — Telehealth: Payer: Self-pay | Admitting: Internal Medicine

## 2019-08-20 ENCOUNTER — Other Ambulatory Visit: Payer: Self-pay

## 2019-08-20 DIAGNOSIS — E1140 Type 2 diabetes mellitus with diabetic neuropathy, unspecified: Secondary | ICD-10-CM

## 2019-08-20 DIAGNOSIS — E039 Hypothyroidism, unspecified: Secondary | ICD-10-CM

## 2019-08-20 DIAGNOSIS — E782 Mixed hyperlipidemia: Secondary | ICD-10-CM

## 2019-08-20 DIAGNOSIS — I10 Essential (primary) hypertension: Secondary | ICD-10-CM

## 2019-08-20 MED ORDER — TELMISARTAN 40 MG PO TABS: 40.0000 mg | ORAL_TABLET | Freq: Every day | ORAL | 1 refills | Status: DC

## 2019-08-20 NOTE — Telephone Encounter (Signed)
Pt sent a Mychart message wanting to have A1C labs done. Order is needed please and Thank you!

## 2019-08-20 NOTE — Addendum Note (Signed)
Addended by: Adair Laundry on: 08/20/2019 03:35 PM   Modules accepted: Orders

## 2019-08-20 NOTE — Telephone Encounter (Signed)
Pt is requesting to have her lab done. I have ordered TSH, Lipid, A1c, and CMP. Is there anything else that needs to be ordered?

## 2019-08-29 ENCOUNTER — Other Ambulatory Visit: Payer: Self-pay | Admitting: Internal Medicine

## 2019-10-08 ENCOUNTER — Other Ambulatory Visit: Payer: Self-pay | Admitting: Internal Medicine

## 2019-10-08 DIAGNOSIS — E039 Hypothyroidism, unspecified: Secondary | ICD-10-CM

## 2019-10-20 ENCOUNTER — Other Ambulatory Visit: Payer: Self-pay | Admitting: Internal Medicine

## 2019-10-23 ENCOUNTER — Other Ambulatory Visit: Payer: Self-pay | Admitting: Internal Medicine

## 2019-10-27 ENCOUNTER — Other Ambulatory Visit: Payer: Self-pay | Admitting: Internal Medicine

## 2019-11-09 ENCOUNTER — Encounter: Payer: Self-pay | Admitting: Emergency Medicine

## 2019-11-09 ENCOUNTER — Other Ambulatory Visit: Payer: Self-pay

## 2019-11-09 ENCOUNTER — Emergency Department
Admission: EM | Admit: 2019-11-09 | Discharge: 2019-11-09 | Disposition: A | Discharge status: Home / Self Care | Payer: Medicare HMO | Attending: Emergency Medicine | Admitting: Emergency Medicine

## 2019-11-09 DIAGNOSIS — J449 Chronic obstructive pulmonary disease, unspecified: Secondary | ICD-10-CM | POA: Insufficient documentation

## 2019-11-09 DIAGNOSIS — I10 Essential (primary) hypertension: Secondary | ICD-10-CM | POA: Diagnosis not present

## 2019-11-09 DIAGNOSIS — E039 Hypothyroidism, unspecified: Secondary | ICD-10-CM | POA: Diagnosis not present

## 2019-11-09 DIAGNOSIS — R040 Epistaxis: Secondary | ICD-10-CM | POA: Diagnosis not present

## 2019-11-09 DIAGNOSIS — Z87891 Personal history of nicotine dependence: Secondary | ICD-10-CM | POA: Insufficient documentation

## 2019-11-09 DIAGNOSIS — Z91040 Latex allergy status: Secondary | ICD-10-CM | POA: Diagnosis not present

## 2019-11-09 DIAGNOSIS — Z85820 Personal history of malignant melanoma of skin: Secondary | ICD-10-CM | POA: Insufficient documentation

## 2019-11-09 DIAGNOSIS — Z79899 Other long term (current) drug therapy: Secondary | ICD-10-CM | POA: Diagnosis not present

## 2019-11-09 DIAGNOSIS — E119 Type 2 diabetes mellitus without complications: Secondary | ICD-10-CM | POA: Diagnosis not present

## 2019-11-09 LAB — CBC WITH DIFFERENTIAL/PLATELET
Abs Immature Granulocytes: 0.12 10*3/uL — ABNORMAL HIGH (ref 0.00–0.07)
Basophils Absolute: 0.1 10*3/uL (ref 0.0–0.1)
Basophils Relative: 1 %
Eosinophils Absolute: 0.2 10*3/uL (ref 0.0–0.5)
Eosinophils Relative: 1 %
HCT: 46.3 % — ABNORMAL HIGH (ref 36.0–46.0)
Hemoglobin: 15.4 g/dL — ABNORMAL HIGH (ref 12.0–15.0)
Immature Granulocytes: 1 %
Lymphocytes Relative: 14 %
Lymphs Abs: 1.7 10*3/uL (ref 0.7–4.0)
MCH: 28.3 pg (ref 26.0–34.0)
MCHC: 33.3 g/dL (ref 30.0–36.0)
MCV: 85.1 fL (ref 80.0–100.0)
Monocytes Absolute: 1 10*3/uL (ref 0.1–1.0)
Monocytes Relative: 8 %
Neutro Abs: 8.8 10*3/uL — ABNORMAL HIGH (ref 1.7–7.7)
Neutrophils Relative %: 75 %
Platelets: 275 10*3/uL (ref 150–400)
RBC: 5.44 MIL/uL — ABNORMAL HIGH (ref 3.87–5.11)
RDW: 13.5 % (ref 11.5–15.5)
WBC: 11.9 10*3/uL — ABNORMAL HIGH (ref 4.0–10.5)
nRBC: 0 % (ref 0.0–0.2)

## 2019-11-09 LAB — BASIC METABOLIC PANEL
Anion gap: 9 (ref 5–15)
BUN: 24 mg/dL — ABNORMAL HIGH (ref 8–23)
CO2: 28 mmol/L (ref 22–32)
Calcium: 9.2 mg/dL (ref 8.9–10.3)
Chloride: 100 mmol/L (ref 98–111)
Creatinine, Ser: 0.72 mg/dL (ref 0.44–1.00)
GFR calc Af Amer: >60 mL/min (ref >60)
GFR calc non Af Amer: >60 mL/min (ref >60)
Glucose, Bld: 107 mg/dL — ABNORMAL HIGH (ref 70–99)
Potassium: 4.3 mmol/L (ref 3.5–5.1)
Sodium: 137 mmol/L (ref 135–145)

## 2019-11-09 LAB — PROTIME-INR
INR: 0.9 (ref 0.8–1.2)
Prothrombin Time: 11.8 seconds (ref 11.4–15.2)

## 2019-11-09 MED ORDER — OXYMETAZOLINE HCL 0.05 % NA SOLN: 3.0000 | Freq: Two times a day (BID) | NASAL | 2 refills | Status: AC | PRN

## 2019-11-09 NOTE — ED Provider Notes (Signed)
[facility] Emergency Department Provider Note ____________________________________________   First MD Initiated Contact with Patient 11/09/19 1410     (approximate)  I have reviewed the triage vital signs and the nursing notes.   HISTORY  Chief Complaint Epistaxis  HPI [name] Day is a 74 y.o. female presents to the emergency department for treatment and evaluation of epistaxis. Bleeding started this morning and continued for over 2 hours. Bleeding started in the right side and eventually crossed over to the left as well. She had a few large clots as well. No bleeding at this time. It stopped while en route to the hospital. She denise trauma and is not currently anticoagulated. She has a history of polycythemia. Last labs drawn nearly a year ago. She has had some intermittent headaches recently. Unsure if BP has been elevated.       Past Medical History:  Diagnosis Date  . allergic rhinitis   . Arthritis    hands  . Cancer ([place])    skin  . COPD (chronic obstructive pulmonary disease) ([place])    PFTS 2013 FEV1 nearly normalized  . Depression   . Diabetes mellitus    diet controlled  . GERD (gastroesophageal reflux disease)   . Headache    sinus  . Hyperlipidemia   . Hypertension   . Hypothyroid 2002  . Lichen sclerosus et atrophicus of the vulva 01/2011  . Neuropathy    diabetic  . Raynauds disease   . Rheumatic fever    HX    Patient Active Problem List   Diagnosis Date Noted  . Educated about COVID-19 virus infection 01/01/2019  . Joint pain of lower extremity 01/01/2019  . Tachycardia 06/24/2018  . Grief 06/24/2018  . Tubular adenoma of colon 02/19/2017  . Neurosis, anxiety, generalized 02/19/2017  . Statin intolerance 01/16/2015  . Polycythemia, secondary 01/16/2015  . Hypothyroidism 01/13/2015  . Plantar fasciitis of right foot 10/21/2013  . Former smoker 04/09/2013  . Palpitations 03/25/2013  . Routine general medical  examination at a health care facility 08/30/2012  . Inflammatory polyarthritis ([place]) 05/12/2012  . Diabetes mellitus with neuropathy ([place]) 02/10/2012  . GERD (gastroesophageal reflux disease)   . Hypertension   . Allergic rhinitis 01/14/2012  . Anxiety state 05/14/2011  . Osteopenia 03/01/2011  . COPD (chronic obstructive pulmonary disease) ([place]) 01/31/2011  . Mixed hyperlipidemia 01/31/2011    Past Surgical History:  Procedure Laterality Date  . ABDOMINAL HYSTERECTOMY  1978  . BACK SURGERY  2000   repair cord damage after 1st back surgery  . COLONOSCOPY  2004  . ETHMOIDECTOMY Bilateral 01/24/2016   Procedure: TOTAL ETHMOIDECTOMY BILATERAL;  Surgeon: Clyde Canterbury, MD;  Location: [place];  Service: ENT;  Laterality: Bilateral;  . FRONTAL SINUS EXPLORATION Bilateral 01/24/2016   Procedure: BILATERAL FRONTAL SINUSOTOMY;  Surgeon: Clyde Canterbury, MD;  Location: [place];  Service: ENT;  Laterality: Bilateral;  Latex sensitivity  . IMAGE GUIDED SINUS SURGERY N/A 01/24/2016   Procedure: IMAGE GUIDED SINUS SURGERY;  Surgeon: Clyde Canterbury, MD;  Location: [place];  Service: ENT;  Laterality: N/A;  GAVE DISK TO [facility] 3/27  . MAXILLARY ANTROSTOMY Bilateral 01/24/2016   Procedure: MAXILLARY ANTROSTOMY BILATERAL ENDOSCOPICWITH TISSUE REMOVAL;  Surgeon: Clyde Canterbury, MD;  Location: [place];  Service: ENT;  Laterality: Bilateral;  . SEPTOPLASTY N/A 01/24/2016   Procedure: SEPTOPLASTY NASAL;  Surgeon: Clyde Canterbury, MD;  Location: [place];  Service: ENT;  Laterality: N/A;  . SPINE  SURGERY     L3 to L5  . TONSILLECTOMY      Prior to Admission medications   Medication Sig Start Date End Date Taking? Authorizing Provider  albuterol (PROAIR HFA) 108 (90 Base) MCG/ACT inhaler Inhale 2 puffs into the lungs every 4 (four) hours as needed for wheezing. 05/08/18 08/31/21  Wilhelmina Mcardle, MD  celecoxib (CELEBREX) 200 MG capsule TAKE 1 CAPSULE(200 MG) BY MOUTH  DAILY 10/23/19   Crecencio Mc, MD  cholecalciferol (VITAMIN D) 400 UNITS TABS tablet Take 400 Units by mouth.    [provider]  fexofenadine (ALLEGRA) 180 MG tablet Take 180 mg by mouth daily.    [provider]  fluticasone furoate-vilanterol (BREO ELLIPTA) 100-25 MCG/INH AEPB Inhale 1 puff into the lungs daily. 06/18/18   Wilhelmina Mcardle, MD  gabapentin (NEURONTIN) 300 MG capsule TAKE ONE CAPSULE BY MOUTH WITH BREAKFAST AND LUNCH, AND 2 CAPSULES EVERY NIGHT AT BEDTIME 10/27/19   Crecencio Mc, MD  hydrochlorothiazide (HYDRODIURIL) 25 MG tablet TAKE 1 TABLET BY MOUTH EVERY DAY *NO REFILLS ON TELMISARTAN AT WAG* 10/20/19   Crecencio Mc, MD  lactobacillus acidophilus (BACID) TABS tablet Take 1 tablet by mouth daily.    [provider]  levothyroxine (SYNTHROID) 137 MCG tablet TAKE 1 TABLET BY MOUTH DAILY BEFORE BREAKFAST 10/08/19   Crecencio Mc, MD  Multiple Vitamins-Minerals (SENIOR MULTIVITAMIN PLUS) TABS Take 1 tablet by mouth.      [provider]  oxymetazoline (AFRIN) 0.05 % nasal spray Place 3 sprays into both nostrils 2 (two) times daily as needed for up to 7 days (nose bleed). 11/09/19 11/16/19  Johnsie Moscoso, Johnette Abraham B, FNP  sertraline (ZOLOFT) 100 MG tablet TAKE 1 TABLET(100 MG) BY MOUTH DAILY 12/29/18   Crecencio Mc, MD  telmisartan (MICARDIS) 40 MG tablet Take 1 tablet (40 mg total) by mouth at bedtime. 08/20/19   Crecencio Mc, MD    Allergies Aspirin, Statins, Codeine, and Latex  Family History  Problem Relation Age of Onset  . Heart disease Mother        CHF and cardiomyopathy  . Thyroid disease Mother   . Hyperlipidemia Mother   . Hyperlipidemia Father   . Cancer Father        liver  . Lupus Sister   . Hyperlipidemia Sister   . Breast cancer Neg Hx     Social History Social History   Tobacco Use  . Smoking status: Former Smoker    Packs/day: 0.50    Years: 25.00    Pack years: 12.50    Types: Cigarettes    Quit date: 09/17/1993      Years since quitting: 26.1  . Smokeless tobacco: Never Used  Substance Use Topics  . Alcohol use: No  . Drug use: No    Review of Systems  Constitutional: No fever/chills Eyes: No visual changes. ENT: Positive for resolved nose bleed. No sore throat. Cardiovascular: Denies chest pain. Respiratory: Denies shortness of breath. Gastrointestinal: No abdominal pain. No nausea, no vomiting. No diarrhea.  No constipation. Genitourinary: Negative for dysuria. Musculoskeletal: Negative for back pain. Skin: Negative for rash. Neurological: Negative for headaches, focal weakness or numbness. ____________________________________________   PHYSICAL EXAM:  VITAL SIGNS: ED Triage Vitals  Enc Vitals Group     BP 11/09/19 1328 (!) 152/85     Pulse Rate 11/09/19 1328 78     Resp 11/09/19 1328 18     Temp 11/09/19 1328 98.9 F (37.2 C)  Temp Source 11/09/19 1328 Oral     SpO2 11/09/19 1328 96 %     Weight 11/09/19 1325 155 lb (70.3 kg)     Height 11/09/19 1325 5\' 4"  (1.626 m)     Head Circumference --      Peak Flow --      Pain Score 11/09/19 1325 0     Pain Loc --      Pain Edu? --      Excl. in [place]? --     Constitutional: Alert and oriented. Well appearing and in no acute distress. Eyes: Conjunctivae are normal. PERRL. EOMI. Head: Atraumatic. Nose: No visible area or source of epistaxis.  Mouth/Throat: Mucous membranes are moist.  Oropharynx non-erythematous. Neck: No stridor.   Hematological/Lymphatic/Immunilogical: No cervical lymphadenopathy. Cardiovascular: Normal rate, regular rhythm. Grossly normal heart sounds.  Good peripheral circulation. Respiratory: Normal respiratory effort.  No retractions. Lungs CTAB. Gastrointestinal: Soft and nontender. No distention. No abdominal bruits. No CVA tenderness. Genitourinary:  Musculoskeletal: No lower extremity tenderness nor edema.  No joint effusions. Neurologic:  Normal speech and language. No gross focal neurologic  deficits are appreciated. No gait instability. Skin:  Skin is warm, dry and intact. No rash noted. Psychiatric: Mood and affect are normal. Speech and behavior are normal.  ____________________________________________   LABS (all labs ordered are listed, but only abnormal results are displayed)  Labs Reviewed  CBC WITH DIFFERENTIAL/PLATELET - Abnormal; Notable for the following components:      Result Value   WBC 11.9 (*)    RBC 5.44 (*)    Hemoglobin 15.4 (*)    HCT 46.3 (*)    Neutro Abs 8.8 (*)    Abs Immature Granulocytes 0.12 (*)    All other components within normal limits  BASIC METABOLIC PANEL - Abnormal; Notable for the following components:   Glucose, Bld 107 (*)    BUN 24 (*)    All other components within normal limits  PROTIME-INR   ____________________________________________  EKG  Not indicated. ____________________________________________  RADIOLOGY  Official radiology report(s): No results found.  ____________________________________________   PROCEDURES  Procedure(s) performed (including Critical Care):  Procedures  ____________________________________________   INITIAL IMPRESSION / ASSESSMENT AND PLAN     74 year old female presenting to the emergency department after acute onset of epistaxis without specific precipitating event that started this morning.  See HPI for further details.  Plan will be to get some baseline labs since it has been almost a year since she has had any labs drawn which will also include PT/INR.  The bleeding stopped just prior to arrival here in the emergency department.  Awaiting lab results will provide time for monitoring as well.  DIFFERENTIAL DIAGNOSIS  Hypertension, anterior epistaxis, posterior epistaxis  ED COURSE  Labs appear to be at the patient's baseline.  No bleeding while here.  She will be advised to get some Afrin nasal spray and use it only if she notices nosebleed again.  She was advised that if  it does start to bleed again to apply pressure to the nose after using the nasal spray.  If bleeding does not stop within about 15 or 20 minutes, she is to return to the emergency department.  Patient states that she is an established patient with one of the ENT specialist and will call to schedule an appointment for follow-up.  She was also advised to monitor her blood pressure and if it is noted to be elevated, she is to see her primary care  provider. ____________________________________________   FINAL CLINICAL IMPRESSION(S) / ED DIAGNOSES  Final diagnoses:  Epistaxis     ED Discharge Orders         Ordered    oxymetazoline (AFRIN) 0.05 % nasal spray  2 times daily PRN     11/09/19 1543           [name] Day was evaluated in Emergency Department on 11/09/2019 for the symptoms described in the history of present illness. She was evaluated in the context of the global COVID-19 pandemic, which necessitated consideration that the patient might be at risk for infection with the SARS-CoV-2 virus that causes COVID-19. Institutional protocols and algorithms that pertain to the evaluation of patients at risk for COVID-19 are in a state of rapid change based on information released by regulatory bodies including the CDC and federal and state organizations. These policies and algorithms were followed during the patient's care in the ED.   Note:  This document was prepared using Dragon voice recognition software and may include unintentional dictation errors.   Victorino Dike, FNP 11/09/19 1719    Duffy Bruce, MD 11/10/19 1429

## 2019-11-09 NOTE — ED Triage Notes (Signed)
Pt reports her nose bled for about 2.5 hours this am. No bleedindg noted at this time. Pt reports does not take any blood thinning medication, denies trauma or recent illness.

## 2019-11-09 NOTE — ED Notes (Signed)
See triage note   Presents with nose bleed  States she developed a nosebleed this am   States she had bleeding for about 2 hours

## 2019-11-14 ENCOUNTER — Ambulatory Visit: Payer: Medicare HMO | Attending: Internal Medicine

## 2019-11-14 DIAGNOSIS — Z23 Encounter for immunization: Secondary | ICD-10-CM

## 2019-11-14 NOTE — Progress Notes (Signed)
   Covid-19 Vaccination Clinic  Name:  [name] Day    MRN: [id number] DOB: 27-Feb-1946  11/14/2019  [name] was observed post Covid-19 immunization for 15 minutes without incidence. She was provided with Vaccine Information Sheet and instruction to access the V-Safe system.   [name] was instructed to call 911 with any severe reactions post vaccine: Marland Kitchen Difficulty breathing  . Swelling of your face and throat  . A fast heartbeat  . A bad rash all over your body  . Dizziness and weakness    Immunizations Administered    Name Date Dose VIS Date Route   Moderna COVID-19 Vaccine 11/14/2019 12:19 PM 0.5 mL 08/18/2019 Intramuscular   Manufacturer: Moderna   Lot: [id number]   [id number]

## 2019-11-17 ENCOUNTER — Other Ambulatory Visit: Payer: Self-pay

## 2019-11-18 ENCOUNTER — Ambulatory Visit (INDEPENDENT_AMBULATORY_CARE_PROVIDER_SITE_OTHER): Payer: Medicare HMO | Admitting: Internal Medicine

## 2019-11-18 ENCOUNTER — Encounter: Payer: Self-pay | Admitting: Internal Medicine

## 2019-11-18 ENCOUNTER — Other Ambulatory Visit: Payer: Self-pay

## 2019-11-18 ENCOUNTER — Ambulatory Visit (INDEPENDENT_AMBULATORY_CARE_PROVIDER_SITE_OTHER): Payer: Medicare HMO

## 2019-11-18 VITALS — BP 152/80 | HR 77 | Temp 98.0°F | Resp 16 | Ht 64.0 in | Wt 164.4 lb

## 2019-11-18 VITALS — Ht 64.0 in | Wt 155.0 lb

## 2019-11-18 DIAGNOSIS — M064 Inflammatory polyarthropathy: Secondary | ICD-10-CM | POA: Diagnosis not present

## 2019-11-18 DIAGNOSIS — R040 Epistaxis: Secondary | ICD-10-CM

## 2019-11-18 DIAGNOSIS — J439 Emphysema, unspecified: Secondary | ICD-10-CM | POA: Diagnosis not present

## 2019-11-18 DIAGNOSIS — E782 Mixed hyperlipidemia: Secondary | ICD-10-CM

## 2019-11-18 DIAGNOSIS — E039 Hypothyroidism, unspecified: Secondary | ICD-10-CM | POA: Diagnosis not present

## 2019-11-18 DIAGNOSIS — K219 Gastro-esophageal reflux disease without esophagitis: Secondary | ICD-10-CM | POA: Diagnosis not present

## 2019-11-18 DIAGNOSIS — I10 Essential (primary) hypertension: Secondary | ICD-10-CM

## 2019-11-18 DIAGNOSIS — R0989 Other specified symptoms and signs involving the circulatory and respiratory systems: Secondary | ICD-10-CM | POA: Diagnosis not present

## 2019-11-18 DIAGNOSIS — E1140 Type 2 diabetes mellitus with diabetic neuropathy, unspecified: Secondary | ICD-10-CM | POA: Diagnosis not present

## 2019-11-18 DIAGNOSIS — Z0000 Encounter for general adult medical examination without abnormal findings: Secondary | ICD-10-CM | POA: Diagnosis not present

## 2019-11-18 NOTE — Patient Instructions (Addendum)
  Ms. Salera , Thank you for taking time to come for your Medicare Wellness Visit. I appreciate your ongoing commitment to your health goals. Please review the following plan we discussed and let me know if I can assist you in the future.   These are the goals we discussed: Goals      Patient Stated   . DIET - INCREASE WATER INTAKE (pt-stated)     Stay hydrated       This is a list of the screening recommended for you and due dates:  Health Maintenance  Topic Date Due  . Complete foot exam   03/10/2015  . Hemoglobin A1C  07/02/2019  . Eye exam for diabetics  11/18/2019*  . Mammogram  11/17/2020*  . Cologuard (Stool DNA test)  12/03/2021  . Tetanus Vaccine  10/06/2024  . Flu Shot  Completed  . DEXA scan (bone density measurement)  Completed  .  Hepatitis C: One time screening is recommended by Center for Disease Control  (CDC) for  adults born from [birth year] through 1964.   Completed  . Pneumonia vaccines  Completed  *Topic was postponed. The date shown is not the original due date.

## 2019-11-18 NOTE — Progress Notes (Signed)
Subjective:  Patient ID: [name] Day, female    DOB: November 21, 1945  Age: 74 y.o. MRN: [id number]  CC: The primary encounter diagnosis was Bilateral carotid bruits. Diagnoses of Essential hypertension, Type 2 diabetes mellitus with diabetic neuropathy, without long-term current use of insulin ([place]), Mixed hyperlipidemia, Acquired hypothyroidism, Pulmonary emphysema, unspecified emphysema type ([place]), and Inflammatory polyarthritis ([place]) were also pertinent to this visit.  HPI [name] Day presents for follow up on diabetes , hypertension and hypothyroidism .     This visit occurred during the SARS-CoV-2 public health emergency.  Safety protocols were in place, including screening questions prior to the visit, additional usage of staff PPE, and extensive cleaning of exam room while observing appropriate contact time as indicated for disinfecting solutions.   Treated in ED Feb 22 for epistaxis lasting 2 hours ,  No source found on exam of nasal passages.  The bleeding was preceded by a headache, per usual (she has had at last 4 in the last year and 2 since January all preceeded by a headache of sudden onset).  The headache is described as dull in quality and located bilaterally over the crown , acc'd by dizziness which is self limited.  She had sinus surgery 3 years ago for recurrent sinusitis and has not had an infection since surgery.  Not using a saline rinse on a regular basis anymore. Has not see Richardson Landry yet.  DM: has been eating poorly since COVID began..  Not checking sugars or exercising on a regular basis.   Has noted some weight ain.  Has not had an eye exam since 2017.  Hypertension: patient has not been checking her blood pressure twice weekly at home.  Readings have been for the most part > 140/80 on multiple occasions.   at rest . Patient is following a reduce salt diet most days and is taking medications as prescribed,  But is taking  celebrex daily    Outpatient Medications  Prior to Visit  Medication Sig Dispense Refill  . albuterol (PROAIR HFA) 108 (90 Base) MCG/ACT inhaler Inhale 2 puffs into the lungs every 4 (four) hours as needed for wheezing. 1 Inhaler 2  . celecoxib (CELEBREX) 200 MG capsule TAKE 1 CAPSULE(200 MG) BY MOUTH DAILY 30 capsule 2  . cholecalciferol (VITAMIN D) 400 UNITS TABS tablet Take 400 Units by mouth.    . fexofenadine (ALLEGRA) 180 MG tablet Take 180 mg by mouth daily.    . fluticasone furoate-vilanterol (BREO ELLIPTA) 100-25 MCG/INH AEPB Inhale 1 puff into the lungs daily. 60 each 5  . gabapentin (NEURONTIN) 300 MG capsule TAKE ONE CAPSULE BY MOUTH WITH BREAKFAST AND LUNCH, AND 2 CAPSULES EVERY NIGHT AT BEDTIME 120 capsule 1  . hydrochlorothiazide (HYDRODIURIL) 25 MG tablet TAKE 1 TABLET BY MOUTH EVERY DAY *NO REFILLS ON TELMISARTAN AT WAG* 90 tablet 1  . lactobacillus acidophilus (BACID) TABS tablet Take 1 tablet by mouth daily.    Marland Kitchen levothyroxine (SYNTHROID) 137 MCG tablet TAKE 1 TABLET BY MOUTH DAILY BEFORE BREAKFAST 90 tablet 1  . Multiple Vitamins-Minerals (SENIOR MULTIVITAMIN PLUS) TABS Take 1 tablet by mouth.      . sertraline (ZOLOFT) 100 MG tablet TAKE 1 TABLET(100 MG) BY MOUTH DAILY 90 tablet 1  . telmisartan (MICARDIS) 40 MG tablet Take 1 tablet (40 mg total) by mouth at bedtime. 90 tablet 1   No facility-administered medications prior to visit.    Review of Systems;  Patient denies headache, fevers, malaise, unintentional weight loss, skin  rash, eye pain, sinus congestion and sinus pain, sore throat, dysphagia,  hemoptysis , cough, dyspnea, wheezing, chest pain, palpitations, orthopnea, edema, abdominal pain, nausea, melena, diarrhea, constipation, flank pain, dysuria, hematuria, urinary  Frequency, nocturia, numbness, tingling, seizures,  Focal weakness, Loss of consciousness,  Tremor, insomnia, depression, anxiety, and suicidal ideation.      Objective:  BP (!) 152/80 (BP Location: Left Arm, Patient Position: Sitting,  Cuff Size: Normal)   Pulse 77   Temp 98 F (36.7 C) (Temporal)   Resp 16   Ht 5\' 4"  (1.626 m)   Wt 164 lb 6.4 oz (74.6 kg)   SpO2 95%   BMI 28.22 kg/m   BP Readings from Last 3 Encounters:  11/18/19 (!) 152/80  11/09/19 (!) 152/85  11/17/18 122/70    Wt Readings from Last 3 Encounters:  11/18/19 164 lb 6.4 oz (74.6 kg)  11/18/19 155 lb (70.3 kg)  11/09/19 155 lb (70.3 kg)    General appearance: alert, cooperative and appears stated age Ears: normal TM's and external ear canals both ears Throat: lips, mucosa, and tongue normal; teeth and gums normal Neck: no adenopathy, bilateral carotid bruits, supple, symmetrical, trachea midline and thyroid not enlarged, symmetric, no tenderness/mass/nodules Back: symmetric, no curvature. ROM normal. No CVA tenderness. Lungs: clear to auscultation bilaterally Heart: regular rate and rhythm, S1, S2 normal, no murmur, click, rub or gallop Abdomen: soft, non-tender; bowel sounds normal; no masses,  no organomegaly Pulses: 2+ and symmetric Skin: Skin color, texture, turgor normal. No rashes or lesions Lymph nodes: Cervical, supraclavicular, and axillary nodes normal. Feet: Feet calloused,  neuropathy present     Lab Results  Component Value Date   HGBA1C 6.0 11/18/2019   HGBA1C 6.1 12/31/2018   HGBA1C 6.0 11/17/2018    Lab Results  Component Value Date   CREATININE 0.87 11/18/2019   CREATININE 0.72 11/09/2019   CREATININE 0.75 12/31/2018    Lab Results  Component Value Date   WBC 11.9 (H) 11/09/2019   HGB 15.4 (H) 11/09/2019   HCT 46.3 (H) 11/09/2019   PLT 275 11/09/2019   GLUCOSE 113 (H) 11/18/2019   CHOL 264 (H) 11/18/2019   TRIG 363.0 (H) 11/18/2019   HDL 42.50 11/18/2019   LDLDIRECT 179.0 11/18/2019   LDLCALC 81 03/08/2014   ALT 31 11/18/2019   AST 27 11/18/2019   NA 140 11/18/2019   K 4.3 11/18/2019   CL 102 11/18/2019   CREATININE 0.87 11/18/2019   BUN 23 11/18/2019   CO2 28 11/18/2019   TSH 2.01 11/18/2019    INR 0.9 11/09/2019   HGBA1C 6.0 11/18/2019   MICROALBUR 0.7 04/28/2018    No results found.  Assessment & Plan:   Problem List Items Addressed This Visit      Unprioritized   COPD (chronic obstructive pulmonary disease) ([place])    Secondary to tobacco abuse in setting of childhood related asthma. Mild by PFTs  . Symptoms have resolved with  Regular  use of Breo.  She has had the appropriate vaccinations       Mixed hyperlipidemia    With aortic atherosclerosis noted on plain chest film.  Direct LDL today is 179.   She has type 2 DM , aortic atherosclerosis on chest films,  And bilateral bruits on today's exam.     Will discuss therapy with Repatha at follow up given history of statin intolerance.     Lab Results  Component Value Date   CHOL 264 (H) 11/18/2019   HDL  42.50 11/18/2019   LDLCALC 81 03/08/2014   LDLDIRECT 179.0 11/18/2019   TRIG 363.0 (H) 11/18/2019   CHOLHDL 6 11/18/2019         Hypertension   Diabetes mellitus with neuropathy ([place])    Remains  well-controlled on diet alone, despite being lost to follow up.   hemoglobin A1c which has been consistently at or  less than 7.0 . Patient is up-to-date on eye exams and foot exam was notable for  mild neuropathy and callouses  Patient  has no history of microalbuminuria. Patient has avoided  statin therapy for CAD risk reduction but has tolerated  ACE/ARB for renal protection and hypertension .  Aspirin is C/I due to history of GI bleed;  Will recommend trial of a PCSK9 inhibitor.   Lab Results  Component Value Date   HGBA1C 6.0 11/18/2019   Lab Results  Component Value Date   MICROALBUR 0.7 04/28/2018         Relevant Orders   Microalbumin / creatinine urine ratio   Inflammatory polyarthritis ([facility])   Hypothyroidism   Bilateral carotid bruits - Primary    Carotid dopplers ordered.  Will recommend PCSK9 inhibitor to lower LDL to 70 iven evidence of atherosclerosis on plain films       Relevant Orders   US  Carotid Duplex Bilateral     A total of 40 minutes was spent with patient more than half of which was spent in counseling patient on the above mentioned issues , reviewing and explaining recent labs and imaging studies done, and coordination of care. I am having Ardis Hughs maintain her Senior Multivitamin Plus, fexofenadine, cholecalciferol, lactobacillus acidophilus, albuterol, fluticasone furoate-vilanterol, sertraline, telmisartan, levothyroxine, hydrochlorothiazide, celecoxib, and gabapentin.  No orders of the defined types were placed in this encounter.   There are no discontinued medications.  Follow-up: No follow-ups on file.   Crecencio Mc, MD

## 2019-11-18 NOTE — Progress Notes (Addendum)
Subjective:   [name] Day is a 74 y.o. female who presents for Medicare Annual (Subsequent) preventive examination.  Review of Systems:  No ROS.  Medicare Wellness Virtual Visit.  Visual/audio telehealth visit, UTA vital signs.  Ht/Wt provided.   See social history for additional risk factors.   Cardiac Risk Factors include: advanced age (>65men, >75 women);diabetes mellitus;hypertension     Objective:     Vitals: Ht 5\' 4"  (1.626 m)   Wt 155 lb (70.3 kg)   BMI 26.61 kg/m   Body mass index is 26.61 kg/m.  Advanced Directives 11/18/2019 11/09/2019 11/17/2018 04/24/2017 04/24/2016 01/24/2016 02/03/2015  Does Patient Have a Medical Advance Directive? Yes No Yes Yes Yes Yes Yes  Type of [employer] [place];Living will [place];Living will [place];Living will [place];Living will Living will  Does patient want to make changes to medical advance directive? No - Patient declined - No - Patient declined No - Patient declined - - No - Patient declined  Copy of [place] in Chart? No - copy requested - No - copy requested No - copy requested No - copy requested Yes No - copy requested  Would patient like information on creating a medical advance directive? - No - Patient declined - - - - -    Tobacco Social History   Tobacco Use  Smoking Status Former Smoker  . Packs/day: 0.50  . Years: 25.00  . Pack years: 12.50  . Types: Cigarettes  . Quit date: 09/17/1993  . Years since quitting: 26.1  Smokeless Tobacco Never Used     Counseling given: Not Answered   Clinical Intake:  Pre-visit preparation completed: Yes        Diabetes: Yes  How often do you need to have someone help you when you read instructions, pamphlets, or other written materials from your doctor or pharmacy?: 1 - Never  Interpreter Needed?: No     Past Medical History:    Diagnosis Date  . allergic rhinitis   . Arthritis    hands  . Cancer ([place])    skin  . COPD (chronic obstructive pulmonary disease) ([place])    PFTS 2013 FEV1 nearly normalized  . Depression   . Diabetes mellitus    diet controlled  . GERD (gastroesophageal reflux disease)   . Headache    sinus  . Hyperlipidemia   . Hypertension   . Hypothyroid 2002  . Lichen sclerosus et atrophicus of the vulva 01/2011  . Neuropathy    diabetic  . Raynauds disease   . Rheumatic fever    HX   Past Surgical History:  Procedure Laterality Date  . ABDOMINAL HYSTERECTOMY  1978  . BACK SURGERY  2000   repair cord damage after 1st back surgery  . COLONOSCOPY  2004  . ETHMOIDECTOMY Bilateral 01/24/2016   Procedure: TOTAL ETHMOIDECTOMY BILATERAL;  Surgeon: Clyde Canterbury, MD;  Location: [facility];  Service: ENT;  Laterality: Bilateral;  . FRONTAL SINUS EXPLORATION Bilateral 01/24/2016   Procedure: BILATERAL FRONTAL SINUSOTOMY;  Surgeon: Clyde Canterbury, MD;  Location: [facility];  Service: ENT;  Laterality: Bilateral;  Latex sensitivity  . IMAGE GUIDED SINUS SURGERY N/A 01/24/2016   Procedure: IMAGE GUIDED SINUS SURGERY;  Surgeon: Clyde Canterbury, MD;  Location: [facility];  Service: ENT;  Laterality: N/A;  GAVE DISK TO [facility] 3/27  . MAXILLARY ANTROSTOMY Bilateral 01/24/2016   Procedure:  MAXILLARY ANTROSTOMY BILATERAL ENDOSCOPICWITH TISSUE REMOVAL;  Surgeon: Clyde Canterbury, MD;  Location: [facility];  Service: ENT;  Laterality: Bilateral;  . SEPTOPLASTY N/A 01/24/2016   Procedure: SEPTOPLASTY NASAL;  Surgeon: Clyde Canterbury, MD;  Location: [facility];  Service: ENT;  Laterality: N/A;  . SPINE SURGERY     L3 to L5  . TONSILLECTOMY     Family History  Problem Relation Age of Onset  . Heart disease Mother        CHF and cardiomyopathy  . Thyroid disease Mother   . Hyperlipidemia Mother   . Hyperlipidemia Father   . Cancer Father        liver  . Lupus Sister   .  Hyperlipidemia Sister   . Breast cancer Neg Hx    Social History   Socioeconomic History  . Marital status: Widowed    Spouse name: Not on file  . Number of children: Not on file  . Years of education: Not on file  . Highest education level: Not on file  Occupational History  . 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 Smoking status: Former Smoker    Packs/day: 0.50    Years: 25.00    Pack years: 12.50    Types: Cigarettes    Quit date: 09/17/1993    Years since quitting: 26.1  . Smokeless tobacco: Never Used  Substance and Sexual Activity  . Alcohol use: No  . Drug use: No  . Sexual activity: Not Currently  Other Topics Concern  . Not on file  Social History Narrative  . Not on file   Social Determinants of Health   Financial Resource Strain:   . Difficulty of Paying Living Expenses: Not on file  Food Insecurity:   . Worried About Charity fundraiser in the Last Year: Not on file  . Ran Out of Food in the Last Year: Not on file  Transportation Needs:   . Lack of Transportation (Medical): Not on file  . Lack of Transportation (Non-Medical): Not on file  Physical Activity:   . Days of Exercise per Week: Not on file  . Minutes of Exercise per Session: Not on file  Stress: No Stress Concern Present  . Feeling of Stress : Only a little  Social Connections:   . Frequency of Communication with Friends and Family: Not on file  . Frequency of Social Gatherings with Friends and Family: Not on file  . Attends Religious Services: Not on file  . Active Member of Clubs or Organizations: Not on file  . Attends Archivist Meetings: Not on file  . Marital Status: Not on file    Outpatient Encounter Medications as of 11/18/2019  Medication Sig  . albuterol (PROAIR HFA) 108 (90 Base) MCG/ACT inhaler Inhale 2 puffs into the lungs every 4 (four) hours as needed for wheezing.  . celecoxib (CELEBREX) 200 MG capsule TAKE 1 CAPSULE(200 MG)  BY MOUTH DAILY  . cholecalciferol (VITAMIN D) 400 UNITS TABS tablet Take 400 Units by mouth.  . fexofenadine (ALLEGRA) 180 MG tablet Take 180 mg by mouth daily.  . fluticasone furoate-vilanterol (BREO ELLIPTA) 100-25 MCG/INH AEPB Inhale 1 puff into the lungs daily.  Marland Kitchen gabapentin (NEURONTIN) 300 MG capsule TAKE ONE CAPSULE BY MOUTH WITH BREAKFAST AND LUNCH, AND 2 CAPSULES EVERY NIGHT AT BEDTIME  . hydrochlorothiazide (HYDRODIURIL) 25 MG tablet TAKE 1 TABLET BY MOUTH EVERY DAY *NO REFILLS ON TELMISARTAN AT WAG*  . lactobacillus  acidophilus (BACID) TABS tablet Take 1 tablet by mouth daily.  Marland Kitchen levothyroxine (SYNTHROID) 137 MCG tablet TAKE 1 TABLET BY MOUTH DAILY BEFORE BREAKFAST  . Multiple Vitamins-Minerals (SENIOR MULTIVITAMIN PLUS) TABS Take 1 tablet by mouth.    . sertraline (ZOLOFT) 100 MG tablet TAKE 1 TABLET(100 MG) BY MOUTH DAILY  . telmisartan (MICARDIS) 40 MG tablet Take 1 tablet (40 mg total) by mouth at bedtime.   No facility-administered encounter medications on file as of 11/18/2019.    Activities of Daily Living In your present state of health, do you have any difficulty performing the following activities: 11/18/2019  Hearing? N  Vision? N  Difficulty concentrating or making decisions? N  Walking or climbing stairs? N  Dressing or bathing? N  Doing errands, shopping? N  Preparing Food and eating ? N  Using the Toilet? N  In the past six months, have you accidently leaked urine? N  Do you have problems with loss of bowel control? N  Managing your Medications? N  Managing your Finances? N  Housekeeping or managing your Housekeeping? N  Some recent data might be hidden    Patient Care Team: Crecencio Mc, MD as PCP - General (Internal Medicine)    Assessment:   This is a routine wellness examination for Denette.  Nurse connected with patient 11/18/19 at  9:30 AM EST by a telephone enabled telemedicine application and verified that I am speaking with the correct person  using two identifiers. Patient stated full name and DOB. Patient gave permission to continue with virtual visit. Patient's location was at home and Nurse's location was at [place] office.   Patient is alert and oriented x3. Patient denies difficulty focusing or concentrating. Patient likes to complete crossword puzzles for brain stimulation.   Health Maintenance Due: -Mammogram- plans to schedule later in the season. -Eye Exam- plans to schedule later in the season. -Foot Exam- followed by pcp. Denies wounds or any new changes.  -Hgb A1c- 12/31/18 (6.1) See completed HM at the end of note.   Eye: Visual acuity not assessed. Virtual visit. Followed by their ophthalmologist.  Retinopathy- none reported.  Dental: Plans to schedule  Hearing: Demonstrates normal hearing during visit.  Safety:  Patient feels safe at home- yes Patient does have smoke detectors at home- yes Patient does wear sunscreen or protective clothing when in direct sunlight - yes Patient does wear seat belt when in a moving vehicle - yes Patient drives- yes Adequate lighting in walkways free from debris- yes Grab bars and handrails used as appropriate- yes Ambulates with an assistive device- yes; cane as needed. Cell phone on person when ambulating outside of the home- yes  Social: Alcohol intake - no     Smoking history- former   Smokers in home? none Illicit drug use? none  Medication: Taking as directed and without issues.  Self managed - yes   Covid-19: Precautions and sickness symptoms discussed. Wears mask, social distancing, hand hygiene as appropriate.   Activities of Daily Living Patient denies needing assistance with: household chores, feeding themselves, getting from bed to chair, getting to the toilet, bathing/showering, dressing, managing money, or preparing meals.   Discussed the importance of a healthy diet, water intake and the benefits of aerobic exercise.   Physical activity-  walking  Diet:  Regular Water: 24 ounces daily  Other Providers Patient Care Team: Crecencio Mc, MD as PCP - General (Internal Medicine) Exercise Activities and Dietary recommendations Current Exercise Habits: Home exercise routine,  Type of exercise: walking  Goals      Patient Stated   . DIET - INCREASE WATER INTAKE (pt-stated)     Stay hydrated       Fall Risk Fall Risk  11/18/2019 01/27/2019 11/17/2018 07/01/2017 04/24/2017  Falls in the past year? 0 0 1 No No  Number falls in past yr: - 0 0 - -  Injury with Fall? - 0 1 - -  Comment - - Followed by pcp - -  Follow up Falls evaluation completed Falls evaluation completed - - -   Timed Get Up and Go performed: no, virtual visit  Depression Screen PHQ 2/9 Scores 11/18/2019 01/27/2019 11/17/2018 07/01/2017  PHQ - 2 Score 0 0 0 0  PHQ- 9 Score - 1 - -     Cognitive Function MMSE - Mini Mental State Exam 04/24/2017 04/24/2016  Orientation to time 5 5  Orientation to Place 5 5  Registration 3 3  Attention/ Calculation 5 5  Recall 3 3  Language- name 2 objects 2 2  Language- repeat 1 1  Language- follow 3 step command 3 3  Language- read & follow direction 1 1  Write a sentence 1 1  Copy design 1 1  Total score 30 30     6CIT Screen 11/18/2019 11/17/2018  What Year? 0 points 0 points  What month? 0 points 0 points  What time? 0 points 0 points  Count back from 20 0 points 0 points  Months in reverse 0 points 0 points  Repeat phrase 0 points 0 points  Total Score 0 0    Immunization History  Administered Date(s) Administered  . Influenza Split 07/11/2014  . Influenza Whole 05/29/2008, 05/14/2011, 07/11/2012  . Influenza, High Dose Seasonal PF 10/24/2015, 06/18/2018, 06/02/2019  . Influenza,inj,Quad PF,6+ Mos 06/16/2013  . Influenza-Unspecified 05/18/2014, 07/26/2017, 06/01/2019  . Moderna SARS-COVID-2 Vaccination 11/14/2019  . Pneumococcal Conjugate-13 11/05/2013  . Pneumococcal Polysaccharide-23 08/28/2010,  10/24/2015  . Td 09/24/2003, 10/06/2014   Screening Tests Health Maintenance  Topic Date Due  . FOOT EXAM  03/10/2015  . HEMOGLOBIN A1C  07/02/2019  . OPHTHALMOLOGY EXAM  11/18/2019 (Originally 08/01/2017)  . MAMMOGRAM  11/17/2020 (Originally 10/31/2019)  . Fecal DNA (Cologuard)  12/03/2021  . TETANUS/TDAP  10/06/2024  . INFLUENZA VACCINE  Completed  . DEXA SCAN  Completed  . Hepatitis C Screening  Completed  . PNA vac Low Risk Adult  Completed      Plan:   Keep all routine maintenance appointments.   Follow up with your doctor today at 2:30  Medicare Attestation I have personally reviewed: The patient's medical and social history Their use of alcohol, tobacco or illicit drugs Their current medications and supplements The patient's functional ability including ADLs,fall risks, home safety risks, cognitive, and hearing and visual impairment Diet and physical activities Evidence for depression   I have reviewed and discussed with patient certain preventive protocols, quality metrics, and best practice recommendations.      OBrien-Blaney, Kiarra Kidd L, LPN  [phone]    I have reviewed the above information and agree with above.   Deborra Medina, MD

## 2019-11-18 NOTE — Patient Instructions (Addendum)
Suspend celebrex while you are  checking blood pressure and use tylenol instead 2000 mg daily in divided doses  You can use omeprazole, famotidine,  Or generic nexium for your GERD   Referral to Dr Virgia Land,  [facility] )( for carotid ultrasounds),  And podiatry   Return in 3 months for follow up on all issues    Gastroesophageal Reflux Disease, Adult Gastroesophageal reflux (GER) happens when acid from the stomach flows up into the tube that connects the mouth and the stomach (esophagus). Normally, food travels down the esophagus and stays in the stomach to be digested. With GER, food and stomach acid sometimes move back up into the esophagus. You may have a disease called gastroesophageal reflux disease (GERD) if the reflux:  Happens often.  Causes frequent or very bad symptoms.  Causes problems such as damage to the esophagus. When this happens, the esophagus becomes sore and swollen (inflamed). Over time, GERD can make small holes (ulcers) in the lining of the esophagus. What are the causes? This condition is caused by a problem with the muscle between the esophagus and the stomach. When this muscle is weak or not normal, it does not close properly to keep food and acid from coming back up from the stomach. The muscle can be weak because of:  Tobacco use.  Pregnancy.  Having a certain type of hernia (hiatal hernia).  Alcohol use.  Certain foods and drinks, such as coffee, chocolate, onions, and peppermint. What increases the risk? You are more likely to develop this condition if you:  Are overweight.  Have a disease that affects your connective tissue.  Use NSAID medicines. What are the signs or symptoms? Symptoms of this condition include:  Heartburn.  Difficult or painful swallowing.  The feeling of having a lump in the throat.  A bitter taste in the mouth.  Bad breath.  Having a lot of saliva.  Having an upset or bloated  stomach.  Belching.  Chest pain. Different conditions can cause chest pain. Make sure you see your doctor if you have chest pain.  Shortness of breath or noisy breathing (wheezing).  Ongoing (chronic) cough or a cough at night.  Wearing away of the surface of teeth (tooth enamel).  Weight loss. How is this treated? Treatment will depend on how bad your symptoms are. Your doctor may suggest:  Changes to your diet.  Medicine.  Surgery. Follow these instructions at home: Eating and drinking   Follow a diet as told by your doctor. You may need to avoid foods and drinks such as: ? Coffee and tea (with or without caffeine). ? Drinks that contain alcohol. ? Energy drinks and sports drinks. ? Bubbly (carbonated) drinks or sodas. ? Chocolate and cocoa. ? Peppermint and mint flavorings. ? Garlic and onions. ? Horseradish. ? Spicy and acidic foods. These include peppers, chili powder, curry powder, vinegar, hot sauces, and BBQ sauce. ? Citrus fruit juices and citrus fruits, such as oranges, lemons, and limes. ? Tomato-based foods. These include red sauce, chili, salsa, and pizza with red sauce. ? Fried and fatty foods. These include donuts, french fries, potato chips, and high-fat dressings. ? High-fat meats. These include hot dogs, rib eye steak, sausage, ham, and bacon. ? High-fat dairy items, such as whole milk, butter, and cream cheese.  Eat small meals often. Avoid eating large meals.  Avoid drinking large amounts of liquid with your meals.  Avoid eating meals during the 2-3 hours before bedtime.  Avoid  lying down right after you eat.  Do not exercise right after you eat. Lifestyle   Do not use any products that contain nicotine or tobacco. These include cigarettes, e-cigarettes, and chewing tobacco. If you need help quitting, ask your doctor.  Try to lower your stress. If you need help doing this, ask your doctor.  If you are overweight, lose an amount of weight  that is healthy for you. Ask your doctor about a safe weight loss goal. General instructions  Pay attention to any changes in your symptoms.  Take over-the-counter and prescription medicines only as told by your doctor. Do not take aspirin, ibuprofen, or other NSAIDs unless your doctor says it is okay.  Wear loose clothes. Do not wear anything tight around your waist.  Raise (elevate) the head of your bed about 6 inches (15 cm).  Avoid bending over if this makes your symptoms worse.  Keep all follow-up visits as told by your doctor. This is important. Contact a doctor if:  You have new symptoms.  You lose weight and you do not know why.  You have trouble swallowing or it hurts to swallow.  You have wheezing or a cough that keeps happening.  Your symptoms do not get better with treatment.  You have a hoarse voice. Get help right away if:  You have pain in your arms, neck, jaw, teeth, or back.  You feel sweaty, dizzy, or light-headed.  You have chest pain or shortness of breath.  You throw up (vomit) and your throw-up looks like blood or coffee grounds.  You pass out (faint).  Your poop (stool) is bloody or black.  You cannot swallow, drink, or eat. Summary  If a person has gastroesophageal reflux disease (GERD), food and stomach acid move back up into the esophagus and cause symptoms or problems such as damage to the esophagus.  Treatment will depend on how bad your symptoms are.  Follow a diet as told by your doctor.  Take all medicines only as told by your doctor. This information is not intended to replace advice given to you by your health care provider. Make sure you discuss any questions you have with your health care provider. Document Revised: 03/12/2018 Document Reviewed: 03/12/2018 Elsevier Patient Education  [place].

## 2019-11-19 LAB — COMPREHENSIVE METABOLIC PANEL
ALT: 31 U/L (ref 0–35)
AST: 27 U/L (ref 0–37)
Albumin: 4 g/dL (ref 3.5–5.2)
Alkaline Phosphatase: 85 U/L (ref 39–117)
BUN: 23 mg/dL (ref 6–23)
CO2: 28 mEq/L (ref 19–32)
Calcium: 10.2 mg/dL (ref 8.4–10.5)
Chloride: 102 mEq/L (ref 96–112)
Creatinine, Ser: 0.87 mg/dL (ref 0.40–1.20)
GFR: 63.84 mL/min (ref >60.00)
Glucose, Bld: 113 mg/dL — ABNORMAL HIGH (ref 70–99)
Potassium: 4.3 mEq/L (ref 3.5–5.1)
Sodium: 140 mEq/L (ref 135–145)
Total Bilirubin: 0.4 mg/dL (ref 0.2–1.2)
Total Protein: 7.2 g/dL (ref 6.0–8.3)

## 2019-11-19 LAB — LDL CHOLESTEROL, DIRECT: Direct LDL: 179 mg/dL

## 2019-11-19 LAB — LIPID PANEL
Cholesterol: 264 mg/dL — ABNORMAL HIGH (ref 0–200)
HDL: 42.5 mg/dL (ref >39.00)
NonHDL: 221.21
Total CHOL/HDL Ratio: 6
Triglycerides: 363 mg/dL — ABNORMAL HIGH (ref 0.0–149.0)
VLDL: 72.6 mg/dL — ABNORMAL HIGH (ref 0.0–40.0)

## 2019-11-19 LAB — HEMOGLOBIN A1C: Hgb A1c MFr Bld: 6 % (ref 4.6–6.5)

## 2019-11-19 LAB — TSH: TSH: 2.01 u[IU]/mL (ref 0.35–4.50)

## 2019-11-20 ENCOUNTER — Encounter: Payer: Self-pay | Admitting: Internal Medicine

## 2019-11-20 DIAGNOSIS — I6523 Occlusion and stenosis of bilateral carotid arteries: Secondary | ICD-10-CM | POA: Insufficient documentation

## 2019-11-20 DIAGNOSIS — R0989 Other specified symptoms and signs involving the circulatory and respiratory systems: Secondary | ICD-10-CM | POA: Insufficient documentation

## 2019-11-20 DIAGNOSIS — R040 Epistaxis: Secondary | ICD-10-CM | POA: Insufficient documentation

## 2019-11-20 NOTE — Assessment & Plan Note (Signed)
She denies abdominal pain but has reflux symptoms.  Trial of OTC prilosec, nexium or famotidine  advised

## 2019-11-20 NOTE — Assessment & Plan Note (Signed)
With aortic atherosclerosis noted on plain chest film.  Direct LDL today is 179.   She has type 2 DM , aortic atherosclerosis on chest films,  And bilateral bruits on today's exam.     Will discuss therapy with Repatha at follow up given history of statin intolerance.     Lab Results  Component Value Date   CHOL 264 (H) 11/18/2019   HDL 42.50 11/18/2019   LDLCALC 81 03/08/2014   LDLDIRECT 179.0 11/18/2019   TRIG 363.0 (H) 11/18/2019   CHOLHDL 6 11/18/2019

## 2019-11-20 NOTE — Assessment & Plan Note (Signed)
Not at goal  on telmisartan and hctz.  .  Will stop celebrex and reassess.    Lab Results  Component Value Date   CREATININE 0.87 11/18/2019   Lab Results  Component Value Date   NA 140 11/18/2019   K 4.3 11/18/2019   CL 102 11/18/2019   CO2 28 11/18/2019

## 2019-11-20 NOTE — Assessment & Plan Note (Signed)
Secondary to tobacco abuse in setting of childhood related asthma. Mild by PFTs  . Symptoms have resolved with  Regular  use of Breo.  She has had the appropriate vaccinations

## 2019-11-20 NOTE — Assessment & Plan Note (Signed)
Carotid dopplers ordered.  Will recommend PCSK9 inhibitor to lower LDL to 70 iven evidence of atherosclerosis on plain films

## 2019-11-20 NOTE — Assessment & Plan Note (Addendum)
Remains  well-controlled on diet alone, despite being lost to follow up.   hemoglobin A1c which has been consistently at or  less than 7.0 . Patient is up-to-date on eye exams and foot exam was notable for  mild neuropathy and callouses .  Patient  has no history of microalbuminuria. Patient has avoided  statin therapy for CAD risk reduction but has tolerated  ACE/ARB for renal protection and hypertension .  Aspirin is C/I due to history of GI bleed;  Will recommend trial of a PCSK9 inhibitor. Referral to podiatry recommended for management of callouses given her historry of foot  ulcer     Lab Results  Component Value Date   HGBA1C 6.0 11/18/2019   Lab Results  Component Value Date   MICROALBUR 0.7 04/28/2018

## 2019-11-20 NOTE — Assessment & Plan Note (Signed)
No source find during ER visit.  Refer to [employer] [facility] for evaluation

## 2019-12-03 ENCOUNTER — Ambulatory Visit: Payer: Self-pay | Admitting: Podiatry

## 2019-12-12 ENCOUNTER — Ambulatory Visit: Payer: Medicare HMO | Attending: Internal Medicine

## 2019-12-12 DIAGNOSIS — Z23 Encounter for immunization: Secondary | ICD-10-CM

## 2019-12-12 NOTE — Progress Notes (Signed)
   Covid-19 Vaccination Clinic  Name:  [name] Day    MRN: [id number] DOB: Nov 23, 1945  12/12/2019  [name] was observed post Covid-19 immunization for 15 minutes without incident. She was provided with Vaccine Information Sheet and instruction to access the V-Safe system.   [name] was instructed to call 911 with any severe reactions post vaccine: Marland Kitchen Difficulty breathing  . Swelling of face and throat  . A fast heartbeat  . A bad rash all over body  . Dizziness and weakness   Immunizations Administered    Name Date Dose VIS Date Route   Moderna COVID-19 Vaccine 12/12/2019  9:13 AM 0.5 mL 08/18/2019 Intramuscular   Manufacturer: Moderna   Lot: [id number]   [place]:[id number]

## 2019-12-14 ENCOUNTER — Other Ambulatory Visit: Payer: Self-pay

## 2019-12-14 ENCOUNTER — Encounter: Payer: Self-pay | Admitting: Podiatry

## 2019-12-14 ENCOUNTER — Ambulatory Visit: Payer: Medicare HMO | Admitting: Podiatry

## 2019-12-14 DIAGNOSIS — L608 Other nail disorders: Secondary | ICD-10-CM | POA: Insufficient documentation

## 2019-12-14 DIAGNOSIS — L84 Corns and callosities: Secondary | ICD-10-CM | POA: Diagnosis not present

## 2019-12-14 DIAGNOSIS — E1140 Type 2 diabetes mellitus with diabetic neuropathy, unspecified: Secondary | ICD-10-CM | POA: Diagnosis not present

## 2019-12-14 DIAGNOSIS — M2010 Hallux valgus (acquired), unspecified foot: Secondary | ICD-10-CM | POA: Insufficient documentation

## 2019-12-14 NOTE — Progress Notes (Signed)
This patient presents  to my office for at risk foot care.  This patient requires this care by a professional since this patient will be at risk due to having diabetes with neuropathy.  Patient is taking gabapentin.  This patient is unable to cut nails herself since the patient cannot reach her nails.These nails are painful walking and wearing shoes.   Patient also has multiple callus both feet.  She was told to come to the office by Dr.  Derrel Nip for foot evaluation. This patient presents for at risk foot care today.  General Appearance  Alert, conversant and in no acute stress.  Vascular  Dorsalis pedis and posterior tibial  pulses are palpable  bilaterally.  Capillary return is within normal limits  bilaterally. Temperature is within normal limits  bilaterally.  Neurologic  Senn-Weinstein monofilament wire test diminished   bilaterally. Muscle power within normal limits bilaterally.  Nails Thick disfigured discolored nails with subungual debris  Hallux  B/L. Marland Kitchen No evidence of bacterial infection or drainage bilaterally.  Orthopedic  No limitations of motion  feet .  No crepitus or effusions noted.  No bony pathology or digital deformities noted.  HAV  B/L.  Skin  normotropic skin with no porokeratosis noted bilaterally.  No signs of infections or ulcers noted.     Onychomycosis  Pain in right toes  Pain in left toes.  Callus secondary to HAV  B/L.  Consent was obtained for treatment procedures.   Mechanical debridement of nails 1-5  bilaterally performed with a nail nipper.  Filed with dremel without incident. Debride callus both feet with # 15 blade.   Return office visit    3 months                  Told patient to return for periodic foot care and evaluation due to potential at risk complications.  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  Patient qualifies for diabettic shoes.   Gardiner Barefoot DPM

## 2019-12-16 ENCOUNTER — Other Ambulatory Visit: Payer: Self-pay

## 2019-12-21 ENCOUNTER — Ambulatory Visit (INDEPENDENT_AMBULATORY_CARE_PROVIDER_SITE_OTHER): Payer: Medicare HMO | Admitting: Internal Medicine

## 2019-12-21 ENCOUNTER — Other Ambulatory Visit: Payer: Self-pay

## 2019-12-21 ENCOUNTER — Encounter: Payer: Self-pay | Admitting: Internal Medicine

## 2019-12-21 VITALS — BP 112/76 | HR 68 | Temp 97.7°F | Ht 64.0 in | Wt 165.2 lb

## 2019-12-21 DIAGNOSIS — J439 Emphysema, unspecified: Secondary | ICD-10-CM | POA: Diagnosis not present

## 2019-12-21 DIAGNOSIS — I10 Essential (primary) hypertension: Secondary | ICD-10-CM

## 2019-12-21 DIAGNOSIS — E782 Mixed hyperlipidemia: Secondary | ICD-10-CM

## 2019-12-21 DIAGNOSIS — E1140 Type 2 diabetes mellitus with diabetic neuropathy, unspecified: Secondary | ICD-10-CM | POA: Diagnosis not present

## 2019-12-21 DIAGNOSIS — M064 Inflammatory polyarthropathy: Secondary | ICD-10-CM | POA: Diagnosis not present

## 2019-12-21 DIAGNOSIS — E039 Hypothyroidism, unspecified: Secondary | ICD-10-CM

## 2019-12-21 MED ORDER — EZETIMIBE 10 MG PO TABS: 10.0000 mg | ORAL_TABLET | Freq: Every day | ORAL | 6 refills | Status: DC

## 2019-12-21 MED ORDER — ALBUTEROL SULFATE HFA 108 (90 BASE) MCG/ACT IN AERS: 2.0000 | INHALATION_SPRAY | RESPIRATORY_TRACT | 5 refills | Status: AC | PRN

## 2019-12-21 NOTE — Assessment & Plan Note (Signed)
Up to date on foot exam. overdue for eye exam

## 2019-12-21 NOTE — Assessment & Plan Note (Signed)
Well controlled on current regimen. Renal function stable, no changes today. 

## 2019-12-21 NOTE — Patient Instructions (Signed)
Your cholesterol is elevated. So we are going to try Zetia.   It works by inhibiting the absorption of cholesterol by the small intestine, so it lowers LDL .  We will repeat your labs on or after June 3  Referral to rheumatology in progress,  If you don't hear from [place] in a week let me know

## 2019-12-21 NOTE — Assessment & Plan Note (Addendum)
Episodes have been frequent.  Referral to Rheumatology

## 2019-12-21 NOTE — Assessment & Plan Note (Signed)
Has had statin myalgias on multiple trials.  Willing to try Zetia and/or Repatha

## 2019-12-21 NOTE — Assessment & Plan Note (Addendum)
Exam is normal today,  But she used her albuterol MDI this morning for chest tightness  . Refilling albuterol MDI continue allegra,  May need to add flonase for allergic rhinit

## 2019-12-21 NOTE — Progress Notes (Signed)
Subjective:  Patient ID: [name] Day, female    DOB: 1945/10/29  Age: 74 y.o. MRN: [id number]  CC: The primary encounter diagnosis was Inflammatory polyarthritis ([place]). Diagnoses of Type 2 diabetes mellitus with diabetic neuropathy, without long-term current use of insulin ([place]), Acquired hypothyroidism, Mixed hyperlipidemia, Pulmonary emphysema, unspecified emphysema type ([place]), and Essential hypertension were also pertinent to this visit.  HPI [name] Day presents for follow up in polyarthritis,  Type 2 DM, hypertension and hyperlipidemia   This visit occurred during the SARS-CoV-2 public health emergency.  Safety protocols were in place, including screening questions prior to the visit, additional usage of staff PPE, and extensive cleaning of exam room while observing appropriate contact time as indicated for disinfecting solutions.    Patient has received both doses of the [place] 19 vaccine without complications.  Patient continues to mask when outside of the home except when walking in yard or at safe distances from others .  Patient denies any change in mood or development of unhealthy behaviors resuting from the pandemic's restriction of activities and socialization.    Joints not flaring currently  But having frequent episodes of synovitis involving elbows and hands..  Feels bad during episodes.  Used to take plaqenil .. symptoms cause her to refrain from activities    BP at home  Same as here,  Tolerating meds  HAS NOT RECEIVED APPT FOR CAROTID ULTRASOUND ORDERED ONE MONTH AGO.  Worried about "a ticking time bomb"   wlling to try zetia or repatha due to statin myalgias     Outpatient Medications Prior to Visit  Medication Sig Dispense Refill  . celecoxib (CELEBREX) 200 MG capsule TAKE 1 CAPSULE(200 MG) BY MOUTH DAILY 30 capsule 2  . cholecalciferol (VITAMIN D) 400 UNITS TABS tablet Take 400 Units by mouth.    . fexofenadine (ALLEGRA) 180 MG tablet Take 180 mg  by mouth daily.    Marland Kitchen gabapentin (NEURONTIN) 300 MG capsule TAKE ONE CAPSULE BY MOUTH WITH BREAKFAST AND LUNCH, AND 2 CAPSULES EVERY NIGHT AT BEDTIME 120 capsule 1  . hydrochlorothiazide (HYDRODIURIL) 25 MG tablet TAKE 1 TABLET BY MOUTH EVERY DAY *NO REFILLS ON TELMISARTAN AT WAG* 90 tablet 1  . lactobacillus acidophilus (BACID) TABS tablet Take 1 tablet by mouth daily.    Marland Kitchen levothyroxine (SYNTHROID) 137 MCG tablet TAKE 1 TABLET BY MOUTH DAILY BEFORE BREAKFAST 90 tablet 1  . Multiple Vitamins-Minerals (SENIOR MULTIVITAMIN PLUS) TABS Take 1 tablet by mouth.      . sertraline (ZOLOFT) 100 MG tablet TAKE 1 TABLET(100 MG) BY MOUTH DAILY 90 tablet 1  . telmisartan (MICARDIS) 40 MG tablet Take 1 tablet (40 mg total) by mouth at bedtime. 90 tablet 1  . albuterol (PROAIR HFA) 108 (90 Base) MCG/ACT inhaler Inhale 2 puffs into the lungs every 4 (four) hours as needed for wheezing. 1 Inhaler 2  . fluticasone furoate-vilanterol (BREO ELLIPTA) 100-25 MCG/INH AEPB Inhale 1 puff into the lungs daily. (Patient not taking: Reported on 12/21/2019) 60 each 5   No facility-administered medications prior to visit.    Review of Systems;  Patient denies headache, fevers, malaise, unintentional weight loss, skin rash, eye pain, sinus congestion and sinus pain, sore throat, dysphagia,  hemoptysis , cough, dyspnea, wheezing, chest pain, palpitations, orthopnea, edema, abdominal pain, nausea, melena, diarrhea, constipation, flank pain, dysuria, hematuria, urinary  Frequency, nocturia, numbness, tingling, seizures,  Focal weakness, Loss of consciousness,  Tremor, insomnia, depression, anxiety, and suicidal ideation.  Objective:  BP 112/76   Pulse 68   Temp 97.7 F (36.5 C) (Temporal)   Ht 5\' 4"  (1.626 m)   Wt 165 lb 3.2 oz (74.9 kg)   SpO2 98%   BMI 28.36 kg/m   BP Readings from Last 3 Encounters:  12/21/19 112/76  12/14/19 (!) 164/79  11/18/19 (!) 152/80    Wt Readings from Last 3 Encounters:  12/21/19  165 lb 3.2 oz (74.9 kg)  11/18/19 164 lb 6.4 oz (74.6 kg)  11/18/19 155 lb (70.3 kg)    General appearance: alert, cooperative and appears stated age Ears: normal TM's and elxternal ear canals both ears Throat: lips, mucosa, and tongue normal; teeth and gums normal Neck: no adenopathy, left sided  carotid bruit, supple, symmetrical, trachea midline and thyroid not enlarged, symmetric, no tenderness/mass/nodules Back: symmetric, no curvature. ROM normal. No CVA tenderness. Lungs: clear to auscultation bilaterally Heart: regular rate and rhythm, S1, S2 normal, no murmur, click, rub or gallop Abdomen: soft, non-tender; bowel sounds normal; no masses,  no organomegaly Pulses: 2+ and symmetric Skin: Skin color, texture, turgor normal. No rashes or lesions Lymph nodes: Cervical, supraclavicular, and axillary nodes normal.  Lab Results  Component Value Date   HGBA1C 6.0 11/18/2019   HGBA1C 6.1 12/31/2018   HGBA1C 6.0 11/17/2018    Lab Results  Component Value Date   CREATININE 0.87 11/18/2019   CREATININE 0.72 11/09/2019   CREATININE 0.75 12/31/2018    Lab Results  Component Value Date   WBC 11.9 (H) 11/09/2019   HGB 15.4 (H) 11/09/2019   HCT 46.3 (H) 11/09/2019   PLT 275 11/09/2019   GLUCOSE 113 (H) 11/18/2019   CHOL 264 (H) 11/18/2019   TRIG 363.0 (H) 11/18/2019   HDL 42.50 11/18/2019   LDLDIRECT 179.0 11/18/2019   LDLCALC 81 03/08/2014   ALT 31 11/18/2019   AST 27 11/18/2019   NA 140 11/18/2019   K 4.3 11/18/2019   CL 102 11/18/2019   CREATININE 0.87 11/18/2019   BUN 23 11/18/2019   CO2 28 11/18/2019   TSH 2.01 11/18/2019   INR 0.9 11/09/2019   HGBA1C 6.0 11/18/2019   MICROALBUR 0.7 04/28/2018    No results found.  Assessment & Plan:   Problem List Items Addressed This Visit      Unprioritized   COPD (chronic obstructive pulmonary disease) ([place])    Exam is normal today,  But she used her albuterol MDI this morning for chest tightness  . Refilling albuterol  MDI continue allegra,  May need to add flonase for allergic rhinit       Relevant Medications   albuterol (PROAIR HFA) 108 (90 Base) MCG/ACT inhaler   Mixed hyperlipidemia    Has had statin myalgias on multiple trials.  Willing to try Zetia and/or Repatha      Relevant Medications   ezetimibe (ZETIA) 10 MG tablet   Other Relevant Orders   Lipid panel   Hypertension    Well controlled on current regimen. Renal function stable, no changes today.      Relevant Medications   ezetimibe (ZETIA) 10 MG tablet   Diabetes mellitus with neuropathy ([place])    Up to date on foot exam. overdue for eye exam      Relevant Orders   Comprehensive metabolic panel   Hemoglobin A1c   Inflammatory polyarthritis ([place]) - Primary    Episodes have been frequent.  Referral to Rheumatology       Relevant Orders   Ambulatory referral  to Rheumatology   Sedimentation rate   Hypothyroidism      I am having Nilda Simmer. Clay start on ezetimibe. I am also having her maintain her Senior Multivitamin Plus, fexofenadine, cholecalciferol, lactobacillus acidophilus, fluticasone furoate-vilanterol, sertraline, telmisartan, levothyroxine, hydrochlorothiazide, celecoxib, gabapentin, and albuterol.  Meds ordered this encounter  Medications  . ezetimibe (ZETIA) 10 MG tablet    Sig: Take 1 tablet (10 mg total) by mouth daily.    Dispense:  30 tablet    Refill:  6  . albuterol (PROAIR HFA) 108 (90 Base) MCG/ACT inhaler    Sig: Inhale 2 puffs into the lungs every 4 (four) hours as needed for wheezing.    Dispense:  8 g    Refill:  5    Medications Discontinued During This Encounter  Medication Reason  . albuterol (PROAIR HFA) 108 (90 Base) MCG/ACT inhaler Reorder    Follow-up: Return in about 2 months (around 02/20/2020).   Crecencio Mc, MD

## 2019-12-23 DIAGNOSIS — R040 Epistaxis: Secondary | ICD-10-CM | POA: Diagnosis not present

## 2019-12-25 ENCOUNTER — Ambulatory Visit
Admission: RE | Admit: 2019-12-25 | Discharge: 2019-12-25 | Disposition: A | Discharge status: Home / Self Care | Payer: Medicare HMO | Source: Ambulatory Visit | Attending: Internal Medicine | Admitting: Internal Medicine

## 2019-12-25 ENCOUNTER — Other Ambulatory Visit: Payer: Self-pay

## 2019-12-25 DIAGNOSIS — R0989 Other specified symptoms and signs involving the circulatory and respiratory systems: Secondary | ICD-10-CM | POA: Diagnosis not present

## 2019-12-25 DIAGNOSIS — I6523 Occlusion and stenosis of bilateral carotid arteries: Secondary | ICD-10-CM | POA: Diagnosis not present

## 2019-12-27 ENCOUNTER — Other Ambulatory Visit: Payer: Self-pay | Admitting: Internal Medicine

## 2019-12-29 NOTE — Assessment & Plan Note (Signed)
<  50% stenosis noted bilaterally on US>  Continue Xetia ,  Add 81 mg asa,  Goal LDL 70

## 2020-01-06 ENCOUNTER — Other Ambulatory Visit: Payer: Self-pay

## 2020-01-06 ENCOUNTER — Ambulatory Visit: Payer: Medicare HMO | Admitting: Orthotics

## 2020-01-06 DIAGNOSIS — M2010 Hallux valgus (acquired), unspecified foot: Secondary | ICD-10-CM

## 2020-01-06 DIAGNOSIS — L608 Other nail disorders: Secondary | ICD-10-CM

## 2020-01-06 NOTE — Progress Notes (Signed)

## 2020-01-12 DIAGNOSIS — Z79899 Other long term (current) drug therapy: Secondary | ICD-10-CM | POA: Diagnosis not present

## 2020-01-12 DIAGNOSIS — M170 Bilateral primary osteoarthritis of knee: Secondary | ICD-10-CM | POA: Diagnosis not present

## 2020-01-12 DIAGNOSIS — M1990 Unspecified osteoarthritis, unspecified site: Secondary | ICD-10-CM | POA: Diagnosis not present

## 2020-01-12 DIAGNOSIS — Z1159 Encounter for screening for other viral diseases: Secondary | ICD-10-CM | POA: Diagnosis not present

## 2020-01-27 ENCOUNTER — Telehealth: Payer: Self-pay | Admitting: Internal Medicine

## 2020-01-27 ENCOUNTER — Other Ambulatory Visit: Payer: Self-pay | Admitting: Internal Medicine

## 2020-01-27 MED ORDER — SERTRALINE HCL 100 MG PO TABS: 100.0000 mg | ORAL_TABLET | Freq: Every day | ORAL | 1 refills | Status: DC

## 2020-01-27 NOTE — Telephone Encounter (Signed)
Medication has been refilled.

## 2020-01-27 NOTE — Telephone Encounter (Signed)
Pt call said she need a refill on sertraline (ZOLOFT) 100 MG tablet she has been out for 3 days couldn't get from pharmacy.

## 2020-02-01 DIAGNOSIS — M170 Bilateral primary osteoarthritis of knee: Secondary | ICD-10-CM | POA: Diagnosis not present

## 2020-02-17 ENCOUNTER — Telehealth: Payer: Self-pay | Admitting: Internal Medicine

## 2020-02-17 MED ORDER — TELMISARTAN 40 MG PO TABS: 40.0000 mg | ORAL_TABLET | Freq: Every day | ORAL | 1 refills | Status: DC

## 2020-02-17 NOTE — Telephone Encounter (Signed)
Pt called in need refill on telmisartan (MICARDIS) 40 MG tablet

## 2020-02-18 ENCOUNTER — Other Ambulatory Visit (INDEPENDENT_AMBULATORY_CARE_PROVIDER_SITE_OTHER): Payer: Medicare HMO

## 2020-02-18 ENCOUNTER — Other Ambulatory Visit: Payer: Self-pay

## 2020-02-18 DIAGNOSIS — E782 Mixed hyperlipidemia: Secondary | ICD-10-CM | POA: Diagnosis not present

## 2020-02-18 DIAGNOSIS — E1140 Type 2 diabetes mellitus with diabetic neuropathy, unspecified: Secondary | ICD-10-CM | POA: Diagnosis not present

## 2020-02-18 DIAGNOSIS — M064 Inflammatory polyarthropathy: Secondary | ICD-10-CM

## 2020-02-18 LAB — SEDIMENTATION RATE: Sed Rate: 23 mm/hr (ref 0–30)

## 2020-02-18 LAB — COMPREHENSIVE METABOLIC PANEL
ALT: 24 U/L (ref 0–35)
AST: 23 U/L (ref 0–37)
Albumin: 4.1 g/dL (ref 3.5–5.2)
Alkaline Phosphatase: 71 U/L (ref 39–117)
BUN: 18 mg/dL (ref 6–23)
CO2: 31 mEq/L (ref 19–32)
Calcium: 9.7 mg/dL (ref 8.4–10.5)
Chloride: 101 mEq/L (ref 96–112)
Creatinine, Ser: 0.76 mg/dL (ref 0.40–1.20)
GFR: 74.57 mL/min (ref >60.00)
Glucose, Bld: 97 mg/dL (ref 70–99)
Potassium: 4.1 mEq/L (ref 3.5–5.1)
Sodium: 139 mEq/L (ref 135–145)
Total Bilirubin: 0.5 mg/dL (ref 0.2–1.2)
Total Protein: 7.1 g/dL (ref 6.0–8.3)

## 2020-02-18 LAB — LIPID PANEL
Cholesterol: 207 mg/dL — ABNORMAL HIGH (ref 0–200)
HDL: 50.7 mg/dL (ref >39.00)
LDL Cholesterol: 125 mg/dL — ABNORMAL HIGH (ref 0–99)
NonHDL: 156.23
Total CHOL/HDL Ratio: 4
Triglycerides: 155 mg/dL — ABNORMAL HIGH (ref 0.0–149.0)
VLDL: 31 mg/dL (ref 0.0–40.0)

## 2020-02-18 LAB — MICROALBUMIN / CREATININE URINE RATIO
Creatinine,U: 128.6 mg/dL
Microalb Creat Ratio: 0.5 mg/g (ref 0.0–30.0)
Microalb, Ur: <0.7 mg/dL (ref 0.0–1.9)

## 2020-02-18 LAB — HEMOGLOBIN A1C: Hgb A1c MFr Bld: 6.1 % (ref 4.6–6.5)

## 2020-02-22 ENCOUNTER — Other Ambulatory Visit: Payer: Self-pay

## 2020-02-22 ENCOUNTER — Ambulatory Visit (INDEPENDENT_AMBULATORY_CARE_PROVIDER_SITE_OTHER): Payer: Medicare HMO | Admitting: Internal Medicine

## 2020-02-22 ENCOUNTER — Encounter: Payer: Self-pay | Admitting: Internal Medicine

## 2020-02-22 VITALS — BP 132/66 | HR 65 | Temp 96.6°F | Resp 14 | Ht 64.0 in | Wt 163.2 lb

## 2020-02-22 DIAGNOSIS — T466X5A Adverse effect of antihyperlipidemic and antiarteriosclerotic drugs, initial encounter: Secondary | ICD-10-CM

## 2020-02-22 DIAGNOSIS — I10 Essential (primary) hypertension: Secondary | ICD-10-CM | POA: Diagnosis not present

## 2020-02-22 DIAGNOSIS — M064 Inflammatory polyarthropathy: Secondary | ICD-10-CM

## 2020-02-22 DIAGNOSIS — M791 Myalgia, unspecified site: Secondary | ICD-10-CM | POA: Diagnosis not present

## 2020-02-22 DIAGNOSIS — E1140 Type 2 diabetes mellitus with diabetic neuropathy, unspecified: Secondary | ICD-10-CM

## 2020-02-22 DIAGNOSIS — M1711 Unilateral primary osteoarthritis, right knee: Secondary | ICD-10-CM

## 2020-02-22 DIAGNOSIS — I6523 Occlusion and stenosis of bilateral carotid arteries: Secondary | ICD-10-CM

## 2020-02-22 MED ORDER — OMEPRAZOLE 40 MG PO CPDR
40.0000 mg | DELAYED_RELEASE_CAPSULE | Freq: Every day | ORAL | 3 refills | Status: DC
Start: 2020-02-22 — End: 2020-05-20

## 2020-02-22 NOTE — Assessment & Plan Note (Signed)
Given evidence of atherosclerosis on plain films she has agreed to trial of Zetia and is tolerating the medication

## 2020-02-22 NOTE — Assessment & Plan Note (Signed)
Reminder for annual diabetic eye exam given..  Foot exam up to date done. Meds reviewed and she is on a baby aspirin daily., ezitimibe and an ACE inhibitor.  Urine tested for protein.   Lab Results  Component Value Date   HGBA1C 6.1 02/18/2020   Lab Results  Component Value Date   MICROALBUR <0.7 02/18/2020   MICROALBUR 0.7 04/28/2018

## 2020-02-22 NOTE — Assessment & Plan Note (Signed)
Managed with placquenil following steroid taper prescribed by Rheumatology.   Today's ESR is normal

## 2020-02-22 NOTE — Assessment & Plan Note (Signed)
Well controlled on current regimen. Renal function stable, no changes today.  Lab Results  Component Value Date   CREATININE 0.76 02/18/2020   Lab Results  Component Value Date   NA 139 02/18/2020   K 4.1 02/18/2020   CL 101 02/18/2020   CO2 31 02/18/2020

## 2020-02-22 NOTE — Assessment & Plan Note (Signed)
Continue celebrex qod,  Adding PPI.  Advised to add tylenol 1000 mg bid

## 2020-02-22 NOTE — Assessment & Plan Note (Signed)
Tolerating Zetia with good results

## 2020-02-22 NOTE — Progress Notes (Signed)
Subjective:  Patient ID: [name] Day, female    DOB: 02/21/1946  Age: 74 y.o. MRN: [id number]  CC: The primary encounter diagnosis was Type 2 diabetes mellitus with diabetic neuropathy, without long-term current use of insulin ([place]). Diagnoses of Primary osteoarthritis of right knee, Inflammatory polyarthritis ([place]), Essential hypertension, Myalgia due to statin, and Carotid stenosis, asymptomatic, bilateral were also pertinent to this visit.  HPI [name] Day presents for follow up on multiple issues  1) polyarthritis:  Was referred to Dr Meda Coffee and plaquenil restarted.  Prednisone taper made ALL JOINTS stop aching "I want to stay on prednisone."  2) DM Type 2:   She  feels generally well,  But is not  exercising regularly or trying to lose weight. Checking  blood sugars less than once daily at variable times, usually only if she feels she may be having a hypoglycemic event. .  BS have been under 130 fasting and < 150 post prandially.  Denies any recent hypoglyemic events.  Taking   medications as directed. Following a carbohydrate modified diet 6 days per week. Denies numbness, burning and tingling of extremities. Appetite is good.   3) hyperlipidemia:  Tolerating Zetia.  History of statin myalgias.   4) right knee OA/DJD :  Pain is constant but not limiting her activity yet   Last steroid injection one year ago .  Does not want surgery although she has advanced disease   Outpatient Medications Prior to Visit  Medication Sig Dispense Refill  . albuterol (PROAIR HFA) 108 (90 Base) MCG/ACT inhaler Inhale 2 puffs into the lungs every 4 (four) hours as needed for wheezing. 8 g 5  . celecoxib (CELEBREX) 200 MG capsule TAKE 1 CAPSULE(200 MG) BY MOUTH DAILY 30 capsule 2  . cholecalciferol (VITAMIN D) 400 UNITS TABS tablet Take 400 Units by mouth.    . ezetimibe (ZETIA) 10 MG tablet Take 1 tablet (10 mg total) by mouth daily. 30 tablet 6  . fexofenadine (ALLEGRA) 180 MG tablet Take 180 mg  by mouth daily.    Marland Kitchen gabapentin (NEURONTIN) 300 MG capsule TAKE ONE CAPSULE BY MOUTH WITH BREAKFAST AND LUNCH, AND 2 CAPSULES EVERY NIGHT AT BEDTIME 120 capsule 1  . hydrochlorothiazide (HYDRODIURIL) 25 MG tablet TAKE 1 TABLET BY MOUTH EVERY DAY *NO REFILLS ON TELMISARTAN AT WAG* 90 tablet 1  . hydroxychloroquine (PLAQUENIL) 200 MG tablet Take 200 mg by mouth daily.     Marland Kitchen lactobacillus acidophilus (BACID) TABS tablet Take 1 tablet by mouth daily.    Marland Kitchen levothyroxine (SYNTHROID) 137 MCG tablet TAKE 1 TABLET BY MOUTH DAILY BEFORE BREAKFAST 90 tablet 1  . Multiple Vitamins-Minerals (SENIOR MULTIVITAMIN PLUS) TABS Take 1 tablet by mouth.      . sertraline (ZOLOFT) 100 MG tablet Take 1 tablet (100 mg total) by mouth daily. 90 tablet 1  . telmisartan (MICARDIS) 40 MG tablet Take 1 tablet (40 mg total) by mouth at bedtime. 90 tablet 1  . fluticasone furoate-vilanterol (BREO ELLIPTA) 100-25 MCG/INH AEPB Inhale 1 puff into the lungs daily. (Patient not taking: Reported on 12/21/2019) 60 each 5   No facility-administered medications prior to visit.    Review of Systems;  Patient denies headache, fevers, malaise, unintentional weight loss, skin rash, eye pain, sinus congestion and sinus pain, sore throat, dysphagia,  hemoptysis , cough, dyspnea, wheezing, chest pain, palpitations, orthopnea, edema, abdominal pain, nausea, melena, diarrhea, constipation, flank pain, dysuria, hematuria, urinary  Frequency, nocturia, numbness, tingling, seizures,  Focal weakness, Loss of  consciousness,  Tremor, insomnia, depression, anxiety, and suicidal ideation.      Objective:  BP 132/66 (BP Location: Left Arm, Patient Position: Sitting, Cuff Size: Normal)   Pulse 65   Temp (!) 96.6 F (35.9 C) (Temporal)   Resp 14   Ht '5\' 4"'  (1.626 m)   Wt 163 lb 3.2 oz (74 kg)   SpO2 94%   BMI 28.01 kg/m   BP Readings from Last 3 Encounters:  02/22/20 132/66  12/21/19 112/76  12/14/19 (!) 164/79    Wt Readings from Last 3  Encounters:  02/22/20 163 lb 3.2 oz (74 kg)  12/21/19 165 lb 3.2 oz (74.9 kg)  11/18/19 164 lb 6.4 oz (74.6 kg)    General appearance: alert, cooperative and appears stated age Ears: normal TM's and external ear canals both ears Throat: lips, mucosa, and tongue normal; teeth and gums normal Neck: no adenopathy, no carotid bruit, supple, symmetrical, trachea midline and thyroid not enlarged, symmetric, no tenderness/mass/nodules Back: symmetric, no curvature. ROM normal. No CVA tenderness. Lungs: clear to auscultation bilaterally Heart: regular rate and rhythm, S1, S2 normal, no murmur, click, rub or gallop Abdomen: soft, non-tender; bowel sounds normal; no masses,  no organomegaly Pulses: 2+ and symmetric Skin: Skin color, texture, turgor normal. No rashes or lesions Lymph nodes: Cervical, supraclavicular, and axillary nodes normal.  Lab Results  Component Value Date   HGBA1C 6.1 02/18/2020   HGBA1C 6.0 11/18/2019   HGBA1C 6.1 12/31/2018    Lab Results  Component Value Date   CREATININE 0.76 02/18/2020   CREATININE 0.87 11/18/2019   CREATININE 0.72 11/09/2019    Lab Results  Component Value Date   WBC 11.9 (H) 11/09/2019   HGB 15.4 (H) 11/09/2019   HCT 46.3 (H) 11/09/2019   PLT 275 11/09/2019   GLUCOSE 97 02/18/2020   CHOL 207 (H) 02/18/2020   TRIG 155.0 (H) 02/18/2020   HDL 50.70 02/18/2020   LDLDIRECT 179.0 11/18/2019   LDLCALC 125 (H) 02/18/2020   ALT 24 02/18/2020   AST 23 02/18/2020   NA 139 02/18/2020   K 4.1 02/18/2020   CL 101 02/18/2020   CREATININE 0.76 02/18/2020   BUN 18 02/18/2020   CO2 31 02/18/2020   TSH 2.01 11/18/2019   INR 0.9 11/09/2019   HGBA1C 6.1 02/18/2020   MICROALBUR <0.7 02/18/2020    US Carotid Duplex Bilateral  Result Date: 12/26/2019 CLINICAL DATA:  Bilateral carotid bruit. Dizziness, visual disturbance. Hypertension, hyperlipidemia, diabetes, previous tobacco abuse. EXAM: BILATERAL CAROTID DUPLEX ULTRASOUND TECHNIQUE: Pearline Cables  scale imaging, color Doppler and duplex ultrasound were performed of bilateral carotid and vertebral arteries in the neck. COMPARISON:  None. FINDINGS: Criteria: Quantification of carotid stenosis is based on velocity parameters that correlate the residual internal carotid diameter with NASCET-based stenosis levels, using the diameter of the distal internal carotid lumen as the denominator for stenosis measurement. The following velocity measurements were obtained: RIGHT ICA: 181/56 cm/sec CCA: 46/80 cm/sec SYSTOLIC ICA/CCA RATIO:  1.9 ECA: 170 cm/sec LEFT ICA: 237/76 cm/sec CCA: 32/12 cm/sec SYSTOLIC ICA/CCA RATIO:  2.6 ECA: 145 cm/sec RIGHT CAROTID ARTERY: Eccentric partially calcified plaque in the bulb and proximal ICA without high-grade stenosis. Distal tortuosity. Recorded elevated peak systolic velocities probably inaccurate due to poor angle correction. RIGHT VERTEBRAL ARTERY:  Normal flow direction and waveform. LEFT CAROTID ARTERY: Smooth noncalcified plaque in the bulb. No high-grade stenosis. Moderate tortuosity of the ICA. Poor angle correction limits accuracy of recorded distal velocities. LEFT VERTEBRAL ARTERY:  Normal flow  direction and waveform. IMPRESSION: 1. Bilateral carotid bifurcation and proximal ICA plaque resulting in less than 50% diameter stenosis. 2. Moderate tortuosity of distal internal carotid arteries bilaterally. 3.  Antegrade bilateral vertebral arterial flow. Electronically Signed   By: Lucrezia Europe M.D.   On: 12/26/2019 10:03    Assessment & Plan:   Problem List Items Addressed This Visit      Unprioritized   Carotid stenosis, asymptomatic, bilateral     Given evidence of atherosclerosis on plain films she has agreed to trial of Zetia and is tolerating the medication      Diabetes mellitus with neuropathy ([place]) - Primary    Reminder for annual diabetic eye exam given..  Foot exam up to date done. Meds reviewed and she is on a baby aspirin daily., ezitimibe and an ACE  inhibitor.  Urine tested for protein.   Lab Results  Component Value Date   HGBA1C 6.1 02/18/2020   Lab Results  Component Value Date   MICROALBUR <0.7 02/18/2020   MICROALBUR 0.7 04/28/2018           Relevant Orders   Hemoglobin A1c   Microalbumin / creatinine urine ratio   Lipid panel   Comprehensive metabolic panel   Hypertension    Well controlled on current regimen. Renal function stable, no changes today.  Lab Results  Component Value Date   CREATININE 0.76 02/18/2020   Lab Results  Component Value Date   NA 139 02/18/2020   K 4.1 02/18/2020   CL 101 02/18/2020   CO2 31 02/18/2020         Inflammatory polyarthritis ([place])    Managed with placquenil following steroid taper prescribed by Rheumatology.   Today's ESR is normal       Relevant Medications   hydroxychloroquine (PLAQUENIL) 200 MG tablet   Myalgia due to statin    Tolerating Zetia with good results       Right knee DJD    Continue celebrex qod,  Adding PPI.  Advised to add tylenol 1000 mg bid       Relevant Medications   hydroxychloroquine (PLAQUENIL) 200 MG tablet      I have discontinued [name] Day's fluticasone furoate-vilanterol. I am also having her start on omeprazole. Additionally, I am having her maintain her Senior Multivitamin Plus, fexofenadine, cholecalciferol, lactobacillus acidophilus, levothyroxine, hydrochlorothiazide, ezetimibe, albuterol, gabapentin, celecoxib, sertraline, telmisartan, and hydroxychloroquine.  Meds ordered this encounter  Medications  . omeprazole (PRILOSEC) 40 MG capsule    Sig: Take 1 capsule (40 mg total) by mouth daily.    Dispense:  30 capsule    Refill:  3    Medications Discontinued During This Encounter  Medication Reason  . fluticasone furoate-vilanterol (BREO ELLIPTA) 100-25 MCG/INH AEPB Completed Course    Follow-up: Return in about 6 months (around 08/23/2020).   Crecencio Mc, MD

## 2020-02-22 NOTE — Patient Instructions (Signed)
I recommend using omeprazole  ,  Either in 20 mg or 40 mg daily or every other day  dose to protect your stomach, since you are taking the celebrex on a  Regular basis     You can add up to 2000 mg of acetominophen (tylenol) every day safely  In divided doses (500 mg every 6 hours  Or 1000 mg every 12 hours.)   Your diabetes remains under excellent control and your cholesterol and other labs are also normal. Please continue your current medications. return in 6 months for follow up on diabetes and make sure  your eye doctor sends me a report of  Your diilated retina exam to monitor for diabetic retinopathy

## 2020-02-24 ENCOUNTER — Ambulatory Visit: Payer: Medicare HMO | Admitting: Orthotics

## 2020-02-26 DIAGNOSIS — Z79899 Other long term (current) drug therapy: Secondary | ICD-10-CM | POA: Diagnosis not present

## 2020-02-26 DIAGNOSIS — M1990 Unspecified osteoarthritis, unspecified site: Secondary | ICD-10-CM | POA: Diagnosis not present

## 2020-02-26 LAB — HM DIABETES EYE EXAM

## 2020-03-03 ENCOUNTER — Other Ambulatory Visit: Payer: Self-pay | Admitting: Internal Medicine

## 2020-03-16 ENCOUNTER — Ambulatory Visit: Payer: Medicare HMO | Admitting: Orthotics

## 2020-03-16 ENCOUNTER — Other Ambulatory Visit: Payer: Self-pay

## 2020-03-16 DIAGNOSIS — M2011 Hallux valgus (acquired), right foot: Secondary | ICD-10-CM | POA: Diagnosis not present

## 2020-03-16 DIAGNOSIS — E1140 Type 2 diabetes mellitus with diabetic neuropathy, unspecified: Secondary | ICD-10-CM | POA: Diagnosis not present

## 2020-03-16 DIAGNOSIS — M2012 Hallux valgus (acquired), left foot: Secondary | ICD-10-CM | POA: Diagnosis not present

## 2020-03-17 ENCOUNTER — Ambulatory Visit: Payer: Medicare HMO | Admitting: Podiatry

## 2020-04-01 ENCOUNTER — Other Ambulatory Visit: Payer: Self-pay | Admitting: Internal Medicine

## 2020-04-01 DIAGNOSIS — E039 Hypothyroidism, unspecified: Secondary | ICD-10-CM

## 2020-04-18 ENCOUNTER — Other Ambulatory Visit: Payer: Self-pay | Admitting: Internal Medicine

## 2020-05-07 ENCOUNTER — Other Ambulatory Visit: Payer: Self-pay | Admitting: Internal Medicine

## 2020-05-20 ENCOUNTER — Other Ambulatory Visit: Payer: Self-pay | Admitting: Internal Medicine

## 2020-06-16 ENCOUNTER — Other Ambulatory Visit: Payer: Self-pay | Admitting: Internal Medicine

## 2020-07-11 ENCOUNTER — Other Ambulatory Visit: Payer: Self-pay | Admitting: Internal Medicine

## 2020-07-22 ENCOUNTER — Other Ambulatory Visit: Payer: Self-pay | Admitting: Internal Medicine

## 2020-08-10 ENCOUNTER — Other Ambulatory Visit: Payer: Self-pay | Admitting: Internal Medicine

## 2020-08-13 ENCOUNTER — Other Ambulatory Visit: Payer: Self-pay | Admitting: Internal Medicine

## 2020-08-25 ENCOUNTER — Encounter: Payer: Self-pay | Admitting: Internal Medicine

## 2020-08-25 ENCOUNTER — Other Ambulatory Visit: Payer: Self-pay

## 2020-08-25 ENCOUNTER — Telehealth (INDEPENDENT_AMBULATORY_CARE_PROVIDER_SITE_OTHER): Payer: Medicare HMO | Admitting: Internal Medicine

## 2020-08-25 VITALS — HR 81 | Resp 18 | Ht 64.0 in | Wt 158.0 lb

## 2020-08-25 DIAGNOSIS — Z20822 Contact with and (suspected) exposure to covid-19: Secondary | ICD-10-CM | POA: Diagnosis not present

## 2020-08-25 DIAGNOSIS — R6889 Other general symptoms and signs: Secondary | ICD-10-CM

## 2020-08-25 DIAGNOSIS — R0989 Other specified symptoms and signs involving the circulatory and respiratory systems: Secondary | ICD-10-CM

## 2020-08-25 LAB — POCT INFLUENZA A/B
Influenza A, POC: NEGATIVE
Influenza B, POC: NEGATIVE

## 2020-08-25 MED ORDER — PREDNISONE 10 MG PO TABS: ORAL_TABLET | ORAL | 0 refills | Status: AC

## 2020-08-25 MED ORDER — LEVOFLOXACIN 500 MG PO TABS: 500.0000 mg | ORAL_TABLET | Freq: Every day | ORAL | 0 refills | Status: AC

## 2020-08-25 NOTE — Progress Notes (Signed)
Your flu test was negative, so you can start the levaquin and prednisone and quarantine until we know the results of your COVID test

## 2020-08-25 NOTE — Progress Notes (Signed)
Telephone Note  This visit type was conducted due to national recommendations for restrictions regarding the COVID-19 pandemic (e.g. social distancing).  This format is felt to be most appropriate for this patient at this time.  All issues noted in this document were discussed and addressed.  No physical exam was performed (except for noted visual exam findings with Video Visits).   I connected with@ on 08/25/20 at  8:30 AM EST by telephone and verified that I am speaking with the correct person using two identifiers. Location patient: home Location provider: work or home office Persons participating in the virtual visit: patient, provider  I discussed the limitations, risks, security and privacy concerns of performing an evaluation and management service by telephone and the availability of in person appointments. I also discussed with the patient that there may be a patient responsible charge related to this service. The patient expressed understanding and agreed to proceed.  Reason for visit: LRI   HPI:  74  Yr old female with COPD presents with 4 days of rhinitis that started last week,  Prior to receiving her  COVID booster,  But  progressed to chest tightness,  headache,   Body aches and rigors,  one day after receiving the COVID booster    Symptoms  Have been present for 5 days.  Temp was 99 pre tylenol  .  Has been taking tylenol for the  rigors,  Feels hot,  Has lost sense of taste, anorexia,  Fatigue is extreme and she reports dyspnea  with exertion accompanied by tachycardia. Urine is darker than usual      ROS: See pertinent positives and negatives per HPI.  Past Medical History:  Diagnosis Date  . allergic rhinitis   . Arthritis    hands  . Cancer ([place])    skin  . COPD (chronic obstructive pulmonary disease) ([place])    PFTS 2013 FEV1 nearly normalized  . Depression   . Diabetes mellitus    diet controlled  . GERD (gastroesophageal reflux disease)   . Headache    sinus  .  Hyperlipidemia   . Hypertension   . Hypothyroid 2002  . Lichen sclerosus et atrophicus of the vulva 01/2011  . Neuropathy    diabetic  . Raynauds disease   . Rheumatic fever    HX    Past Surgical History:  Procedure Laterality Date  . ABDOMINAL HYSTERECTOMY  1978  . BACK SURGERY  2000   repair cord damage after 1st back surgery  . COLONOSCOPY  2004  . ETHMOIDECTOMY Bilateral 01/24/2016   Procedure: TOTAL ETHMOIDECTOMY BILATERAL;  Surgeon: Clyde Canterbury, MD;  Location: [facility];  Service: ENT;  Laterality: Bilateral;  . FRONTAL SINUS EXPLORATION Bilateral 01/24/2016   Procedure: BILATERAL FRONTAL SINUSOTOMY;  Surgeon: Clyde Canterbury, MD;  Location: [facility];  Service: ENT;  Laterality: Bilateral;  Latex sensitivity  . IMAGE GUIDED SINUS SURGERY N/A 01/24/2016   Procedure: IMAGE GUIDED SINUS SURGERY;  Surgeon: Clyde Canterbury, MD;  Location: [facility];  Service: ENT;  Laterality: N/A;  GAVE DISK TO [facility] 3/27  . MAXILLARY ANTROSTOMY Bilateral 01/24/2016   Procedure: MAXILLARY ANTROSTOMY BILATERAL ENDOSCOPICWITH TISSUE REMOVAL;  Surgeon: Clyde Canterbury, MD;  Location: [facility];  Service: ENT;  Laterality: Bilateral;  . SEPTOPLASTY N/A 01/24/2016   Procedure: SEPTOPLASTY NASAL;  Surgeon: Clyde Canterbury, MD;  Location: [facility];  Service: ENT;  Laterality: N/A;  . SPINE SURGERY     L3 to L5  .  TONSILLECTOMY      Family History  Problem Relation Age of Onset  . Heart disease Mother        CHF and cardiomyopathy  . Thyroid disease Mother   . Hyperlipidemia Mother   . Hyperlipidemia Father   . Cancer Father        liver  . Lupus Sister   . Hyperlipidemia Sister   . Breast cancer Neg Hx     SOCIAL HX:  reports that she quit smoking about 26 years ago. Her smoking use included cigarettes. She has a 12.50 pack-year smoking history. She has never used smokeless tobacco. She reports that she does not drink alcohol and does not use drugs.   Current  Outpatient Medications:  .  albuterol (PROAIR HFA) 108 (90 Base) MCG/ACT inhaler, Inhale 2 puffs into the lungs every 4 (four) hours as needed for wheezing., Disp: 8 g, Rfl: 5 .  celecoxib (CELEBREX) 200 MG capsule, TAKE 1 CAPSULE(200 MG) BY MOUTH DAILY, Disp: 30 capsule, Rfl: 2 .  cholecalciferol (VITAMIN D) 400 UNITS TABS tablet, Take 400 Units by mouth., Disp: , Rfl:  .  ezetimibe (ZETIA) 10 MG tablet, TAKE 1 TABLET BY MOUTH EVERY DAY, Disp: 90 tablet, Rfl: 2 .  fexofenadine (ALLEGRA) 180 MG tablet, Take 180 mg by mouth daily., Disp: , Rfl:  .  gabapentin (NEURONTIN) 300 MG capsule, TAKE ONE CAPSULE BY MOUTH WITH BREAKFAST AND LUNCH, AND 2 CAPSULES EVERY NIGHT AT BEDTIME, Disp: 120 capsule, Rfl: 1 .  hydrochlorothiazide (HYDRODIURIL) 25 MG tablet, TAKE 1 TABLET BY MOUTH EVERY DAY *NO REFILLS ON TELMISARTAN AT WAG*, Disp: 90 tablet, Rfl: 1 .  hydroxychloroquine (PLAQUENIL) 200 MG tablet, Take 200 mg by mouth daily. , Disp: , Rfl:  .  lactobacillus acidophilus (BACID) TABS tablet, Take 1 tablet by mouth daily., Disp: , Rfl:  .  levothyroxine (SYNTHROID) 137 MCG tablet, TAKE 1 TABLET BY MOUTH DAILY BEFORE BREAKFAST, Disp: 90 tablet, Rfl: 1 .  omeprazole (PRILOSEC) 40 MG capsule, TAKE 1 CAPSULE BY MOUTH EVERY DAY, Disp: 90 capsule, Rfl: 1 .  sertraline (ZOLOFT) 100 MG tablet, TAKE 1 TABLET BY MOUTH EVERY DAY, Disp: 90 tablet, Rfl: 1 .  telmisartan (MICARDIS) 40 MG tablet, TAKE 1 TABLET BY MOUTH EVERYDAY AT BEDTIME, Disp: 90 tablet, Rfl: 1 .  levofloxacin (LEVAQUIN) 500 MG tablet, Take 1 tablet (500 mg total) by mouth daily., Disp: 7 tablet, Rfl: 0 .  predniSONE (DELTASONE) 10 MG tablet, 6 tablets daily for 3 days, then reduce by 1 tablet daily until gone, Disp: 33 tablet, Rfl: 0  EXAM:   General impression: alert, cooperative and articulate.  No signs of being in distress  Lungs: speech is fluent sentence length suggests that patient is not short of breath and not punctuated by cough, sneezing or  sniffing. Marland Kitchen   Psych: affect normal.  speech is articulate and non pressured .  Denies suicidal thoughts    ASSESSMENT AND PLAN:  Discussed the following assessment and plan:  Flu-like symptoms - Plan: POCT Influenza A/B, POCT Influenza A/B  Chest congestion - Plan: Novel Coronavirus, NAA ([facility]), Novel Coronavirus, NAA ([facility])  Suspected COVID-19 virus infection  Suspected COVID-19 virus infection Patient is high risk for decompensation due to age and diagnosis of COPD.  By telephone she does not appear to be in respiratory distress at rest. .  Testingfor covid and flu was negative .    levaquin and prednisone sent to pharmacy .  Advised to consider purchasing a  pulse oximeter given her history of COPD    I discussed the assessment and treatment plan with the patient. The patient was provided an opportunity to ask questions and all were answered. The patient agreed with the plan and demonstrated an understanding of the instructions.   The patient was advised to call back or seek an in-person evaluation if the symptoms worsen or if the condition fails to improve as anticipated.  I provided 22 minutes of non-face-to-face time during this encounter.   Crecencio Mc, MD

## 2020-08-25 NOTE — Assessment & Plan Note (Addendum)
Patient is high risk for decompensation due to age and diagnosis of COPD.  By telephone she does not appear to be in respiratory distress at rest. .  Testingfor covid and flu was negative .    levaquin and prednisone sent to pharmacy .  Advised to consider purchasing a pulse oximeter given her history of COPD

## 2020-08-26 LAB — SARS-COV-2, NAA 2 DAY TAT

## 2020-08-26 LAB — NOVEL CORONAVIRUS, NAA: SARS-CoV-2, NAA: NOT DETECTED

## 2020-08-27 NOTE — Progress Notes (Signed)
I hope you are feeling better.  By now I'm sure you have seen the [place] result.  You can stop quarantining but finish the levaquin and prednisone   Regards,   Deborra Medina, MD

## 2020-09-11 ENCOUNTER — Other Ambulatory Visit: Payer: Self-pay | Admitting: Internal Medicine

## 2020-09-21 DIAGNOSIS — R5381 Other malaise: Secondary | ICD-10-CM | POA: Diagnosis not present

## 2020-09-21 DIAGNOSIS — Z5321 Procedure and treatment not carried out due to patient leaving prior to being seen by health care provider: Secondary | ICD-10-CM | POA: Insufficient documentation

## 2020-09-21 DIAGNOSIS — R531 Weakness: Secondary | ICD-10-CM | POA: Insufficient documentation

## 2020-09-21 DIAGNOSIS — R0902 Hypoxemia: Secondary | ICD-10-CM | POA: Diagnosis not present

## 2020-09-21 DIAGNOSIS — I10 Essential (primary) hypertension: Secondary | ICD-10-CM | POA: Diagnosis not present

## 2020-09-21 NOTE — ED Triage Notes (Signed)
EMS brings pt in from home for generalized weakness and decreased appetitie since 12/3 after getting COVID booster

## 2020-09-22 ENCOUNTER — Telehealth: Payer: Self-pay | Admitting: Internal Medicine

## 2020-09-22 ENCOUNTER — Other Ambulatory Visit: Payer: Self-pay

## 2020-09-22 ENCOUNTER — Encounter: Payer: Self-pay | Admitting: *Deleted

## 2020-09-22 ENCOUNTER — Emergency Department
Admission: EM | Admit: 2020-09-22 | Discharge: 2020-09-22 | Disposition: A | Discharge status: Home / Self Care | Payer: Medicare HMO | Attending: Emergency Medicine | Admitting: Emergency Medicine

## 2020-09-22 LAB — CBC
HCT: 43.5 % (ref 36.0–46.0)
Hemoglobin: 14.5 g/dL (ref 12.0–15.0)
MCH: 27.3 pg (ref 26.0–34.0)
MCHC: 33.3 g/dL (ref 30.0–36.0)
MCV: 81.9 fL (ref 80.0–100.0)
Platelets: 492 10*3/uL — ABNORMAL HIGH (ref 150–400)
RBC: 5.31 MIL/uL — ABNORMAL HIGH (ref 3.87–5.11)
RDW: 14.9 % (ref 11.5–15.5)
WBC: 37.8 10*3/uL — ABNORMAL HIGH (ref 4.0–10.5)
nRBC: 0 % (ref 0.0–0.2)

## 2020-09-22 LAB — BASIC METABOLIC PANEL
Anion gap: 16 — ABNORMAL HIGH (ref 5–15)
BUN: 24 mg/dL — ABNORMAL HIGH (ref 8–23)
CO2: 23 mmol/L (ref 22–32)
Calcium: 9 mg/dL (ref 8.9–10.3)
Chloride: 94 mmol/L — ABNORMAL LOW (ref 98–111)
Creatinine, Ser: 0.7 mg/dL (ref 0.44–1.00)
GFR, Estimated: >60 mL/min (ref >60)
Glucose, Bld: 113 mg/dL — ABNORMAL HIGH (ref 70–99)
Potassium: 4 mmol/L (ref 3.5–5.1)
Sodium: 133 mmol/L — ABNORMAL LOW (ref 135–145)

## 2020-09-22 LAB — LIPASE, BLOOD: Lipase: 29 U/L (ref 11–51)

## 2020-09-22 NOTE — ED Notes (Signed)
No answer when called several times from lobby 

## 2020-09-22 NOTE — ED Triage Notes (Addendum)
Pt reporting weakness x 5 weeks with intermittent fevers, chills and diarrhea. No NV. Pt denies a fever for the past three weeks but reports the most recent episode of diarrhea was yesterday. Pain is localized to the right upper and lower quadrant radiating to her back.

## 2020-09-23 ENCOUNTER — Telehealth: Payer: Self-pay | Admitting: Internal Medicine

## 2020-09-23 DIAGNOSIS — A09 Infectious gastroenteritis and colitis, unspecified: Secondary | ICD-10-CM

## 2020-09-23 NOTE — Telephone Encounter (Signed)
No I cannot make them go to her mychart if that is what she is asking  Her white blood cell ct was very high,  . Please schedule her an office visit asap . Janett Billow was asked to call patient yesterday  Vis secure chat message,  Which is how I was notified that she left the ER before being seen.   She was reportedly having diarrhea and I had treated her on Dec 9 with levaquin and prednisone (which can elevated the white count ) and levquin can cause c dificile colitis  Needs a lab visit today to provide stool samples if still having watery diarrhea

## 2020-09-23 NOTE — Addendum Note (Signed)
Addended by: Crecencio Mc on: 09/23/2020 01:34 PM   Modules accepted: Orders

## 2020-09-23 NOTE — Telephone Encounter (Signed)
Pt called and wanted to know if Dr. Derrel Nip could result her lab results from the hospital

## 2020-09-23 NOTE — Telephone Encounter (Signed)
Message received vi secure chat and copied to chart for record purposes:  [name] Day came to ED yesterday with complaint of weakness, diarrehea and symptoms for several weeks. she left before seeing a provider due to long wait--we continue to hold 25-30 admissions in the ED. Her wbc was very elevated. I am working from home, so I hope you or your staff can follow up with her.

## 2020-09-23 NOTE — Telephone Encounter (Signed)
Left message for patient to return call back.  

## 2020-09-25 ENCOUNTER — Other Ambulatory Visit: Payer: Self-pay | Admitting: Internal Medicine

## 2020-09-25 DIAGNOSIS — E039 Hypothyroidism, unspecified: Secondary | ICD-10-CM

## 2020-09-26 ENCOUNTER — Telehealth: Payer: Self-pay

## 2020-09-26 NOTE — Telephone Encounter (Signed)
Death certificate has been completed and signed by Dr. Derrel Nip. Form has been placed up front to be picked up. [facility] is aware that form will be ready for pick up this afternoon per Dr. Derrel Nip.

## 2020-09-26 NOTE — Telephone Encounter (Signed)
[facility] dropped off pt death certificate to be filled out. Placed in folder up front.

## 2020-10-18 DEATH — deceased

## 2020-11-18 ENCOUNTER — Ambulatory Visit: Payer: Medicare HMO

## 2020-11-19 ENCOUNTER — Other Ambulatory Visit: Payer: Self-pay | Admitting: Internal Medicine

## 2021-09-24 IMAGING — US US CAROTID DUPLEX BILAT
1 series · 13 of 24 positions shown · non-contrast
Comparison: None.

CLINICAL DATA: Bilateral carotid bruit. Dizziness, visual
disturbance. Hypertension, hyperlipidemia, diabetes, previous
tobacco abuse.

EXAM:
BILATERAL CAROTID DUPLEX ULTRASOUND
TECHNIQUE: Gray scale imaging, color Doppler and duplex ultrasound were
performed of bilateral carotid and vertebral arteries in the neck.

[Series 1: us carotid duplex bilat · 0.06mm/px · 13 of 57 slices shown]
[im 1/57]
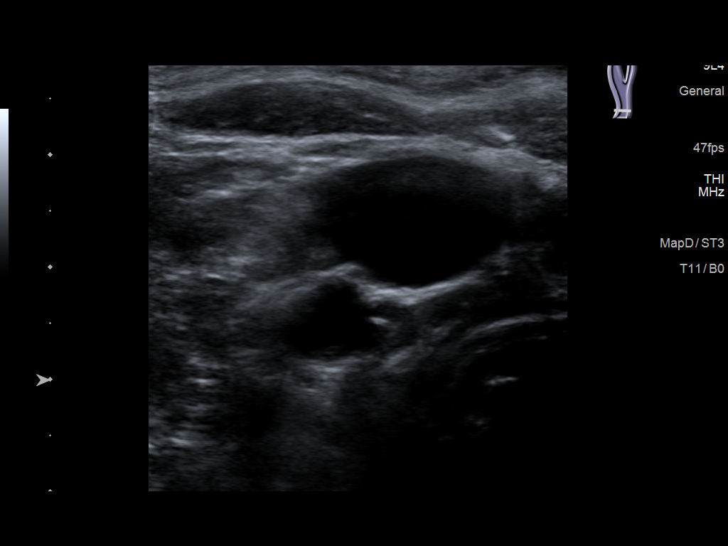
[im 5/57]
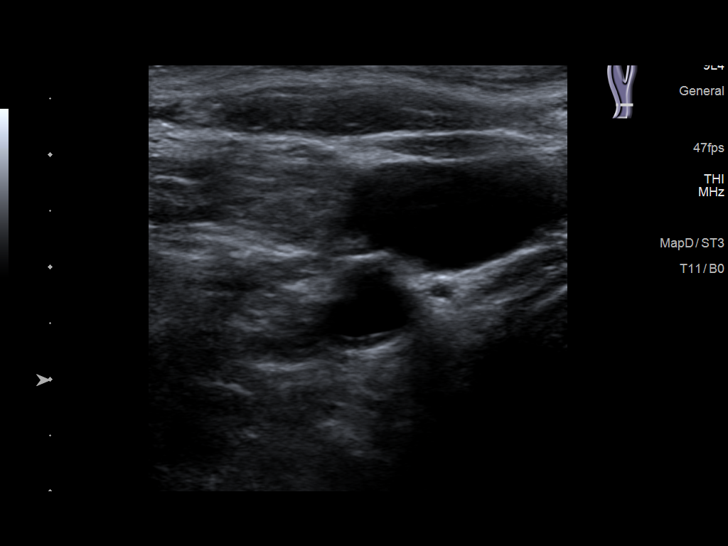
[im 10/57]
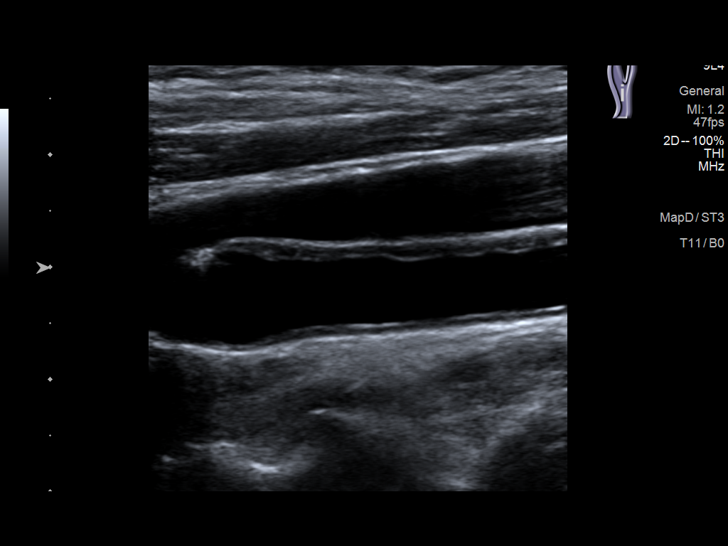
[im 15/57]
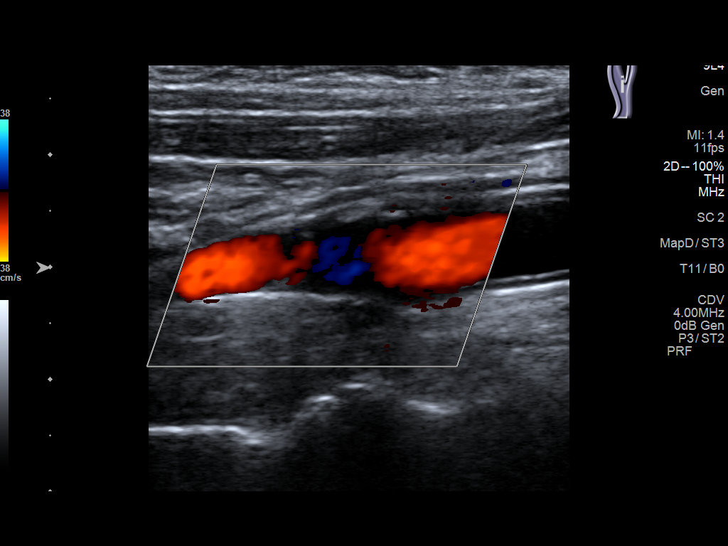
[im 20/57]
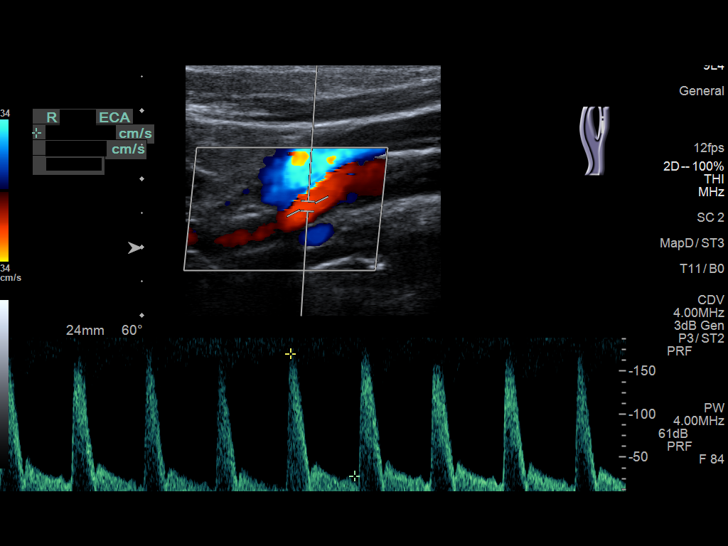
[im 25/57]
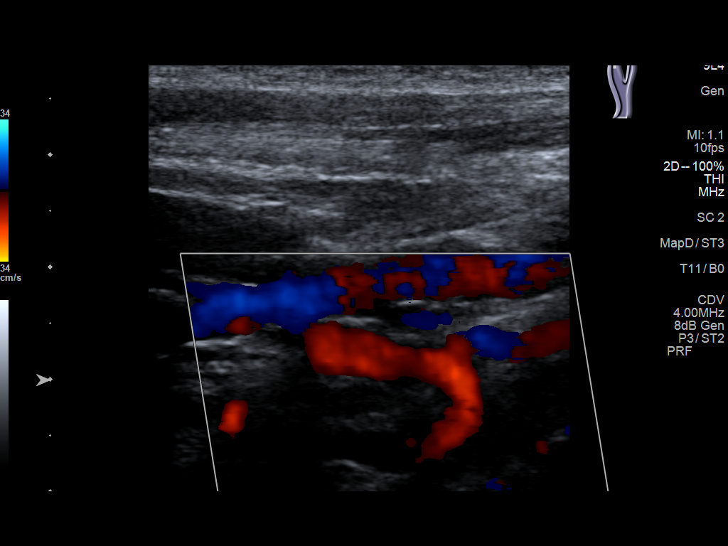
[im 30/57]
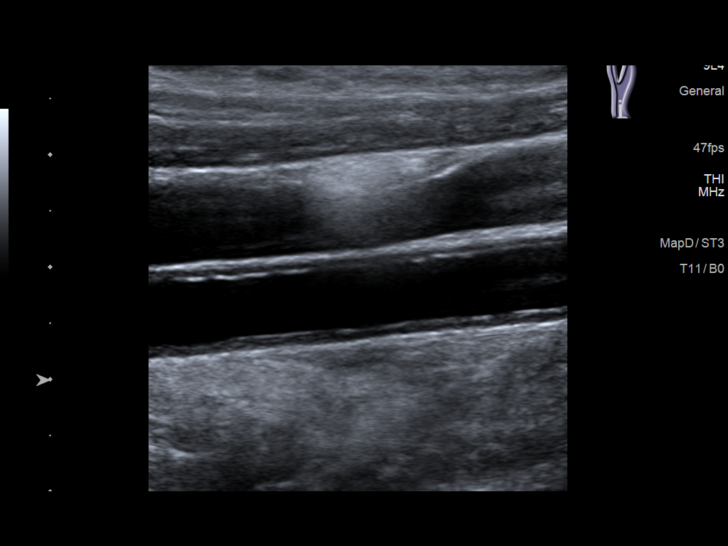
[im 32/57]
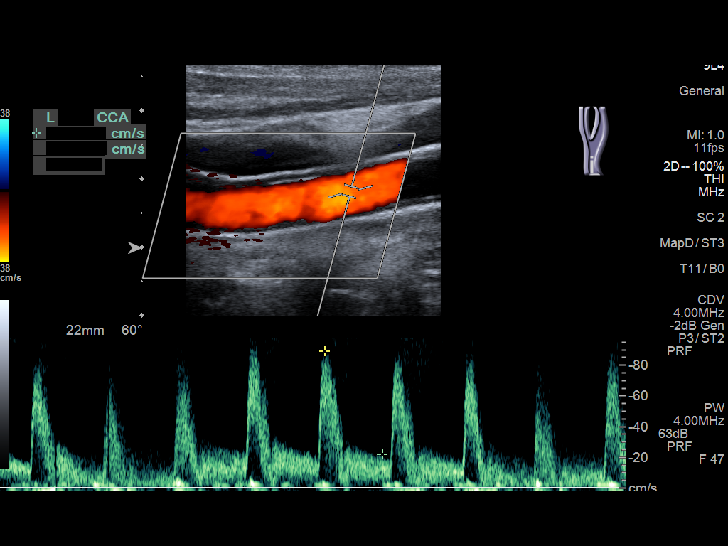
[im 37/57]
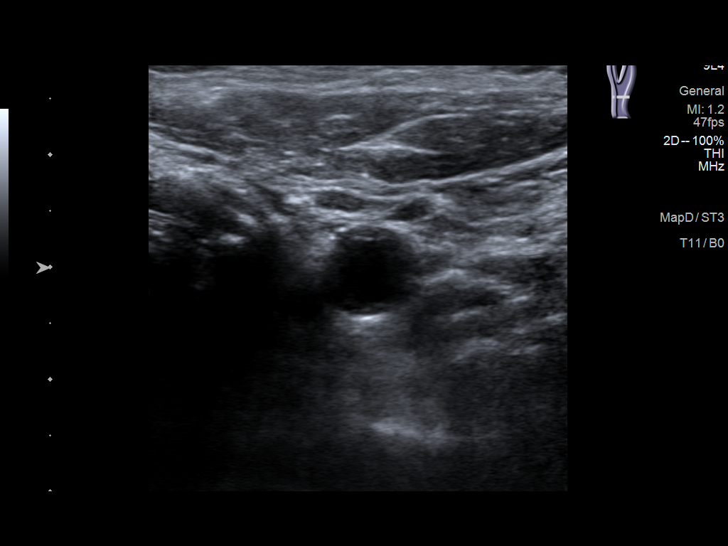
[im 42/57]
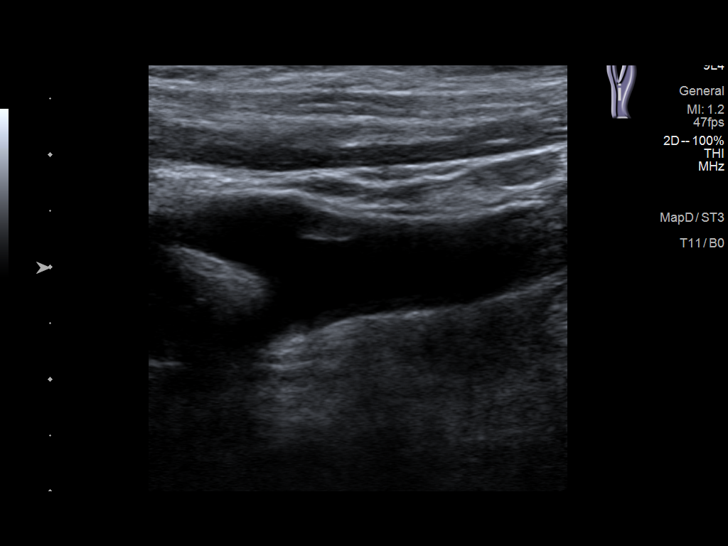
[im 47/57]
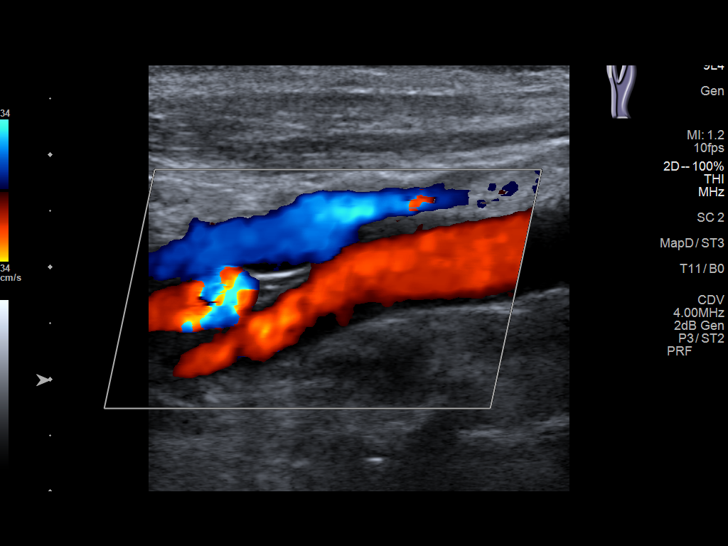
[im 52/57]
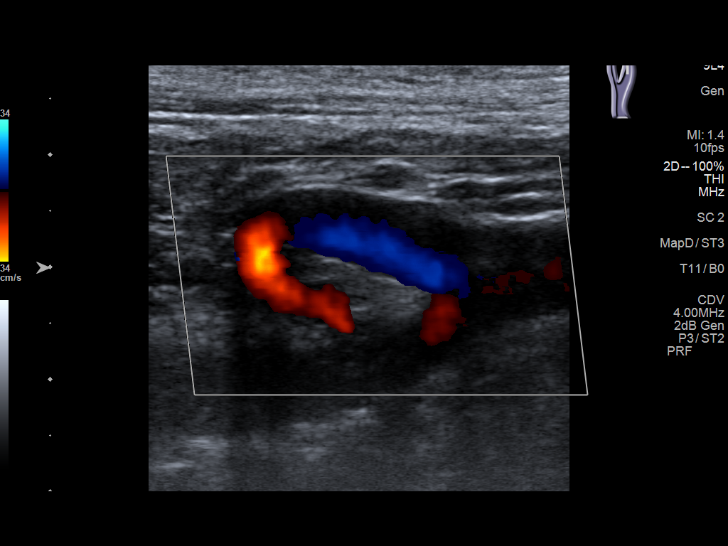
[im 57/57]
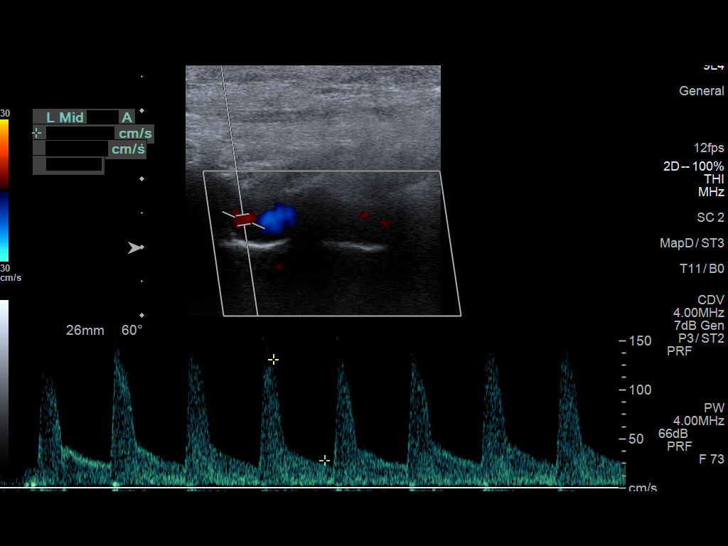

[13 of 24 positions shown; findings below may reference images not displayed]

FINDINGS: Criteria: Quantification of carotid stenosis is based on velocity
parameters that correlate the residual internal carotid diameter
with NASCET-based stenosis levels, using the diameter of the distal
internal carotid lumen as the denominator for stenosis measurement.

The following velocity measurements were obtained:

RIGHT

ICA: 181/56 cm/sec

CCA: 98/21 cm/sec

SYSTOLIC ICA/CCA RATIO:

ECA: 170 cm/sec

LEFT

ICA: 237/76 cm/sec

CCA: 92/18 cm/sec

SYSTOLIC ICA/CCA RATIO:

ECA: 145 cm/sec

RIGHT CAROTID ARTERY: Eccentric partially calcified plaque in the
bulb and proximal ICA without high-grade stenosis. Distal
tortuosity. Recorded elevated peak systolic velocities probably
inaccurate due to poor angle correction.

RIGHT VERTEBRAL ARTERY:  Normal flow direction and waveform.

LEFT CAROTID ARTERY: Smooth noncalcified plaque in the bulb. No
high-grade stenosis. Moderate tortuosity of the ICA. Poor angle
correction limits accuracy of recorded distal velocities.

LEFT VERTEBRAL ARTERY:  Normal flow direction and waveform.
IMPRESSION: 1. Bilateral carotid bifurcation and proximal ICA plaque resulting
in less than 50% diameter stenosis.
2. Moderate tortuosity of distal internal carotid arteries
bilaterally.
3.  Antegrade bilateral vertebral arterial flow.
